# Patient Record
Sex: Male | Born: 1955 | Race: White | Hispanic: No | Marital: Married | Smoking: Current every day smoker
Health system: Southern US, Community
[De-identification: ages and names within clinical notes are randomized; demographics above are authoritative.]

## PROBLEM LIST (undated history)

## (undated) DIAGNOSIS — M1990 Unspecified osteoarthritis, unspecified site: Secondary | ICD-10-CM

## (undated) DIAGNOSIS — I872 Venous insufficiency (chronic) (peripheral): Secondary | ICD-10-CM

## (undated) DIAGNOSIS — N529 Male erectile dysfunction, unspecified: Secondary | ICD-10-CM

## (undated) DIAGNOSIS — E785 Hyperlipidemia, unspecified: Secondary | ICD-10-CM

## (undated) DIAGNOSIS — B059 Measles without complication: Secondary | ICD-10-CM

## (undated) DIAGNOSIS — I10 Essential (primary) hypertension: Secondary | ICD-10-CM

## (undated) DIAGNOSIS — R079 Chest pain, unspecified: Secondary | ICD-10-CM

## (undated) DIAGNOSIS — Z9289 Personal history of other medical treatment: Secondary | ICD-10-CM

## (undated) DIAGNOSIS — B019 Varicella without complication: Secondary | ICD-10-CM

## (undated) DIAGNOSIS — Z972 Presence of dental prosthetic device (complete) (partial): Secondary | ICD-10-CM

## (undated) DIAGNOSIS — B269 Mumps without complication: Secondary | ICD-10-CM

## (undated) DIAGNOSIS — F1910 Other psychoactive substance abuse, uncomplicated: Secondary | ICD-10-CM

## (undated) HISTORY — DX: Mumps without complication: B26.9

## (undated) HISTORY — DX: Chest pain, unspecified: R07.9

## (undated) HISTORY — DX: Male erectile dysfunction, unspecified: N52.9

## (undated) HISTORY — DX: Varicella without complication: B01.9

## (undated) HISTORY — DX: Essential (primary) hypertension: I10

## (undated) HISTORY — DX: Venous insufficiency (chronic) (peripheral): I87.2

## (undated) HISTORY — DX: Personal history of other medical treatment: Z92.89

## (undated) HISTORY — DX: Other psychoactive substance abuse, uncomplicated: F19.10

## (undated) HISTORY — DX: Hyperlipidemia, unspecified: E78.5

## (undated) HISTORY — DX: Measles without complication: B05.9

---
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
## 2005-01-16 ENCOUNTER — Ambulatory Visit: Payer: Self-pay

## 2007-11-21 ENCOUNTER — Emergency Department: Payer: Self-pay | Admitting: Emergency Medicine

## 2015-03-28 ENCOUNTER — Ambulatory Visit (INDEPENDENT_AMBULATORY_CARE_PROVIDER_SITE_OTHER): Payer: No Typology Code available for payment source | Admitting: Family Medicine

## 2015-03-28 ENCOUNTER — Encounter: Payer: Self-pay | Admitting: Family Medicine

## 2015-03-28 VITALS — BP 142/100 | HR 76 | Temp 98.5°F | Resp 16 | Ht 73.0 in | Wt 242.0 lb

## 2015-03-28 DIAGNOSIS — Z23 Encounter for immunization: Secondary | ICD-10-CM | POA: Diagnosis not present

## 2015-03-28 DIAGNOSIS — Z720 Tobacco use: Secondary | ICD-10-CM | POA: Insufficient documentation

## 2015-03-28 DIAGNOSIS — N529 Male erectile dysfunction, unspecified: Secondary | ICD-10-CM

## 2015-03-28 DIAGNOSIS — R9431 Abnormal electrocardiogram [ECG] [EKG]: Secondary | ICD-10-CM

## 2015-03-28 DIAGNOSIS — R079 Chest pain, unspecified: Secondary | ICD-10-CM | POA: Diagnosis not present

## 2015-03-28 DIAGNOSIS — R030 Elevated blood-pressure reading, without diagnosis of hypertension: Secondary | ICD-10-CM | POA: Diagnosis not present

## 2015-03-28 DIAGNOSIS — L409 Psoriasis, unspecified: Secondary | ICD-10-CM | POA: Diagnosis not present

## 2015-03-28 DIAGNOSIS — Z0000 Encounter for general adult medical examination without abnormal findings: Secondary | ICD-10-CM | POA: Diagnosis not present

## 2015-03-28 DIAGNOSIS — IMO0001 Reserved for inherently not codable concepts without codable children: Secondary | ICD-10-CM

## 2015-03-28 NOTE — Patient Instructions (Signed)
Recommend taking 81mg  enteric coated aspirin to reduce risk of vascular events such as heart attacks and strokes.   Screening for lung cancer is recommended for people between 67 and 58 years of age who have smoked 1 pack per day for at least 31 years. Please call our office at [phone] to schedule a low dose CT lung scan for lung cancer screening.

## 2015-03-28 NOTE — Progress Notes (Signed)
Patient: Chase [name], Male    DOB: Nov 17, 1955, 59 y.o.   MRN: [id number] Visit Date: 03/28/2015  Today's Provider: Lelon Huh, MD   Chief Complaint  Patient presents with  . Establish Care  . Erectile Dysfunction   Subjective:    Annual physical exam Chase [name] is a 59 y.o. male who presents today for health maintenance and complete physical. He feels fairly well. He reports exercising rarely. He reports he is sleeping fairly well. -----------------------------------------------------------------  Chest Pain  This is a new problem. The current episode started more than 1 month ago. The onset quality is gradual. The problem occurs every several days. The problem has been unchanged. The pain is present in the substernal region. The pain is at a severity of 3/10. The pain is moderate. The quality of the pain is described as heavy. The pain does not radiate. Associated symptoms include back pain, a cough, dizziness and palpitations. Pertinent negatives include no abdominal pain, diaphoresis, fever, nausea, near-syncope, orthopnea or shortness of breath.     Erectile Dysfunction:  Patient complains of erectile dysfunction.  Onset of dysfunction was several months ago and was gradual in onset.  Patient states the nature of difficulty is maintaining erection. Libido is affected.     Review of Systems  Constitutional: Negative for fever, diaphoresis and fatigue.  HENT: Negative for ear discharge, ear pain and hearing loss.   Eyes: Positive for pain. Negative for discharge, redness and itching.  Respiratory: Positive for cough, chest tightness, wheezing and stridor. Negative for shortness of breath.   Cardiovascular: Positive for chest pain, palpitations and leg swelling. Negative for orthopnea and near-syncope.  Gastrointestinal: Negative for nausea, abdominal pain, diarrhea, constipation and blood in stool.  Musculoskeletal: Positive for back pain, neck pain and neck stiffness.  Skin:        Neurological: Positive for dizziness, tremors and light-headedness.  Hematological: Negative for adenopathy. Does not bruise/bleed easily.  Psychiatric/Behavioral: Negative for sleep disturbance.    Social History He  reports that he has been smoking Cigarettes.  He started smoking about 45 years ago. He has a 90 pack-year smoking history. He does not have any smokeless tobacco history on file. He reports that he drinks alcohol. He reports that he does not use illicit drugs.  There are no active problems to display for this patient.   Past Surgical History  Procedure Laterality Date  . Appendectomy  1972    Family History His family history includes Heart disease in his mother; Pancreatic cancer in his father.    Previous Medications   No medications on file    Patient Care Team: Birdie Sons, MD as PCP - General (Family Medicine)     Objective:   Vitals: BP 142/100 mmHg  Pulse 76  Temp(Src) 98.5 F (36.9 C) (Oral)  Resp 16  Ht 6\' 1"  (1.854 m)  Wt 242 lb (109.77 kg)  BMI 31.93 kg/m2   Physical Exam  General Appearance:    Alert, cooperative, no distress, appears stated age, obese  Head:    Normocephalic, without obvious abnormality, atraumatic  Eyes:    PERRL, conjunctiva/corneas clear, EOM's intact, fundi    benign, both eyes       Ears:    Normal TM's and external ear canals, both ears  Nose:   Nares normal, septum midline, mucosa normal, no drainage   or sinus tenderness  Throat:   Lips, mucosa, and tongue normal; teeth and gums normal  Neck:   Supple, symmetrical,  trachea midline, no adenopathy;       thyroid:  No enlargement/tenderness/nodules; no carotid   bruit or JVD  Back:     Symmetric, no curvature, ROM normal, no CVA tenderness  Lungs:     Clear to auscultation bilaterally, respirations unlabored  Chest wall:    No tenderness or deformity  Heart:    Regular rate and rhythm, S1 and S2 normal, no murmur, rub   or gallop  Abdomen:     Soft,  non-tender, bowel sounds active all four quadrants,    no masses, no organomegaly  Genitalia:    deferred  Rectal:    deferred  Extremities:   Extremities normal, atraumatic, no cyanosis or edema  Pulses:   2+ and symmetric all extremities  Skin:   Skin color, texture, turgor normal, no rashes or lesions except for psoriatic changes on elbows and knees.   Lymph nodes:   Cervical, supraclavicular, and axillary nodes normal  Neurologic:   CNII-XII intact. Normal strength, sensation and reflexes      throughout        Assessment & Plan:     Routine Health Maintenance and Physical Exam    Health Maintenance  Topic Date Due  . HIV Screening  12/13/1971  . TETANUS/TDAP  12/13/1975  . COLONOSCOPY  12/12/2006  . INFLUENZA VACCINE  05/21/2015      Discussed health benefits of physical activity, and encouraged him to engage in regular exercise appropriate for his age and condition.    --------------------------------------------------------------------   1. Annual physical exam  - EKG 27-NTZG - Basic metabolic panel - Lipid panel - TSH - PSA - Ambulatory referral to Gastroenterology  2. Elevated blood pressure See how labs and cardiology goes. Would probably benefit from ACEI if renal functions ar Ok.   3. Erectile dysfunction, unspecified erectile dysfunction type Likely secondary to smoking, hypertension, and suspected vascular disease.  - Testosterone,Free and Total  4. Tobacco abuse Stop smoking. Counseled regarding LDCT.   5. Chest pain, unspecified chest pain type Multiple cardiac risk factors. Start 81mg  ASA.  - Ambulatory referral to Cardiology  6. Abnormal EKG  - Ambulatory referral to Cardiology  7. Need for Tdap vaccination  - Tdap vaccine greater than or equal to 7yo IM

## 2015-03-29 LAB — TESTOSTERONE,FREE AND TOTAL
TESTOSTERONE FREE: 9.3 pg/mL (ref 7.2–24.0)
TESTOSTERONE: 247 ng/dL — AB (ref 348–1197)

## 2015-03-29 LAB — LIPID PANEL
CHOLESTEROL TOTAL: 226 mg/dL — AB (ref 100–199)
Chol/HDL Ratio: 4.8 ratio units (ref 0.0–5.0)
HDL: 47 mg/dL (ref >39)
LDL Calculated: 156 mg/dL — ABNORMAL HIGH (ref 0–99)
Triglycerides: 113 mg/dL (ref 0–149)
VLDL Cholesterol Cal: 23 mg/dL (ref 5–40)

## 2015-03-29 LAB — PSA: PROSTATE SPECIFIC AG, SERUM: 1 ng/mL (ref 0.0–4.0)

## 2015-03-29 LAB — TSH: TSH: 3.01 u[IU]/mL (ref 0.450–4.500)

## 2015-03-30 ENCOUNTER — Telehealth: Payer: Self-pay

## 2015-03-30 NOTE — Telephone Encounter (Signed)
EKG reviewed by Ignacia Bayley, NP. He is in agreeance that pt may wait until 7/19 for appt.

## 2015-03-30 NOTE — Telephone Encounter (Signed)
[facility] called, states pt has abnormal EKG. Please review EKG, closest appt is 7/19.

## 2015-03-31 LAB — RENAL FUNCTION PANEL
Albumin: 4.6 g/dL (ref 3.5–5.5)
BUN/Creatinine Ratio: 9 (ref 9–20)
BUN: 10 mg/dL (ref 6–24)
CO2: 24 mmol/L (ref 18–29)
CREATININE: 1.12 mg/dL (ref 0.76–1.27)
Calcium: 9.4 mg/dL (ref 8.7–10.2)
Chloride: 100 mmol/L (ref 97–108)
GFR calc Af Amer: 83 mL/min/{1.73_m2} (ref >59)
GFR calc non Af Amer: 72 mL/min/{1.73_m2} (ref >59)
GLUCOSE: 80 mg/dL (ref 65–99)
POTASSIUM: 4.7 mmol/L (ref 3.5–5.2)
Phosphorus: 4.7 mg/dL — ABNORMAL HIGH (ref 2.5–4.5)
Sodium: 141 mmol/L (ref 134–144)

## 2015-03-31 LAB — SPECIMEN STATUS REPORT

## 2015-04-03 ENCOUNTER — Telehealth: Payer: Self-pay | Admitting: *Deleted

## 2015-04-03 MED ORDER — LISINOPRIL 10 MG PO TABS: 10.0000 mg | ORAL_TABLET | Freq: Every day | ORAL | Status: DC

## 2015-04-03 NOTE — Telephone Encounter (Signed)
-----   Message from Birdie Sons, MD sent at 03/29/2015  9:28 AM EDT ----- Please add renal panel to labs drawn 03/29/15. Thanks.

## 2015-04-03 NOTE — Telephone Encounter (Signed)
Notes Recorded by [place] M Zoria Rawlinson, CMA on 04/03/2015 at 10:58 AM Patient notified of results. Expressed understanding. Rx sent to [facility]. Notes Recorded by Birdie Sons, MD on 04/01/2015 at 1:57 PM Testosterone levels are a little low, otherwise labs are normal. Recommend he start lisinopril 10mg  daily, #30, rf x 1 due to high blood pressure. Follow up 1 months for BP check. Notes Recorded by Wilder Glade, CMA on 03/30/2015 at 9:44 AM Added renal panel to specimen drawn.

## 2015-04-16 ENCOUNTER — Other Ambulatory Visit: Payer: Self-pay

## 2015-04-16 ENCOUNTER — Telehealth: Payer: Self-pay

## 2015-04-16 NOTE — Telephone Encounter (Signed)
Gastroenterology Pre-Procedure Review  Request Date: 05-21-15 Requesting Physician: Dr. Caryn Section  PATIENT REVIEW QUESTIONS: The patient responded to the following health history questions as indicated:    1. Are you having any GI issues? yes (hemorrhoids) 2. Do you have a personal history of Polyps? no 3. Do you have a family history of Colon Cancer or Polyps? no 4. Diabetes Mellitus? no 5. Joint replacements in the past 12 months?no 6. Major health problems in the past 3 months?no 7. Any artificial heart valves, MVP, or defibrillator?no    MEDICATIONS & ALLERGIES:    Patient reports the following regarding taking any anticoagulation/antiplatelet therapy:   Plavix, Coumadin, Eliquis, Xarelto, Lovenox, Pradaxa, Brilinta, or Effient? no Aspirin? yes (81mg )  Patient confirms/reports the following medications:  Current Outpatient Prescriptions  Medication Sig Dispense Refill  . Omega-3 Fatty Acids (FISH OIL) 1000 MG CAPS Take 1 capsule by mouth.    Marland Kitchen lisinopril (PRINIVIL,ZESTRIL) 10 MG tablet Take 1 tablet (10 mg total) by mouth daily. 30 tablet 1   No current facility-administered medications for this visit.    Patient confirms/reports the following allergies:  No Known Allergies  No orders of the defined types were placed in this encounter.    AUTHORIZATION INFORMATION Primary Insurance: 1D#: Group #:  Secondary Insurance: 1D#: Group #:  SCHEDULE INFORMATION: Date: 05-21-15 Time: Location: [facility]

## 2015-05-08 ENCOUNTER — Encounter: Payer: Self-pay | Admitting: Cardiovascular Disease

## 2015-05-08 ENCOUNTER — Encounter (INDEPENDENT_AMBULATORY_CARE_PROVIDER_SITE_OTHER): Payer: Self-pay

## 2015-05-08 ENCOUNTER — Ambulatory Visit (INDEPENDENT_AMBULATORY_CARE_PROVIDER_SITE_OTHER): Payer: No Typology Code available for payment source | Admitting: Cardiovascular Disease

## 2015-05-08 VITALS — BP 159/106 | HR 81 | Ht 73.0 in | Wt 244.1 lb

## 2015-05-08 DIAGNOSIS — I10 Essential (primary) hypertension: Secondary | ICD-10-CM | POA: Diagnosis not present

## 2015-05-08 DIAGNOSIS — E785 Hyperlipidemia, unspecified: Secondary | ICD-10-CM | POA: Diagnosis not present

## 2015-05-08 DIAGNOSIS — Z720 Tobacco use: Secondary | ICD-10-CM

## 2015-05-08 DIAGNOSIS — R079 Chest pain, unspecified: Secondary | ICD-10-CM | POA: Diagnosis not present

## 2015-05-08 MED ORDER — LISINOPRIL 40 MG PO TABS: 40.0000 mg | ORAL_TABLET | Freq: Every day | ORAL | Status: DC

## 2015-05-08 NOTE — Assessment & Plan Note (Signed)
Lab Results  Component Value Date   CHOL 226* 03/28/2015   HDL 47 03/28/2015   LDLCALC 156* 03/28/2015   TRIG 113 03/28/2015   CHOLHDL 4.8 03/28/2015   I discussed with him today the importance of decreasing carbohydrate and fat intake. He also consumes significant amount of beer daily which is likely contributing. I advised him to start an exercise program after we make sure that treadmill stress test is fine.

## 2015-05-08 NOTE — Progress Notes (Signed)
Primary care physician: Dr. Caryn Section.  HPI  This is a pleasant 59 year old male who was referred for evaluation of chest pain. He has no previous cardiac history. He has known history of hypertension, tobacco use, psoriasis, obesity, hyperlipidemia and erectile dysfunction. He has not seen a primary care physician in more than 25 years up until recently when he establish with Dr. Caryn Section. He was noted to have elevated blood pressure. He was started on small dose lisinopril 10 mg once daily. She had labs done which were unremarkable but he was found to have significant hyperlipidemia. Also testosterone was low. He was noted to have slightly abnormal ECG with nonspecific T wave changes. The patient complains of rare episodes of sharp chest pain described as twinges happening both at rest and with physical activities. He denies any chest tightness or heaviness. He smokes 2 packs per day for 40 years. He also drinks 6-8 beers a day. He does not exercise on a regular basis. He has no family history of premature coronary artery disease.  No Known Allergies   Current Outpatient Prescriptions on File Prior to Visit  Medication Sig Dispense Refill  . lisinopril (PRINIVIL,ZESTRIL) 10 MG tablet Take 1 tablet (10 mg total) by mouth daily. 30 tablet 1  . Omega-3 Fatty Acids (FISH OIL) 1000 MG CAPS Take 1 capsule by mouth.     No current facility-administered medications on file prior to visit.     Past Medical History  Diagnosis Date  . Chest pain   . Erectile dysfunction   . Chicken pox   . Mumps   . Measles      Past Surgical History  Procedure Laterality Date  . Appendectomy  1972     Family History  Problem Relation Age of Onset  . Heart disease Mother   . Pancreatic cancer Father      History   Social History  . Marital Status: Married    Spouse Name: N/A  . Number of Children: N/A  . Years of Education: N/A   Occupational History  . Full-time    Social History Main  Topics  . Smoking status: Current Every Day Smoker -- 2.00 packs/day for 45 years    Types: Cigarettes    Start date: 03/27/1970  . Smokeless tobacco: Not on file  . Alcohol Use: 0.0 oz/week    0 Standard drinks or equivalent per week  . Drug Use: No  . Sexual Activity: Not on file   Other Topics Concern  . Not on file   Social History Narrative     ROS A 10 point review of system was performed. It is negative other than that mentioned in the history of present illness.   PHYSICAL EXAM   BP 159/106 mmHg  Pulse 81  Ht 6\' 1"  (1.854 m)  Wt 244 lb 1.9 oz (110.732 kg)  BMI 32.21 kg/m2 Constitutional: He is oriented to person, place, and time. He appears well-developed and well-nourished. No distress.  HENT: No nasal discharge.  Head: Normocephalic and atraumatic.  Eyes: Pupils are equal and round.  No discharge. Neck: Normal range of motion. Neck supple. No JVD present. No thyromegaly present.  Cardiovascular: Normal rate, regular rhythm, normal heart sounds. Exam reveals no gallop and no friction rub. There is 1/ 6 systolic ejection murmur at the aortic area.  Pulmonary/Chest: Effort normal and breath sounds normal. No stridor. No respiratory distress. He has no wheezes. He has no rales. He exhibits no tenderness.  Abdominal: Soft. Bowel  sounds are normal. He exhibits no distension. There is no tenderness. There is no rebound and no guarding.  Musculoskeletal: Normal range of motion. He exhibits no edema and no tenderness.  Neurological: He is alert and oriented to person, place, and time. Coordination normal.  Skin: Skin is warm and dry. Extensive rash due to psoriasis. He is not diaphoretic. No erythema. No pallor.  Psychiatric: He has a normal mood and affect. His behavior is normal. Judgment and thought content normal.       EKG: Normal sinus rhythm with no significant ST or T wave changes.   ASSESSMENT AND PLAN

## 2015-05-08 NOTE — Assessment & Plan Note (Signed)
The chest pain is overall atypical and likely musculoskeletal. Initial EKG showed nonspecific T wave changes. However, today's EKG is normal. He has multiple risk factors for coronary artery disease and thus I scheduled a treadmill stress test. I discussed with the patient the importance of lifestyle changes in order to decrease the chance of future coronary artery disease and cardiovascular events. We discussed the importance of controlling risk factors, healthy diet as well as regular exercise. I also explained to him that a normal stress test does not rule out atherosclerosis.

## 2015-05-08 NOTE — Assessment & Plan Note (Signed)
Blood pressure continues to be elevated. I discussed with him the importance of diet and exercise. I increased the dose of lisinopril to 40 mg once daily.

## 2015-05-08 NOTE — Assessment & Plan Note (Signed)
I discussed with him the importance of smoking cessation. 

## 2015-05-08 NOTE — Patient Instructions (Signed)
Medication Instructions:  Your physician has recommended you make the following change in your medication:  INCREASE lisinopril to 40mg  once per day   Labwork: none  Testing/Procedures: Your physician has requested that you have an exercise tolerance test.    Follow-Up: Your physician recommends that you schedule a follow-up appointment as needed with Dr. Fletcher Anon.    Any Other Special Instructions Will Be Listed Below (If Applicable).  Exercise Stress Electrocardiogram An exercise stress electrocardiogram is a test to check how blood flows to your heart. It is done to find areas of poor blood flow. You will need to walk on a treadmill for this test. The electrocardiogram will record your heartbeat when you are at rest and when you are exercising. BEFORE THE PROCEDURE  Do not have drinks with caffeine or foods with caffeine for 24 hours before the test, or as told by your doctor. This includes coffee, tea (even decaf tea), sodas, chocolate, and cocoa.  Follow your doctor's instructions about eating and drinking before the test.  Ask your doctor what medicines you should or should not take before the test. Take your medicines with water unless told by your doctor not to.  If you use an inhaler, bring it with you to the test.  Bring a snack to eat after the test.  Do not  smoke for 4 hours before the test.  Do not put lotions, powders, creams, or oils on your chest before the test.  Wear comfortable shoes and clothing. PROCEDURE  You will have patches put on your chest. Small areas of your chest may need to be shaved. Wires will be connected to the patches.  Your heart rate will be watched while you are resting and while you are exercising.  You will walk on the treadmill. The treadmill will slowly get faster to raise your heart rate.  The test will take about 1-2 hours. AFTER THE PROCEDURE  Your heart rate and blood pressure will be watched after the test.  You may  return to your normal diet, activities, and medicines or as told by your doctor. Document Released: 03/24/2008 Document Revised: 02/20/2014 Document Reviewed: 06/13/2013 [facility] Patient Information 2015 [place], [place]. This information is not intended to replace advice given to you by your health care provider. Make sure you discuss any questions you have with your health care provider.

## 2015-05-11 ENCOUNTER — Ambulatory Visit: Payer: Self-pay | Admitting: Cardiovascular Disease

## 2015-05-14 ENCOUNTER — Ambulatory Visit: Payer: Self-pay | Admitting: Cardiovascular Disease

## 2015-05-16 ENCOUNTER — Encounter: Payer: Self-pay | Admitting: *Deleted

## 2015-05-16 NOTE — Discharge Instructions (Signed)

## 2015-05-17 ENCOUNTER — Encounter: Payer: Self-pay | Admitting: Anesthesiology

## 2015-05-21 ENCOUNTER — Ambulatory Visit
Admission: RE | Admit: 2015-05-21 | Payer: No Typology Code available for payment source | Source: Ambulatory Visit | Admitting: Gastroenterology

## 2015-05-21 ENCOUNTER — Encounter: Admission: RE | Payer: Self-pay | Source: Ambulatory Visit

## 2015-05-21 ENCOUNTER — Telehealth: Payer: Self-pay

## 2015-05-21 HISTORY — DX: Presence of dental prosthetic device (complete) (partial): Z97.2

## 2015-05-21 HISTORY — DX: Unspecified osteoarthritis, unspecified site: M19.90

## 2015-05-21 SURGERY — COLONOSCOPY WITH PROPOFOL
Anesthesia: Choice

## 2015-05-25 ENCOUNTER — Other Ambulatory Visit: Payer: Self-pay | Admitting: Family Medicine

## 2015-06-14 ENCOUNTER — Ambulatory Visit (INDEPENDENT_AMBULATORY_CARE_PROVIDER_SITE_OTHER): Payer: No Typology Code available for payment source

## 2015-06-14 DIAGNOSIS — R079 Chest pain, unspecified: Secondary | ICD-10-CM | POA: Diagnosis not present

## 2015-06-15 LAB — EXERCISE TOLERANCE TEST
CSEPEW: 7 METS
Exercise duration (min): 5 min
Exercise duration (sec): 8 s
MPHR: 162 {beats}/min
Peak HR: 141 {beats}/min
Percent HR: 87 %
Rest HR: 80 {beats}/min

## 2015-06-19 NOTE — Telephone Encounter (Signed)
Waiting for pt to complete cardiac workup.

## 2015-07-02 ENCOUNTER — Telehealth: Payer: Self-pay | Admitting: Cardiovascular Disease

## 2015-07-02 NOTE — Telephone Encounter (Signed)
BP readings   06/24/15   Am 150/93 pm 149/90    06/25/15   Am 137/96  Pm  128/77 06/26/15   Am 147/98  Pm  121/79  06/27/15  Am 136/93   Pm 131/73  06/28/15  Am 139/96  Pm 131/83  06/29/15  Am 138/93   Pm 131/86 06/30/15  Am 134/90  Pm 118/74  07/01/15  Am 140/93  Pm 131/88 07/02/15  Am 130/89

## 2015-07-02 NOTE — Telephone Encounter (Signed)
S/w pt regarding BP readings. Per Dr. Tyrell Antonio result notes, pt should f/u w/ Dr. Caryn Section for possible additional BP med. States he will contact [facility] office. States he was to have colonoscopy which was cancelled until ETT results. Informed pt to have them contact our office if he needs clearance. Pt verbalized understanding with no further questions.

## 2015-07-11 ENCOUNTER — Telehealth: Payer: Self-pay | Admitting: Family Medicine

## 2015-07-11 NOTE — Telephone Encounter (Signed)
Please advise patient we received records Chase [name] regarding normal stress test, but BP still elevated He needs to schedule follow up office visit for BP check and follow up labs.

## 2015-07-12 NOTE — Telephone Encounter (Signed)
Patient notified and scheduled f/u appt for 07/16/2015 at 4:00 pm.

## 2015-07-16 ENCOUNTER — Ambulatory Visit (INDEPENDENT_AMBULATORY_CARE_PROVIDER_SITE_OTHER): Payer: No Typology Code available for payment source | Admitting: Family Medicine

## 2015-07-16 ENCOUNTER — Encounter: Payer: Self-pay | Admitting: Family Medicine

## 2015-07-16 VITALS — BP 118/64 | HR 72 | Temp 98.2°F | Resp 16 | Ht 73.0 in | Wt 233.0 lb

## 2015-07-16 DIAGNOSIS — I10 Essential (primary) hypertension: Secondary | ICD-10-CM | POA: Diagnosis not present

## 2015-07-16 DIAGNOSIS — E785 Hyperlipidemia, unspecified: Secondary | ICD-10-CM

## 2015-07-16 NOTE — Progress Notes (Signed)
Patient: Chase [name] Male    DOB: 21-Jul-1956   59 y.o.   MRN: [id number] Visit Date: 07/16/2015  Today's Provider: Lelon Huh, MD   Chief Complaint  Patient presents with  . Hypertension    follow up   Subjective:    HPI  Hypertension, follow-up:  BP Readings from Last 3 Encounters:  07/16/15 118/64  05/08/15 159/106  03/28/15 142/100    He was last seen for hypertension 3 months ago.  BP at that visit was  142/100. Management at that visit includes starting Lisinopril 10mg  daily. He was referred to Dr. Fletcher Anon for non-specific chest pain and had negative stress test. He states Dr. Fletcher Anon increased the Lisinopril to 40mg  daily. He reports good compliance with treatment. He is not having side effects.  He is not exercising. He is adherent to low salt diet.   Outside blood pressures are high per patient report. He is experiencing none.  Patient denies chest pain, chest pressure/discomfort, claudication, dyspnea, exertional chest pressure/discomfort, fatigue, irregular heart beat, lower extremity edema, near-syncope, orthopnea, palpitations, paroxysmal nocturnal dyspnea, syncope and tachypnea.   Cardiovascular risk factors include advanced age (older than 60 for men, 23 for women), hypertension, male gender, sedentary lifestyle and smoking/ tobacco exposure.  Use of agents associated with hypertension: NSAIDS.     Weight trend: decreasing steadily Wt Readings from Last 3 Encounters:  07/16/15 233 lb (105.688 kg)  05/08/15 244 lb 1.9 oz (110.732 kg)  05/16/15 244 lb (110.678 kg)    Current diet: in general, a "healthy" diet    ------------------------------------------------------------------------  He states he has really been working had to get cholesterol down by cutting way back on fatty foods and red meat. He is anxious to see how his cholesterol looks.  Lab Results  Component Value Date   CHOL 226* 03/28/2015   HDL 47 03/28/2015   LDLCALC 156* 03/28/2015    TRIG 113 03/28/2015   CHOLHDL 4.8 03/28/2015       No Known Allergies Previous Medications   ASPIRIN 81 MG TABLET    Take 81 mg by mouth daily.   CINNAMON PO    Take by mouth.   LISINOPRIL (PRINIVIL,ZESTRIL) 40 MG TABLET    Take 1 tablet (40 mg total) by mouth daily.   MULTIPLE VITAMIN (MULTIVITAMIN) CAPSULE    Take 1 capsule by mouth daily.   OMEGA-3 FATTY ACIDS (FISH OIL) 1000 MG CAPS    Take 1 capsule by mouth.    Review of Systems  Constitutional: Negative for fever, chills and appetite change.  Respiratory: Negative for chest tightness, shortness of breath and wheezing.   Cardiovascular: Negative for chest pain and palpitations.  Gastrointestinal: Negative for nausea, vomiting and abdominal pain.    Social History  Substance Use Topics  . Smoking status: Current Every Day Smoker -- 2.00 packs/day for 45 years    Types: Cigarettes    Start date: 03/27/1970  . Smokeless tobacco: Not on file  . Alcohol Use: 24.0 oz/week    40 Cans of beer, 0 Standard drinks or equivalent per week     Comment: drinks 8 beers daily   Objective:   BP 118/64 mmHg  Pulse 72  Temp(Src) 98.2 F (36.8 C) (Oral)  Resp 16  Ht 6\' 1"  (1.854 m)  Wt 233 lb (105.688 kg)  BMI 30.75 kg/m2  SpO2 96%  Physical Exam   General Appearance:    Alert, cooperative, no distress  Eyes:  PERRL, conjunctiva/corneas clear, EOM's intact       Lungs:     Clear to auscultation bilaterally, respirations unlabored  Heart:    Regular rate and rhythm  Neurologic:   Awake, alert, oriented x 3. No apparent focal neurological           defect.          Assessment & Plan:     1. Essential hypertension Tolerating lisinopril well without adverse effects.  - Renal function panel  2. Hyperlipidemia Doing well with diet.  - Lipid panel       Lelon Huh, MD  [facility]

## 2015-07-17 LAB — LIPID PANEL
Chol/HDL Ratio: 5.2 ratio units — ABNORMAL HIGH (ref 0.0–5.0)
Cholesterol, Total: 196 mg/dL (ref 100–199)
HDL: 38 mg/dL — ABNORMAL LOW (ref >39)
LDL Calculated: 122 mg/dL — ABNORMAL HIGH (ref 0–99)
TRIGLYCERIDES: 181 mg/dL — AB (ref 0–149)
VLDL Cholesterol Cal: 36 mg/dL (ref 5–40)

## 2015-07-17 LAB — RENAL FUNCTION PANEL
ALBUMIN: 4.4 g/dL (ref 3.5–5.5)
BUN/Creatinine Ratio: 13 (ref 9–20)
BUN: 12 mg/dL (ref 6–24)
CALCIUM: 9.4 mg/dL (ref 8.7–10.2)
CO2: 25 mmol/L (ref 18–29)
Chloride: 102 mmol/L (ref 97–108)
Creatinine, Ser: 0.93 mg/dL (ref 0.76–1.27)
GFR calc Af Amer: 104 mL/min/{1.73_m2} (ref >59)
GFR, EST NON AFRICAN AMERICAN: 90 mL/min/{1.73_m2} (ref >59)
GLUCOSE: 86 mg/dL (ref 65–99)
PHOSPHORUS: 4.9 mg/dL — AB (ref 2.5–4.5)
POTASSIUM: 4.7 mmol/L (ref 3.5–5.2)
Sodium: 142 mmol/L (ref 134–144)

## 2015-07-30 ENCOUNTER — Other Ambulatory Visit: Payer: Self-pay

## 2015-08-24 NOTE — Discharge Instructions (Signed)

## 2015-08-27 ENCOUNTER — Encounter
Admission: RE | Disposition: A | Discharge status: Home / Self Care | Payer: Self-pay | Source: Ambulatory Visit | Attending: Gastroenterology

## 2015-08-27 ENCOUNTER — Ambulatory Visit: Payer: No Typology Code available for payment source | Admitting: Anesthesiology

## 2015-08-27 ENCOUNTER — Other Ambulatory Visit: Payer: Self-pay | Admitting: Gastroenterology

## 2015-08-27 ENCOUNTER — Ambulatory Visit
Admission: RE | Admit: 2015-08-27 | Discharge: 2015-08-27 | Disposition: A | Discharge status: Home / Self Care | Payer: No Typology Code available for payment source | Source: Ambulatory Visit | Attending: Gastroenterology | Admitting: Gastroenterology

## 2015-08-27 DIAGNOSIS — Z1211 Encounter for screening for malignant neoplasm of colon: Secondary | ICD-10-CM | POA: Diagnosis not present

## 2015-08-27 DIAGNOSIS — I10 Essential (primary) hypertension: Secondary | ICD-10-CM | POA: Insufficient documentation

## 2015-08-27 DIAGNOSIS — K635 Polyp of colon: Secondary | ICD-10-CM | POA: Diagnosis not present

## 2015-08-27 DIAGNOSIS — Z79899 Other long term (current) drug therapy: Secondary | ICD-10-CM | POA: Diagnosis not present

## 2015-08-27 DIAGNOSIS — Z9049 Acquired absence of other specified parts of digestive tract: Secondary | ICD-10-CM | POA: Insufficient documentation

## 2015-08-27 DIAGNOSIS — Z7982 Long term (current) use of aspirin: Secondary | ICD-10-CM | POA: Diagnosis not present

## 2015-08-27 DIAGNOSIS — M1990 Unspecified osteoarthritis, unspecified site: Secondary | ICD-10-CM | POA: Insufficient documentation

## 2015-08-27 DIAGNOSIS — D125 Benign neoplasm of sigmoid colon: Secondary | ICD-10-CM | POA: Insufficient documentation

## 2015-08-27 DIAGNOSIS — F17210 Nicotine dependence, cigarettes, uncomplicated: Secondary | ICD-10-CM | POA: Insufficient documentation

## 2015-08-27 DIAGNOSIS — K621 Rectal polyp: Secondary | ICD-10-CM | POA: Insufficient documentation

## 2015-08-27 DIAGNOSIS — D123 Benign neoplasm of transverse colon: Secondary | ICD-10-CM | POA: Insufficient documentation

## 2015-08-27 DIAGNOSIS — D128 Benign neoplasm of rectum: Secondary | ICD-10-CM | POA: Diagnosis not present

## 2015-08-27 HISTORY — PX: COLONOSCOPY WITH PROPOFOL: SHX5780

## 2015-08-27 HISTORY — PX: POLYPECTOMY: SHX5525

## 2015-08-27 SURGERY — COLONOSCOPY WITH PROPOFOL
Anesthesia: Monitor Anesthesia Care | Wound class: Contaminated

## 2015-08-27 MED ORDER — LACTATED RINGERS IV SOLN
INTRAVENOUS | Status: DC
  Administered 2015-08-27: 10:00:00 via INTRAVENOUS

## 2015-08-27 MED ORDER — PROPOFOL 10 MG/ML IV BOLUS
INTRAVENOUS | Status: DC | PRN
  Administered 2015-08-27: 50 mg via INTRAVENOUS
  Administered 2015-08-27: 150 mg via INTRAVENOUS
  Administered 2015-08-27 (×6): 50 mg via INTRAVENOUS

## 2015-08-27 MED ORDER — SIMETHICONE 40 MG/0.6ML PO SUSP
ORAL | Status: DC | PRN
  Administered 2015-08-27: 10:00:00

## 2015-08-27 MED ORDER — SODIUM CHLORIDE 0.9 % IV SOLN: INTRAVENOUS | Status: DC

## 2015-08-27 MED ORDER — LIDOCAINE HCL (CARDIAC) 20 MG/ML IV SOLN
INTRAVENOUS | Status: DC | PRN
  Administered 2015-08-27: 50 mg via INTRAVENOUS

## 2015-08-27 SURGICAL SUPPLY — 28 items
CANISTER SUCT 1200ML W/VALVE (MISCELLANEOUS) ×4 IMPLANT
FCP ESCP3.2XJMB 240X2.8X (MISCELLANEOUS)
FORCEPS BIOP RAD 4 LRG CAP 4 (CUTTING FORCEPS) IMPLANT
FORCEPS BIOP RJ4 240 W/NDL (MISCELLANEOUS)
FORCEPS ESCP3.2XJMB 240X2.8X (MISCELLANEOUS) IMPLANT
GOWN CVR UNV OPN BCK APRN NK (MISCELLANEOUS) ×4 IMPLANT
GOWN ISOL THUMB LOOP REG UNIV (MISCELLANEOUS) ×4
HEMOCLIP INSTINCT (CLIP) IMPLANT
INJECTOR VARIJECT VIN23 (MISCELLANEOUS) IMPLANT
KIT CO2 TUBING (TUBING) IMPLANT
KIT DEFENDO VALVE AND CONN (KITS) IMPLANT
KIT ENDO PROCEDURE OLY (KITS) ×4 IMPLANT
LIGATOR MULTIBAND 6SHOOTER MBL (MISCELLANEOUS) IMPLANT
MARKER SPOT ENDO TATTOO 5ML (MISCELLANEOUS) IMPLANT
PAD GROUND ADULT SPLIT (MISCELLANEOUS) ×4 IMPLANT
SNARE SHORT THROW 13M SML OVAL (MISCELLANEOUS) ×4 IMPLANT
SNARE SHORT THROW 30M LRG OVAL (MISCELLANEOUS) IMPLANT
SPOT EX ENDOSCOPIC TATTOO (MISCELLANEOUS)
SUCTION POLY TRAP 4CHAMBER (MISCELLANEOUS) IMPLANT
TRAP SUCTION POLY (MISCELLANEOUS) ×8 IMPLANT
TUBING CONN 6MMX3.1M (TUBING)
TUBING SUCTION CONN 0.25 STRL (TUBING) IMPLANT
UNDERPAD 30X60 958B10 (PK) (MISCELLANEOUS) IMPLANT
VALVE BIOPSY ENDO (VALVE) IMPLANT
VARIJECT INJECTOR VIN23 (MISCELLANEOUS)
WATER AUXILLARY (MISCELLANEOUS) IMPLANT
WATER STERILE IRR 250ML POUR (IV SOLUTION) ×4 IMPLANT
WATER STERILE IRR 500ML POUR (IV SOLUTION) IMPLANT

## 2015-08-27 NOTE — Transfer of Care (Signed)
Immediate Anesthesia Transfer of Care Note  Patient: Chase [name]  Procedure(s) Performed: Procedure(s): COLONOSCOPY WITH PROPOFOL (N/A) POLYPECTOMY  Patient Location: PACU  Anesthesia Type: MAC  Level of Consciousness: awake, alert  and patient cooperative  Airway and Oxygen Therapy: Patient Spontanous Breathing and Patient connected to supplemental oxygen  Post-op Assessment: Post-op Vital signs reviewed, Patient's Cardiovascular Status Stable, Respiratory Function Stable, Patent Airway and No signs of Nausea or vomiting  Post-op Vital Signs: Reviewed and stable  Complications: No apparent anesthesia complications

## 2015-08-27 NOTE — H&P (Signed)
  [facility]  [address] [place], [place] [address] Phone: [phone] Fax : [phone]  Primary Care Physician:  Lelon Huh, MD Primary Gastroenterologist:  Dr. Allen Norris  Pre-Procedure History & Physical: HPI:  Chase [name] is a 59 y.o. male is here for a screening colonoscopy.   Past Medical History  Diagnosis Date  . Chest pain   . Erectile dysfunction   . Chicken pox   . Mumps   . Measles   . Wears dentures     full upper  . Hypertension     CONTROLLED ON MEDS  . Arthritis     "maybe" - hands  . Wears dentures     UPPER    Past Surgical History  Procedure Laterality Date  . Appendectomy  1972    Prior to Admission medications   Medication Sig Start Date End Date Taking? Authorizing Provider  aspirin 81 MG tablet Take 81 mg by mouth daily. AM   Yes Historical Provider, MD  CINNAMON PO Take by mouth. AM   Yes Historical Provider, MD  lisinopril (PRINIVIL,ZESTRIL) 40 MG tablet Take 1 tablet (40 mg total) by mouth daily. Patient taking differently: Take 40 mg by mouth daily. AM 05/08/15  Yes Wellington Hampshire, MD  Multiple Vitamin (MULTIVITAMIN) capsule Take 1 capsule by mouth daily. AM   Yes Historical Provider, MD  Omega-3 Fatty Acids (FISH OIL) 1000 MG CAPS Take 1 capsule by mouth.   Yes Historical Provider, MD    Allergies as of 07/30/2015  . (No Known Allergies)    Family History  Problem Relation Age of Onset  . Heart disease Mother   . Pancreatic cancer Father     Social History   Social History  . Marital Status: Married    Spouse Name: N/A  . Number of Children: N/A  . Years of Education: N/A   Occupational History  . Full-time     Works in Chief Financial Officer at [employer]  . Smoking status: Current Every Day Smoker -- 2.00 packs/day for 45 years    Types: Cigarettes    Start date: 03/27/1970  . Smokeless tobacco: Never Used  . Alcohol Use: 18.0 oz/week    0 Standard drinks or equivalent,  30 Cans of beer per week     Comment: drinks 4 beers daily  . Drug Use: No  . Sexual Activity: Not on file   Other Topics Concern  . Not on file   Social History Narrative    Review of Systems: See HPI, otherwise negative ROS  Physical Exam: BP 173/100 mmHg  Pulse 97  Temp(Src) 97.9 F (36.6 C)  Resp 16  Ht 6\' 1"  (1.854 m)  Wt 224 lb (101.606 kg)  BMI 29.56 kg/m2  SpO2 98% General:   Alert,  pleasant and cooperative in NAD Head:  Normocephalic and atraumatic. Neck:  Supple; no masses or thyromegaly. Lungs:  Clear throughout to auscultation.    Heart:  Regular rate and rhythm. Abdomen:  Soft, nontender and nondistended. Normal bowel sounds, without guarding, and without rebound.   Neurologic:  Alert and  oriented x4;  grossly normal neurologically.  Impression/Plan: Chase [name] is now here to undergo a screening colonoscopy.  Risks, benefits, and alternatives regarding colonoscopy have been reviewed with the patient.  Questions have been answered.  All parties agreeable.

## 2015-08-27 NOTE — Anesthesia Postprocedure Evaluation (Signed)
  Anesthesia Post-op Note  Patient: Chase [name]  Procedure(s) Performed: Procedure(s): COLONOSCOPY WITH PROPOFOL (N/A) POLYPECTOMY  Anesthesia type:MAC  Patient location: PACU  Post pain: Pain level controlled  Post assessment: Post-op Vital signs reviewed, Patient's Cardiovascular Status Stable, Respiratory Function Stable, Patent Airway and No signs of Nausea or vomiting  Post vital signs: Reviewed and stable  Last Vitals:  Filed Vitals:   08/27/15 1015  BP: 116/71  Pulse: 92  Temp:   Resp: 19    Level of consciousness: awake, alert  and patient cooperative  Complications: No apparent anesthesia complications

## 2015-08-27 NOTE — Op Note (Signed)
[facility] Gastroenterology Patient Name: Chase [name] Procedure Date: 08/27/2015 9:44 AM MRN: [id number] Account #: [id number] Date of Birth: 29-Jun-1956 Admit Type: Outpatient Age: 59 Room: [facility] OR ROOM 01 Gender: Male Note Status: Finalized Procedure:         Colonoscopy Indications:       Screening for colorectal malignant neoplasm Providers:         Lucilla Lame, MD Referring MD:      Kirstie Peri. Caryn Section, MD (Referring MD) Medicines:         Propofol per Anesthesia Complications:     No immediate complications. Procedure:         Pre-Anesthesia Assessment:                    - Prior to the procedure, a History and Physical was                     performed, and patient medications and allergies were                     reviewed. The patient's tolerance of previous anesthesia                     was also reviewed. The risks and benefits of the procedure                     and the sedation options and risks were discussed with the                     patient. All questions were answered, and informed consent                     was obtained. Prior Anticoagulants: The patient has taken                     no previous anticoagulant or antiplatelet agents. ASA                     Grade Assessment: II - A patient with mild systemic                     disease. After reviewing the risks and benefits, the                     patient was deemed in satisfactory condition to undergo                     the procedure.                    After obtaining informed consent, the colonoscope was                     passed under direct vision. Throughout the procedure, the                     patient's blood pressure, pulse, and oxygen saturations                     were monitored continuously. The Olympus CF H180AL                     colonoscope (S#: [id number]) was introduced through the anus  and advanced to the the cecum, identified by appendiceal          orifice and ileocecal valve. The colonoscopy was performed                     without difficulty. The patient tolerated the procedure                     well. The quality of the bowel preparation was excellent. Findings:      The perianal and digital rectal examinations were normal.      A 3 mm polyp was found in the transverse colon. The polyp was sessile.       The polyp was removed with a cold biopsy forceps. Resection and       retrieval were complete.      Three sessile polyps were found in the sigmoid colon. The polyps were 5       to 8 mm in size. These polyps were removed with a hot snare. Resection       and retrieval were complete.      A 6 mm polyp was found in the sigmoid colon. The polyp was sessile. The       polyp was removed with a cold snare. Resection and retrieval were       complete.      A 9 mm polyp was found in the rectum. The polyp was sessile. The polyp       was removed with a cold snare. Resection and retrieval were complete. Impression:        - One 3 mm polyp in the transverse colon. Resected and                     retrieved.                    - Three 5 to 8 mm polyps in the sigmoid colon. Resected                     and retrieved.                    - One 6 mm polyp in the sigmoid colon. Resected and                     retrieved.                    - One 9 mm polyp in the rectum. Resected and retrieved. Recommendation:    - Await pathology results.                    - Repeat colonoscopy in 3 years if polyp adenoma and 10                     years if hyperplastic Procedure Code(s): --- Professional ---                    [phone], Colonoscopy, flexible; with removal of tumor(s),                     polyp(s), or other lesion(s) by snare technique                    24580, 67, Colonoscopy, flexible; with biopsy, single or  multiple Diagnosis Code(s): --- Professional ---                    Z12.11, Encounter for screening for  malignant neoplasm of                     colon                    D12.3, Benign neoplasm of transverse colon                    D12.5, Benign neoplasm of sigmoid colon                    K62.1, Rectal polyp CPT copyright 2014 American Medical Association. All rights reserved. The codes documented in this report are preliminary and upon coder review may  be revised to meet current compliance requirements. Lucilla Lame, MD 08/27/2015 10:10:00 AM This report has been signed electronically. Number of Addenda: 0 Note Initiated On: 08/27/2015 9:44 AM Scope Withdrawal Time: 0 hours 13 minutes 27 seconds  Total Procedure Duration: 0 hours 16 minutes 28 seconds       [facility]

## 2015-08-27 NOTE — Anesthesia Procedure Notes (Signed)
Procedure Name: MAC Performed by: Chevelle Durr Pre-anesthesia Checklist: Patient identified, Emergency Drugs available, Suction available, Timeout performed and Patient being monitored Patient Re-evaluated:Patient Re-evaluated prior to inductionOxygen Delivery Method: Nasal cannula Placement Confirmation: positive ETCO2     

## 2015-08-27 NOTE — Anesthesia Preprocedure Evaluation (Signed)
Anesthesia Evaluation  Patient identified by MRN, date of birth, ID band Patient awake    Airway Mallampati: II  TM Distance: >3 FB Neck ROM: Full    Dental  (+) Upper Dentures   Pulmonary Current Smoker,           Cardiovascular hypertension, Pt. on medications      Neuro/Psych    GI/Hepatic   Endo/Other    Renal/GU      Musculoskeletal   Abdominal   Peds  Hematology   Anesthesia Other Findings   Reproductive/Obstetrics                             Anesthesia Physical Anesthesia Plan  ASA: II  Anesthesia Plan: MAC   Post-op Pain Management:    Induction: Intravenous  Airway Management Planned:   Additional Equipment:   Intra-op Plan:   Post-operative Plan:   Informed Consent: I have reviewed the patients History and Physical, chart, labs and discussed the procedure including the risks, benefits and alternatives for the proposed anesthesia with the patient or authorized representative who has indicated his/her understanding and acceptance.     Plan Discussed with: CRNA  Anesthesia Plan Comments:         Anesthesia Quick Evaluation

## 2015-08-28 ENCOUNTER — Encounter: Payer: Self-pay | Admitting: Gastroenterology

## 2015-08-29 ENCOUNTER — Encounter: Payer: Self-pay | Admitting: Gastroenterology

## 2015-09-10 ENCOUNTER — Encounter: Payer: Self-pay | Admitting: Family Medicine

## 2015-09-10 ENCOUNTER — Ambulatory Visit (INDEPENDENT_AMBULATORY_CARE_PROVIDER_SITE_OTHER): Payer: No Typology Code available for payment source | Admitting: Family Medicine

## 2015-09-10 DIAGNOSIS — J321 Chronic frontal sinusitis: Secondary | ICD-10-CM

## 2015-09-10 MED ORDER — AMOXICILLIN 500 MG PO CAPS: 1000.0000 mg | ORAL_CAPSULE | Freq: Two times a day (BID) | ORAL | Status: AC

## 2015-09-10 NOTE — Progress Notes (Signed)
       Patient: Chase [name] Male    DOB: 1955-12-20   59 y.o.   MRN: [id number] Visit Date: 09/10/2015  Today's Provider: Lelon Huh, MD   Chief Complaint  Patient presents with  . Cough   Subjective:    Cough This is a new problem. The current episode started in the past 7 days. The problem has been gradually worsening. The cough is productive of sputum. Associated symptoms include ear pain, headaches, nasal congestion, postnasal drip, shortness of breath and wheezing. Pertinent negatives include no chest pain, ear congestion, fever or sore throat. Associated symptoms comments: Ear popping. Exacerbated by: heated room. Treatments tried: mucinex. The treatment provided mild relief.      No Known Allergies Previous Medications   ASPIRIN 81 MG TABLET    Take 81 mg by mouth daily. AM   CINNAMON PO    Take by mouth. AM   LISINOPRIL (PRINIVIL,ZESTRIL) 40 MG TABLET    Take 1 tablet (40 mg total) by mouth daily.   MULTIPLE VITAMIN (MULTIVITAMIN) CAPSULE    Take 1 capsule by mouth daily. AM   OMEGA-3 FATTY ACIDS (FISH OIL) 1000 MG CAPS    Take 1 capsule by mouth.    Review of Systems  Constitutional: Negative for fever.  HENT: Positive for congestion, ear pain, postnasal drip, sinus pressure and sneezing. Negative for sore throat.   Respiratory: Positive for cough, chest tightness, shortness of breath and wheezing.   Cardiovascular: Negative for chest pain and palpitations.  Neurological: Positive for headaches.    Social History  Substance Use Topics  . Smoking status: Current Every Day Smoker -- 2.00 packs/day for 45 years    Types: Cigarettes    Start date: 03/27/1970  . Smokeless tobacco: Never Used  . Alcohol Use: 18.0 oz/week    0 Standard drinks or equivalent, 30 Cans of beer per week     Comment: drinks 4 beers daily   Objective:   BP 116/60 mmHg  Pulse 95  Temp(Src) 98.3 F (36.8 C) (Oral)  Resp 16  Ht 6\' 1"  (1.854 m)  Wt 232 lb (105.235 kg)  BMI 30.62 kg/m2   SpO2 96%  Physical Exam  General Appearance:    Alert, cooperative, no distress  HENT:   ENT exam normal, no neck nodes or sinus tenderness, neck without nodes, throat normal without erythema or exudate, frontal and maxillary sinus tender and nasal mucosa congested  Eyes:    PERRL, conjunctiva/corneas clear, EOM's intact       Lungs:     Clear to auscultation bilaterally, respirations unlabored  Heart:    Regular rate and rhythm  Neurologic:   Awake, alert, oriented x 3. No apparent focal neurological           defect.           Assessment & Plan:           Lelon Huh, MD  [facility]

## 2015-11-08 ENCOUNTER — Other Ambulatory Visit: Payer: Self-pay | Admitting: Cardiovascular Disease

## 2016-03-08 ENCOUNTER — Other Ambulatory Visit: Payer: Self-pay | Admitting: Cardiovascular Disease

## 2016-03-10 ENCOUNTER — Telehealth: Payer: Self-pay | Admitting: Cardiovascular Disease

## 2016-03-10 NOTE — Telephone Encounter (Signed)
Tried to call patient He was only given 18m refills and needs to come see Dr Fletcher Anon  But vm was not set up  Will try again later.

## 2016-04-07 ENCOUNTER — Other Ambulatory Visit: Payer: Self-pay | Admitting: Cardiovascular Disease

## 2016-04-08 ENCOUNTER — Encounter: Payer: Self-pay | Admitting: Physician Assistant

## 2016-04-08 DIAGNOSIS — R0789 Other chest pain: Secondary | ICD-10-CM | POA: Insufficient documentation

## 2016-04-08 DIAGNOSIS — F1010 Alcohol abuse, uncomplicated: Secondary | ICD-10-CM | POA: Insufficient documentation

## 2016-04-09 ENCOUNTER — Encounter (INDEPENDENT_AMBULATORY_CARE_PROVIDER_SITE_OTHER): Payer: Self-pay

## 2016-04-09 ENCOUNTER — Other Ambulatory Visit: Payer: Self-pay

## 2016-04-09 ENCOUNTER — Ambulatory Visit (INDEPENDENT_AMBULATORY_CARE_PROVIDER_SITE_OTHER): Payer: Managed Care, Other (non HMO)

## 2016-04-09 ENCOUNTER — Encounter: Payer: Self-pay | Admitting: Physician Assistant

## 2016-04-09 ENCOUNTER — Ambulatory Visit (INDEPENDENT_AMBULATORY_CARE_PROVIDER_SITE_OTHER): Payer: Managed Care, Other (non HMO) | Admitting: Physician Assistant

## 2016-04-09 VITALS — BP 120/70 | HR 70 | Ht 73.0 in | Wt 230.5 lb

## 2016-04-09 DIAGNOSIS — R0602 Shortness of breath: Secondary | ICD-10-CM

## 2016-04-09 DIAGNOSIS — E785 Hyperlipidemia, unspecified: Secondary | ICD-10-CM

## 2016-04-09 DIAGNOSIS — Z720 Tobacco use: Secondary | ICD-10-CM | POA: Diagnosis not present

## 2016-04-09 DIAGNOSIS — E669 Obesity, unspecified: Secondary | ICD-10-CM | POA: Diagnosis not present

## 2016-04-09 DIAGNOSIS — M7989 Other specified soft tissue disorders: Secondary | ICD-10-CM

## 2016-04-09 DIAGNOSIS — I10 Essential (primary) hypertension: Secondary | ICD-10-CM | POA: Diagnosis not present

## 2016-04-09 DIAGNOSIS — F1010 Alcohol abuse, uncomplicated: Secondary | ICD-10-CM

## 2016-04-09 LAB — ECHOCARDIOGRAM COMPLETE
AOPV: 0.46 m/s
AV Area VTI index: 0.79 cm2/m2
AV Area mean vel: 1.69 cm2
AV Mean grad: 10 mmHg
AV Peak grad: 20 mmHg
AV peak Index: 0.74
AV pk vel: 223 cm/s
AVAREAMEANVIN: 0.72 cm2/m2
AVAREAVTI: 1.74 cm2
AVCELMEANRAT: 0.45
CHL CUP AV VEL: 1.86
CHL CUP DOP CALC LVOT VTI: 22.9 cm
CHL CUP MV DEC (S): 141
CHL CUP STROKE VOLUME: 60 mL
DOP CAL AO MEAN VELOCITY: 150 cm/s
E decel time: 141 msec
E/e' ratio: 8.29
FS: 34 % (ref 28–44)
HEIGHTINCHES: 73 in
IVS/LV PW RATIO, ED: 1.06
LA ID, A-P, ES: 32 mm
LA vol A4C: 54.5 ml
LADIAMINDEX: 1.36 cm/m2
LAVOL: 63.5 mL
LAVOLIN: 27.1 mL/m2
LDCA: 3.8 cm2
LEFT ATRIUM END SYS DIAM: 32 mm
LV E/e' medial: 8.29
LV E/e'average: 8.29
LV SIMPSON'S DISK: 62
LV dias vol index: 41 mL/m2
LV dias vol: 96 mL (ref 62–150)
LV e' LATERAL: 12.3 cm/s
LV sys vol index: 15 mL/m2
LVOT SV: 87 mL
LVOTD: 22 mm
LVOTPV: 102 cm/s
LVOTVTI: 0.49 cm
LVSYSVOL: 36 mL (ref 21–61)
MV Peak grad: 4 mmHg
MVPKAVEL: 62.9 m/s
MVPKEVEL: 102 m/s
P 1/2 time: 1838 ms
PW: 11.9 mm — AB (ref 0.6–1.1)
TAPSE: 24.9 mm
TDI e' lateral: 12.3
TDI e' medial: 9.57
VTI: 46.7 cm
Valve area index: 0.79
Valve area: 1.86 cm2
WEIGHTICAEL: 3688 [oz_av]

## 2016-04-09 MED ORDER — LISINOPRIL 40 MG PO TABS: 40.0000 mg | ORAL_TABLET | Freq: Every day | ORAL | Status: DC

## 2016-04-09 NOTE — Addendum Note (Signed)
Addended by: Britt Bottom on: 04/09/2016 02:34 PM   Modules accepted: Orders

## 2016-04-09 NOTE — Patient Instructions (Addendum)
Medication Instructions:  Your physician recommends that you continue on your current medications as directed. Please refer to the Current Medication list given to you today.   Labwork: CMET today  Testing/Procedures: Your physician has requested that you have an echocardiogram. Echocardiography is a painless test that uses sound waves to create images of your heart. It provides your doctor with information about the size and shape of your heart and how well your heart's chambers and valves are working. This procedure takes approximately one hour. There are no restrictions for this procedure.    Follow-Up: Your physician recommends that you schedule a follow-up appointment in: six months with Dr. Fletcher Anon.    Any Other Special Instructions Will Be Listed Below (If Applicable).     If you need a refill on your cardiac medications before your next appointment, please call your pharmacy.  Echocardiogram An echocardiogram, or echocardiography, uses sound waves (ultrasound) to produce an image of your heart. The echocardiogram is simple, painless, obtained within a short period of time, and offers valuable information to your health care provider. The images from an echocardiogram can provide information such as:  Evidence of coronary artery disease (CAD).  Heart size.  Heart muscle function.  Heart valve function.  Aneurysm detection.  Evidence of a past heart attack.  Fluid buildup around the heart.  Heart muscle thickening.  Assess heart valve function. LET [facility] CARE PROVIDER KNOW ABOUT:  Any allergies you have.  All medicines you are taking, including vitamins, herbs, eye drops, creams, and over-the-counter medicines.  Previous problems you or members of your family have had with the use of anesthetics.  Any blood disorders you have.  Previous surgeries you have had.  Medical conditions you have.  Possibility of pregnancy, if this applies. BEFORE THE  PROCEDURE  No special preparation is needed. Eat and drink normally.  PROCEDURE   In order to produce an image of your heart, gel will be applied to your chest and a wand-like tool (transducer) will be moved over your chest. The gel will help transmit the sound waves from the transducer. The sound waves will harmlessly bounce off your heart to allow the heart images to be captured in real-time motion. These images will then be recorded.  You may need an IV to receive a medicine that improves the quality of the pictures. AFTER THE PROCEDURE You may return to your normal schedule including diet, activities, and medicines, unless your health care provider tells you otherwise.   This information is not intended to replace advice given to you by your health care provider. Make sure you discuss any questions you have with your health care provider.   Document Released: 10/03/2000 Document Revised: 10/27/2014 Document Reviewed: 06/13/2013 Elsevier Interactive Patient Education [employer].

## 2016-04-09 NOTE — Progress Notes (Signed)
Cardiology Office Note Date:  04/09/2016  Patient ID:  Chase [name] 1956-04-28, MRN [id number] PCP:  Lelon Huh, MD  Cardiologist:  Dr. Fletcher Anon, MD    Chief Complaint: Medication refill  History of Present Illness: Chase [name] is a 60 y.o. male with history of HTN, ongoing tobacco abuse at 2 packs daily for 40+ years, obesity, HLD, erectile dysfunction, alcohol abuse driking 6-8 beers daily, and psoriasis who presents for routine follow up of his HTN and for medication refills. He was initially and last seen by Dr. Fletcher Anon, MD on 05/08/2015 for evaluation of chest pain. At that time it was noted the patient had not seen a PCP in more than 25 years unitl he established with Dr. Caryn Section, MD on 03/28/2015. At that PCP visit he was noted to have elevated blood pressure and was started on lisinopril, which was further titrated by Dr. Fletcher Anon to 40 mg daily. EKG at PCP office showed nonspecific T wave changes. Labs at that time showed HLD and hypogonadism. At his office visit with Dr. Fletcher Anon on 05/08/15 he noted rare episodes of sharp chest pain described as twinges occuring both at rest and with exertion. He denied any family history of premature CAD. He underwent ETT on 06/14/2015 that showed no st segment deviation or T-wave inversion during stress. This was a normal ETT with noted mildly reduced exercise capacity and hypertensive response to exercise. He called on 07/02/2015 with BP readings in the 123XX123 systolic mostly, with a peak isolated reading of Q000111Q systolic. He was advised to follow up with PCP. He saw PCP on 07/16/2015 with a BP of 118/64 at triage. Labs at that time showed stable renal function and TC from 226 in 03/2015 to 196 in 06/2015 along with an LDL 156-->122. He was continued on low-fat diet.   We called the patient on 03/10/2016 to have patient come in for refills.   He comes in today doing well. He has been taking lisinopril 40 mg daily without issues. He has not yet run out of lisinopril,  though thinks he has 1-3 days left on his current Rx. BP at home has been in the 123XX123 systolic, though he is not certain of the accuracy of his BP cuff. He has not had any chest pain, SOB, nausea, vomiting, diaphoresis, or syncope. He continues to be very active without issues. For example, he just recently mowed several "big sized" lawns with a push mower 3-4 days ago without any limiting symptoms. He does continue to smoke tobacco at 0.5 ppd and is not interested in quitting at this time. He also continues to drink 6-8 beers daily and is also not interested in quitting his alcohol usage at this time. His weight has been stable. He eats a lot of veggies and occasionally eats fried chicken, which he reports he will not stop.   Lastly, after examining him and noting LE swelling he reports this occurs with his tight socks and is resolved 30 minutes after taking his socks off.    Past Medical History  Diagnosis Date  . Chest pain     a. ETT 06/14/2015: no st segment or T-waves changes during stress, mildly reduced exercise capacity, hypertensive response to exercise  . Erectile dysfunction   . Chicken pox   . Mumps   . Measles   . Wears dentures     full upper  . Essential hypertension     CONTROLLED ON MEDS  . Arthritis     "maybe" -  hands  . HLD (hyperlipidemia)   . Polysubstance abuse     a. ongoing tobacco and alcohol abuse    Past Surgical History  Procedure Laterality Date  . Appendectomy  1972  . Colonoscopy with propofol N/A 08/27/2015    Procedure: COLONOSCOPY WITH PROPOFOL;  Surgeon: Lucilla Lame, MD;  Location: [facility];  Service: Endoscopy;  Laterality: N/A;  . Polypectomy  08/27/2015    Procedure: POLYPECTOMY;  Surgeon: Lucilla Lame, MD;  Location: [place];  Service: Endoscopy;;    Current Outpatient Prescriptions  Medication Sig Dispense Refill  . aspirin 81 MG tablet Take 81 mg by mouth daily. AM    . CINNAMON PO Take by mouth. AM    . lisinopril  (PRINIVIL,ZESTRIL) 40 MG tablet Take 1 tablet (40 mg total) by mouth daily. 30 tablet 5  . Multiple Vitamin (MULTIVITAMIN) capsule Take 1 capsule by mouth daily. AM    . Omega-3 Fatty Acids (FISH OIL) 1000 MG CAPS Take 1 capsule by mouth.     No current facility-administered medications for this visit.    Allergies:   Review of patient's allergies indicates no known allergies.   Social History:  The patient  reports that he has been smoking Cigarettes.  He started smoking about 46 years ago. He has a 90 pack-year smoking history. He has never used smokeless tobacco. He reports that he drinks about 18.0 oz of alcohol per week. He reports that he does not use illicit drugs.   Family History:  The patient's family history includes Heart disease in his mother; Pancreatic cancer in his father.  ROS:   Review of Systems  Constitutional: Negative for fever, chills, weight loss, malaise/fatigue and diaphoresis.  HENT: Negative for congestion.   Eyes: Negative for discharge and redness.  Respiratory: Negative for cough, hemoptysis, sputum production, shortness of breath and wheezing.   Cardiovascular: Negative for chest pain, palpitations, orthopnea, claudication, leg swelling and PND.  Gastrointestinal: Negative for heartburn, nausea and vomiting.  Musculoskeletal: Negative for myalgias and falls.  Skin: Negative for rash.  Neurological: Negative for dizziness, tingling, tremors, sensory change, speech change, focal weakness, loss of consciousness, weakness and headaches.  Endo/Heme/Allergies: Does not bruise/bleed easily.  Psychiatric/Behavioral: Positive for substance abuse. The patient is not nervous/anxious.   All other systems reviewed and are negative.    PHYSICAL EXAM:  VS:  BP 120/70 mmHg  Pulse 70  Ht 6\' 1"  (1.854 m)  Wt 230 lb 8 oz (104.554 kg)  BMI 30.42 kg/m2 BMI: Body mass index is 30.42 kg/(m^2). Well nourished, well developed, in no acute distress HEENT: normocephalic,  atraumatic Neck: no JVD, carotid bruits or masses Cardiac: normal S1, S2; RRR; no murmurs, rubs, or gallops Lungs: clear to auscultation bilaterally, no wheezing, rhonchi or rales Abd: soft, nontender, no hepatomegaly, + BS MS: no deformity or atrophy Ext: 1+ pitting edema along the bilateral LE to the mid shin Skin: warm and dry, no rash Neuro:  moves all extremities spontaneously, no focal abnormalities noted, follows commands Psych: euthymic mood, full affect   EKG:  Was ordered and interpreted by me today. Shows NSR, 70 bpm, no acute st/t changes  Recent Labs: 07/16/2015: BUN 12; Creatinine, Ser 0.93; Potassium 4.7; Sodium 142  07/16/2015: Chol/HDL Ratio 5.2*; Cholesterol, Total 196; HDL 38*; LDL Calculated 122*; Triglycerides 181*   CrCl cannot be calculated (Patient has no serum creatinine result on file.).   Wt Readings from Last 3 Encounters:  04/09/16 230 lb 8 oz (104.554  kg)  09/10/15 232 lb (105.235 kg)  08/27/15 224 lb (101.606 kg)     Other studies reviewed: Additional studies/records reviewed today include: summarized above  ASSESSMENT AND PLAN:  1. HTN: Well controlled. Reading at PCP office in [name] [id number] was [id number] systolic. Today he has a BP of 120/70. He feels good. Regarding his BP cuff and his uncertainty of accurateness I advised him to bring in his BP cuff for comparison to ours. He will do so. For now, continues lisinopril 40 mg daily unless we see his at home readings are truly accurate. Advised patient on heart healthy lifestyle and diet. Check cmet.   2. Ongoing tobacco abuse: Cessation is advised. He reports not being ready to quit. Advised him to contact us whenever he is ready.   3. Ongoing alcohol abuse: Cessation is advised. He is not ready to taper usage and quit.   4. LE swelling: Check echo to evaluate LVSF, wall motion, and right-sided pressure. Advised patient to wear compression stockings and elevate his legs when sitting. He reports he cannot  elevate his legs when sitting 2/2 low back pain.    Disposition: F/u with Dr. Fletcher Anon, MD in 6 months  Current medicines are reviewed at length with the patient today.  The patient did not have any concerns regarding medicines.  Melvern Banker PA-C 04/09/2016 1:59 PM     [address] [place], [place] [address] [phone]

## 2016-04-10 LAB — COMPREHENSIVE METABOLIC PANEL
ALT: 33 IU/L (ref 0–44)
AST: 20 IU/L (ref 0–40)
Albumin/Globulin Ratio: 2.1 (ref 1.2–2.2)
Albumin: 4.7 g/dL (ref 3.5–5.5)
Alkaline Phosphatase: 51 IU/L (ref 39–117)
BUN/Creatinine Ratio: 13 (ref 9–20)
BUN: 12 mg/dL (ref 6–24)
Bilirubin Total: 0.7 mg/dL (ref 0.0–1.2)
CO2: 23 mmol/L (ref 18–29)
Calcium: 9.6 mg/dL (ref 8.7–10.2)
Chloride: 101 mmol/L (ref 96–106)
Creatinine, Ser: 0.91 mg/dL (ref 0.76–1.27)
GFR calc Af Amer: 106 mL/min/{1.73_m2} (ref >59)
GFR calc non Af Amer: 92 mL/min/{1.73_m2} (ref >59)
Globulin, Total: 2.2 g/dL (ref 1.5–4.5)
Glucose: 93 mg/dL (ref 65–99)
Potassium: 5.2 mmol/L (ref 3.5–5.2)
Sodium: 142 mmol/L (ref 134–144)
Total Protein: 6.9 g/dL (ref 6.0–8.5)

## 2016-04-16 ENCOUNTER — Telehealth: Payer: Self-pay | Admitting: Cardiovascular Disease

## 2016-04-16 NOTE — Telephone Encounter (Signed)
Pt calling stating he saw Chase [name] 04/09/16  He was told that we asked him to wear compression socks. Pt states he's using the restroom a lot.  Urinating a lot more that normal.  Just wants to know if that is normal Not drinking more water or anything he states. Please advise

## 2016-04-16 NOTE — Telephone Encounter (Signed)
S/w pt who reports slight increased urine output since wearing compression stockings. Stockings were encouraged by [name] d/t LE swelling.which has improved. Educated pt on reason for compression stockings and increased urine output is to be expected. Pt states he "thought that was a good thing" but wanted confirmation. Advised pt to continue to monitor and call back w/any other questions/concerns. Pt verbalized understanding and is appreciative of the call.

## 2016-11-17 ENCOUNTER — Other Ambulatory Visit: Payer: Self-pay

## 2016-11-17 MED ORDER — LISINOPRIL 40 MG PO TABS: 40.0000 mg | ORAL_TABLET | Freq: Every day | ORAL | 3 refills | Status: DC

## 2016-12-04 ENCOUNTER — Encounter: Payer: Self-pay | Admitting: Cardiovascular Disease

## 2016-12-04 ENCOUNTER — Ambulatory Visit (INDEPENDENT_AMBULATORY_CARE_PROVIDER_SITE_OTHER): Payer: Managed Care, Other (non HMO) | Admitting: Cardiovascular Disease

## 2016-12-04 VITALS — BP 124/68 | HR 91 | Ht 73.0 in | Wt 234.2 lb

## 2016-12-04 DIAGNOSIS — I10 Essential (primary) hypertension: Secondary | ICD-10-CM | POA: Diagnosis not present

## 2016-12-04 DIAGNOSIS — Z720 Tobacco use: Secondary | ICD-10-CM

## 2016-12-04 DIAGNOSIS — E785 Hyperlipidemia, unspecified: Secondary | ICD-10-CM | POA: Diagnosis not present

## 2016-12-04 MED ORDER — LISINOPRIL 40 MG PO TABS: 40.0000 mg | ORAL_TABLET | Freq: Every day | ORAL | 11 refills | Status: DC

## 2016-12-04 NOTE — Progress Notes (Signed)
Cardiology Office Note   Date:  12/04/2016   ID:  Chase [name], DOB Jun 13, 1956, MRN [id number]  PCP:  Lelon Huh, MD  Cardiologist:   [name], MD   Chief Complaint  Patient presents with  . other    6 month follow up. Patient states he is "doing well". Meds reviewed verbally with patient.       History of Present Illness: Chase [name] is a 61 y.o. male who presents for A follow-up visit regarding essential hypertension.  He has known history of hypertension, tobacco use, psoriasis, obesity, hyperlipidemia and erectile dysfunction. He smokes 2 packs per day for 40 years. He also drinks 6-8 beers a day.  He had a treadmill stress test in August 2016 for atypical chest pain. The stress test was normal with hypertensive response to exercise. Echocardiogram in June 2017 showed normal LV systolic function with trivial aortic stenosis. He has been doing reasonably well with no chest pain or shortness of breath. Leg edema improved with support stockings. He is trying to exercise at the [facility]. He reports improved diet. He also cut down on smoking to one pack per day.  Past Medical History:  Diagnosis Date  . Arthritis    "maybe" - hands  . Chest pain    a. ETT 06/14/2015: no st segment or T-waves changes during stress, mildly reduced exercise capacity, hypertensive response to exercise  . Chicken pox   . Erectile dysfunction   . Essential hypertension    CONTROLLED ON MEDS  . HLD (hyperlipidemia)   . Measles   . Mumps   . Polysubstance abuse    a. ongoing tobacco and alcohol abuse  . Wears dentures    full upper    Past Surgical History:  Procedure Laterality Date  . APPENDECTOMY  1972  . COLONOSCOPY WITH PROPOFOL N/A 08/27/2015   Procedure: COLONOSCOPY WITH PROPOFOL;  Surgeon: Lucilla Lame, MD;  Location: [facility];  Service: Endoscopy;  Laterality: N/A;  . POLYPECTOMY  08/27/2015   Procedure: POLYPECTOMY;  Surgeon: Lucilla Lame, MD;  Location: [place];  Service: Endoscopy;;     Current Outpatient Prescriptions  Medication Sig Dispense Refill  . aspirin 81 MG tablet Take 81 mg by mouth daily. AM    . CINNAMON PO Take by mouth. AM    . lisinopril (PRINIVIL,ZESTRIL) 40 MG tablet Take 1 tablet (40 mg total) by mouth daily. 30 tablet 3  . Multiple Vitamin (MULTIVITAMIN) capsule Take 1 capsule by mouth daily. AM    . Omega-3 Fatty Acids (FISH OIL) 1000 MG CAPS Take 1 capsule by mouth.     No current facility-administered medications for this visit.     Allergies:   Patient has no known allergies.    Social History:  The patient  reports that he has been smoking Cigarettes.  He started smoking about 46 years ago. He has a 90.00 pack-year smoking history. He has never used smokeless tobacco. He reports that he drinks about 18.0 oz of alcohol per week . He reports that he does not use drugs.   Family History:  The patient's family history includes Heart disease in his mother; Pancreatic cancer in his father.    ROS:  Please see the history of present illness.   Otherwise, review of systems are positive for none.   All other systems are reviewed and negative.    PHYSICAL EXAM: VS:  BP 124/68 (BP Location: Left Arm, Patient Position: Sitting, Cuff Size: Normal)  Pulse 91   Ht 6\' 1"  (1.854 m)   Wt 234 lb 4 oz (106.3 kg)   BMI 30.91 kg/m  , BMI Body mass index is 30.91 kg/m. GEN: Well nourished, well developed, in no acute distress  HEENT: normal  Neck: no JVD, carotid bruits, or masses Cardiac: RRR; no  rubs, or gallops,no edema . One out of 6 systolic ejection murmur in the aortic area Respiratory:  clear to auscultation bilaterally, normal work of breathing GI: soft, nontender, nondistended, + BS MS: no deformity or atrophy  Skin: warm and dry, no rash Neuro:  Strength and sensation are intact Psych: euthymic mood, full affect   EKG:  EKG is ordered today. The ekg ordered today demonstrates normal sinus rhythm with no  significant ST or T wave changes.   Recent Labs: 04/09/2016: ALT 33; BUN 12; Creatinine, Ser 0.91; Potassium 5.2; Sodium 142    Lipid Panel    Component Value Date/Time   CHOL 196 07/16/2015 1700   TRIG 181 (H) 07/16/2015 1700   HDL 38 (L) 07/16/2015 1700   CHOLHDL 5.2 (H) 07/16/2015 1700   LDLCALC 122 (H) 07/16/2015 1700      Wt Readings from Last 3 Encounters:  12/04/16 234 lb 4 oz (106.3 kg)  04/09/16 230 lb 8 oz (104.6 kg)  09/10/15 232 lb (105.2 kg)       No flowsheet data found.    ASSESSMENT AND PLAN:  1.  Essential hypertension: Blood pressure is controlled on lisinopril. He also improved his lifestyle.  2. Hyperlipidemia: I requested fasting lipid and liver profile. Previous LDL was 122 which was improved from before.  3. Tobacco use: He cut down to one pack per day but has not been able to quit. I discussed with him the importance of smoking cessation.  4. Chronic venous insufficiency: Symptoms improved with support stockings.    Disposition:   FU with me in 1 year  Signed,  [name], MD  12/04/2016 3:02 PM    [facility]

## 2016-12-04 NOTE — Patient Instructions (Signed)
Medication Instructions:  Your physician recommends that you continue on your current medications as directed. Please refer to the Current Medication list given to you today.   Labwork: Fasting lipid and liver profile. Nothing to eat or drink after midnight the evening before your labs  Testing/Procedures: none  Follow-Up: Your physician wants you to follow-up in: one year with Dr. Fletcher Anon.  You will receive a reminder letter in the mail two months in advance. If you don't receive a letter, please call our office to schedule the follow-up appointment.   Any Other Special Instructions Will Be Listed Below (If Applicable).     If you need a refill on your cardiac medications before your next appointment, please call your pharmacy.

## 2016-12-09 ENCOUNTER — Other Ambulatory Visit
Admission: RE | Admit: 2016-12-09 | Discharge: 2016-12-09 | Disposition: A | Discharge status: Home / Self Care | Payer: Managed Care, Other (non HMO) | Source: Ambulatory Visit | Attending: Cardiovascular Disease | Admitting: Cardiovascular Disease

## 2016-12-09 DIAGNOSIS — E785 Hyperlipidemia, unspecified: Secondary | ICD-10-CM | POA: Diagnosis present

## 2016-12-09 LAB — HEPATIC FUNCTION PANEL
ALBUMIN: 4.2 g/dL (ref 3.5–5.0)
ALK PHOS: 42 U/L (ref 38–126)
ALT: 40 U/L (ref 17–63)
AST: 25 U/L (ref 15–41)
BILIRUBIN TOTAL: 0.7 mg/dL (ref 0.3–1.2)
Bilirubin, Direct: 0.1 mg/dL (ref 0.1–0.5)
Indirect Bilirubin: 0.6 mg/dL (ref 0.3–0.9)
Total Protein: 6.8 g/dL (ref 6.5–8.1)

## 2016-12-09 LAB — LIPID PANEL
CHOLESTEROL: 232 mg/dL — AB (ref 0–200)
HDL: 43 mg/dL (ref >40)
LDL Cholesterol: 150 mg/dL — ABNORMAL HIGH (ref 0–99)
Total CHOL/HDL Ratio: 5.4 RATIO
Triglycerides: 193 mg/dL — ABNORMAL HIGH (ref <150)
VLDL: 39 mg/dL (ref 0–40)

## 2016-12-11 ENCOUNTER — Other Ambulatory Visit: Payer: Self-pay

## 2016-12-11 DIAGNOSIS — E785 Hyperlipidemia, unspecified: Secondary | ICD-10-CM

## 2016-12-11 MED ORDER — ATORVASTATIN CALCIUM 20 MG PO TABS: 20.0000 mg | ORAL_TABLET | Freq: Every day | ORAL | 3 refills | Status: DC

## 2017-01-21 ENCOUNTER — Other Ambulatory Visit
Admission: RE | Admit: 2017-01-21 | Discharge: 2017-01-21 | Disposition: A | Discharge status: Home / Self Care | Payer: Managed Care, Other (non HMO) | Source: Ambulatory Visit | Attending: Cardiovascular Disease | Admitting: Cardiovascular Disease

## 2017-01-21 DIAGNOSIS — E785 Hyperlipidemia, unspecified: Secondary | ICD-10-CM | POA: Diagnosis present

## 2017-01-21 LAB — HEPATIC FUNCTION PANEL
ALK PHOS: 46 U/L (ref 38–126)
ALT: 47 U/L (ref 17–63)
AST: 30 U/L (ref 15–41)
Albumin: 4.3 g/dL (ref 3.5–5.0)
Bilirubin, Direct: <0.1 mg/dL — ABNORMAL LOW (ref 0.1–0.5)
TOTAL PROTEIN: 6.9 g/dL (ref 6.5–8.1)
Total Bilirubin: 0.8 mg/dL (ref 0.3–1.2)

## 2017-01-21 LAB — LIPID PANEL
CHOL/HDL RATIO: 3.7 ratio
Cholesterol: 154 mg/dL (ref 0–200)
HDL: 42 mg/dL (ref >40)
LDL CALC: 66 mg/dL (ref 0–99)
Triglycerides: 229 mg/dL — ABNORMAL HIGH (ref <150)
VLDL: 46 mg/dL — ABNORMAL HIGH (ref 0–40)

## 2017-04-10 ENCOUNTER — Other Ambulatory Visit: Payer: Self-pay | Admitting: Cardiovascular Disease

## 2017-08-08 ENCOUNTER — Other Ambulatory Visit: Payer: Self-pay | Admitting: Cardiovascular Disease

## 2017-12-05 ENCOUNTER — Other Ambulatory Visit: Payer: Self-pay | Admitting: Cardiovascular Disease

## 2017-12-07 ENCOUNTER — Other Ambulatory Visit: Payer: Self-pay | Admitting: *Deleted

## 2017-12-07 MED ORDER — ATORVASTATIN CALCIUM 20 MG PO TABS: 20.0000 mg | ORAL_TABLET | Freq: Every day | ORAL | 0 refills | Status: DC

## 2017-12-29 ENCOUNTER — Telehealth: Payer: Self-pay | Admitting: Cardiovascular Disease

## 2017-12-29 ENCOUNTER — Other Ambulatory Visit: Payer: Self-pay | Admitting: Cardiovascular Disease

## 2017-12-29 NOTE — Telephone Encounter (Signed)
Lmov for patient to call back and schedule appointment ° ° °

## 2017-12-29 NOTE — Telephone Encounter (Signed)
-----   Message from Janan Ridge, [place] sent at 12/29/2017 10:23 AM EDT ----- Regarding: Patient needs an appointment Can you please try to schedule an appointment with Dr. Fletcher Anon. If patient does not want an appointment patient will need to refer to PCP for refills. Thank you!

## 2017-12-30 ENCOUNTER — Other Ambulatory Visit: Payer: Self-pay

## 2017-12-30 NOTE — Telephone Encounter (Signed)
Pt scheduled for 02/09/18

## 2017-12-30 NOTE — Telephone Encounter (Signed)
*  STAT* If patient is at the pharmacy, call can be transferred to refill team.   1. Which medications need to be refilled? (please list name of each medication and dose if known) Lisinopril 40mg   2. Which pharmacy/location (including street and city if local pharmacy) is medication to be sent to?[facility]  3. Do they need a 30 day or 90 day supply? [place]

## 2017-12-30 NOTE — Telephone Encounter (Signed)
Refill sent.  lisinopril (PRINIVIL,ZESTRIL) 40 MG tablet 30 tablet 0 12/30/2017    Sig: TAKE 1 TABLET BY MOUTH EVERY DAY   Sent to pharmacy as: lisinopril (PRINIVIL,ZESTRIL) 40 MG tablet   E-Prescribing Status: Receipt confirmed by pharmacy (12/30/2017 10:24 AM EDT)   Pharmacy   [id number] - [place], [address] AT [address]

## 2018-01-28 ENCOUNTER — Other Ambulatory Visit: Payer: Self-pay | Admitting: Cardiovascular Disease

## 2018-02-04 ENCOUNTER — Encounter: Payer: Self-pay | Admitting: Family Medicine

## 2018-02-04 ENCOUNTER — Ambulatory Visit: Payer: 59 | Admitting: Family Medicine

## 2018-02-04 VITALS — BP 142/88 | HR 88 | Temp 98.9°F | Resp 18 | Wt 226.0 lb

## 2018-02-04 DIAGNOSIS — J069 Acute upper respiratory infection, unspecified: Secondary | ICD-10-CM | POA: Diagnosis not present

## 2018-02-04 MED ORDER — AZITHROMYCIN 250 MG PO TABS: ORAL_TABLET | ORAL | 0 refills | Status: DC

## 2018-02-04 NOTE — Progress Notes (Signed)
Patient: Chase [name] Male    DOB: 29-Nov-1955   62 y.o.   MRN: [id number] Visit Date: 02/04/2018  Today's Provider: Lelon Huh, MD   Chief Complaint  Patient presents with  . Cough    x 4 days   Subjective:    Cough  This is a new problem. Episode onset: 4 days ago. The problem has been gradually improving. The cough is productive of sputum. Associated symptoms include chills, headaches, nasal congestion, postnasal drip and shortness of breath. Pertinent negatives include no chest pain, ear congestion, ear pain, fever, hemoptysis, myalgias, sore throat, sweats or wheezing. Treatments tried: Mucinex and Tylenol. The treatment provided mild relief.  He states he does feel better today, but symptoms not resolved.     No Known Allergies   Current Outpatient Medications:  .  aspirin 81 MG tablet, Take 81 mg by mouth daily. AM, Disp: , Rfl:  .  atorvastatin (LIPITOR) 20 MG tablet, TAKE 1 TABLET BY MOUTH ONCE DAILY, Disp: 30 tablet, Rfl: 0 .  CINNAMON PO, Take by mouth. AM, Disp: , Rfl:  .  lisinopril (PRINIVIL,ZESTRIL) 40 MG tablet, TAKE 1 TABLET BY MOUTH EVERY DAY, Disp: 30 tablet, Rfl: 0 .  Multiple Vitamin (MULTIVITAMIN) capsule, Take 1 capsule by mouth daily. AM, Disp: , Rfl:  .  Omega-3 Fatty Acids (FISH OIL) 1000 MG CAPS, Take 1 capsule by mouth., Disp: , Rfl:   Review of Systems  Constitutional: Positive for chills, diaphoresis and fatigue. Negative for fever.  HENT: Positive for congestion (sinus ), postnasal drip and sinus pressure. Negative for ear pain and sore throat.   Respiratory: Positive for cough and shortness of breath. Negative for hemoptysis and wheezing.   Cardiovascular: Negative for chest pain.  Musculoskeletal: Negative for myalgias.  Neurological: Positive for headaches.    Social History   Tobacco Use  . Smoking status: Current Every Day Smoker    Packs/day: 2.00    Years: 45.00    Pack years: 90.00    Types: Cigarettes    Start date:  03/27/1970  . Smokeless tobacco: Never Used  Substance Use Topics  . Alcohol use: Yes    Alcohol/week: 18.0 oz    Types: 30 Cans of beer per week    Comment: drinks 4 beers daily   Objective:   BP (!) 142/88 (BP Location: Left Arm, Patient Position: Sitting, Cuff Size: Large)   Pulse 88   Temp 98.9 F (37.2 C) (Oral)   Resp 18   Wt 226 lb (102.5 kg)   SpO2 97% Comment: room air  BMI 29.82 kg/m  Vitals:   02/04/18 1142  BP: (!) 142/88  Pulse: 88  Resp: 18  Temp: 98.9 F (37.2 C)  TempSrc: Oral  SpO2: 97%  Weight: 226 lb (102.5 kg)     Physical Exam  General Appearance:    Alert, cooperative, no distress  HENT:   bilateral TM normal without fluid or infection, neck without nodes, throat normal without erythema or exudate, frontal sinus tender and nasal mucosa pale and congested  Eyes:    PERRL, conjunctiva/corneas clear, EOM's intact       Lungs:     Clear to auscultation bilaterally, respirations unlabored  Heart:    Regular rate and rhythm  Neurologic:   Awake, alert, oriented x 3. No apparent focal neurological           defect.           Assessment &  Plan:     1. Upper respiratory tract infection, unspecified type Early sinusitis. Normal pulmonary exam.  Counseled regarding signs and symptoms of viral and bacterial respiratory infections. Advised him that he can start prescription for antibiotic if he develops any sign of bacterial infection, or if current symptoms last longer than 10 days.   - azithromycin (ZITHROMAX) 250 MG tablet; 2 by mouth today, then 1 daily for 4 days  Dispense: 6 tablet; Refill: 0       Lelon Huh, MD  [facility]

## 2018-02-04 NOTE — Patient Instructions (Addendum)
   You can start taking prescription for antibiotic if you develop persistent pain in your sinuses, fever over 101 F, or wheezing in you chest, or if you are not improving 10 days after start of symptoms.     Upper Respiratory Infection, Adult Most upper respiratory infections (URIs) are caused by a virus. A URI affects the nose, throat, and upper air passages. The most common type of URI is often called "the common cold." Follow these instructions at home:  Take medicines only as told by your doctor.  Gargle warm saltwater or take cough drops to comfort your throat as told by your doctor.  Use a warm mist humidifier or inhale steam from a shower to increase air moisture. This may make it easier to breathe.  Drink enough fluid to keep your pee (urine) clear or pale yellow.  Eat soups and other clear broths.  Have a healthy diet.  Rest as needed.  Go back to work when your fever is gone or your doctor says it is okay. ? You may need to stay home longer to avoid giving your URI to others. ? You can also wear a face mask and wash your hands often to prevent spread of the virus.  Use your inhaler more if you have asthma.  Do not use any tobacco products, including cigarettes, chewing tobacco, or electronic cigarettes. If you need help quitting, ask your doctor. Contact a doctor if:  You are getting worse, not better.  Your symptoms are not helped by medicine.  You have chills.  You are getting more short of breath.  You have brown or red mucus.  You have yellow or brown discharge from your nose.  You have pain in your face, especially when you bend forward.  You have a fever.  You have puffy (swollen) neck glands.  You have pain while swallowing.  You have white areas in the back of your throat. Get help right away if:  You have very bad or constant: ? Headache. ? Ear pain. ? Pain in your forehead, behind your eyes, and over your cheekbones (sinus pain). ? Chest  pain.  You have long-lasting (chronic) lung disease and any of the following: ? Wheezing. ? Long-lasting cough. ? Coughing up blood. ? A change in your usual mucus.  You have a stiff neck.  You have changes in your: ? Vision. ? Hearing. ? Thinking. ? Mood. This information is not intended to replace advice given to you by your health care provider. Make sure you discuss any questions you have with your health care provider. Document Released: 03/24/2008 Document Revised: 06/08/2016 Document Reviewed: 01/11/2014 Elsevier Interactive Patient Education  2018 [employer].

## 2018-02-09 ENCOUNTER — Ambulatory Visit (INDEPENDENT_AMBULATORY_CARE_PROVIDER_SITE_OTHER): Payer: 59 | Admitting: Nurse Practitioner

## 2018-02-09 ENCOUNTER — Encounter: Payer: Self-pay | Admitting: Nurse Practitioner

## 2018-02-09 VITALS — BP 144/90 | HR 78 | Ht 73.0 in | Wt 225.5 lb

## 2018-02-09 DIAGNOSIS — Z720 Tobacco use: Secondary | ICD-10-CM | POA: Diagnosis not present

## 2018-02-09 DIAGNOSIS — I10 Essential (primary) hypertension: Secondary | ICD-10-CM

## 2018-02-09 DIAGNOSIS — I350 Nonrheumatic aortic (valve) stenosis: Secondary | ICD-10-CM

## 2018-02-09 MED ORDER — VARENICLINE TARTRATE 0.5 MG X 11 & 1 MG X 42 PO MISC: ORAL | 0 refills | Status: DC

## 2018-02-09 MED ORDER — VARENICLINE TARTRATE 1 MG PO TABS: 1.0000 mg | ORAL_TABLET | Freq: Two times a day (BID) | ORAL | 1 refills | Status: DC

## 2018-02-09 MED ORDER — AMLODIPINE BESYLATE 2.5 MG PO TABS: 2.5000 mg | ORAL_TABLET | Freq: Every day | ORAL | 11 refills | Status: DC

## 2018-02-09 NOTE — Progress Notes (Signed)
Office Visit    Patient Name: Chase [name] Date of Encounter: 02/09/2018  Primary Care Provider:  Birdie Sons, MD Primary Cardiologist:  [name], MD  Chief Complaint    62 year old male with a history of hypertension, hyperlipidemia, tobacco abuse, alcohol abuse, mild aortic stenosis, erectile dysfunction, chronic venous insufficiency, and chest pain with normal stress test in 2016, who presents for follow-up.  Past Medical History    Past Medical History:  Diagnosis Date  . Arthritis    "maybe" - hands  . Chest pain    a. ETT 06/14/2015: no st segment or T-waves changes during stress, mildly reduced exercise capacity, hypertensive response to exercise  . Chicken pox   . Chronic venous insufficiency    a. improved with support hose.  . Erectile dysfunction   . Essential hypertension    CONTROLLED ON MEDS  . History of echocardiogram    a. 03/2016 Echo: EF 55-60%, no rwma, mild AS, triv AI, mildly dil Ao.  Marland Kitchen HLD (hyperlipidemia)   . Measles   . Mumps   . Polysubstance abuse ([place])    a. ongoing tobacco and alcohol abuse  . Wears dentures    full upper   Past Surgical History:  Procedure Laterality Date  . APPENDECTOMY  1972  . COLONOSCOPY WITH PROPOFOL N/A 08/27/2015   Procedure: COLONOSCOPY WITH PROPOFOL;  Surgeon: Lucilla Lame, MD;  Location: [place];  Service: Endoscopy;  Laterality: N/A;  . POLYPECTOMY  08/27/2015   Procedure: POLYPECTOMY;  Surgeon: Lucilla Lame, MD;  Location: [facility];  Service: Endoscopy;;    Allergies  No Known Allergies  History of Present Illness    62 year old male with a history of hypertension, hyperlipidemia, tobacco abuse, alcohol abuse, mild aortic stenosis, erectile dysfunction, chronic venous insufficiency, and chest pain with normal stress test 2016.  He was last seen in clinic in February 2018.  Since then, he has done reasonably well.  He continues to work full-time in an [employer].   He says he walks a fair amount at work but and also tries to walk at least a mile on the weekends.  He denies any chest pain, dyspnea, PND, orthopnea, dizziness, syncope, edema, or early satiety.  He wears compression stockings every day and says they have done a great job in keeping his lower extremity swelling at [place].  He continues to smoke 2 packs/day and says he is interested in quitting in trying Chantix.  He also  continues to drink 3-4 beers per day.  He says he is cut back and has it under control.  Home Medications    Prior to Admission medications   Medication Sig Start Date End Date Taking? Authorizing Provider  aspirin 81 MG tablet Take 81 mg by mouth daily. AM   Yes [provider]  atorvastatin (LIPITOR) 20 MG tablet TAKE 1 TABLET BY MOUTH ONCE DAILY 01/28/18  Yes Wellington Hampshire, MD  CINNAMON PO Take by mouth. AM   Yes [provider]  lisinopril (PRINIVIL,ZESTRIL) 40 MG tablet TAKE 1 TABLET BY MOUTH EVERY DAY 01/28/18  Yes Wellington Hampshire, MD  Multiple Vitamin (MULTIVITAMIN) capsule Take 1 capsule by mouth daily. AM   Yes [provider]  Omega-3 Fatty Acids (FISH OIL) 1000 MG CAPS Take 1 capsule by mouth.   Yes [provider]    Review of Systems    He denies chest pain, palpitations, dyspnea, pnd, orthopnea, n, v, dizziness, syncope,  edema, weight gain, or early satiety.  All other systems reviewed and are otherwise negative except as noted above.  Physical Exam    VS:  BP (!) 144/90 (BP Location: Left Arm, Patient Position: Sitting, Cuff Size: Normal)   Pulse 78   Ht 6\' 1"  (1.854 m)   Wt 225 lb 8 oz (102.3 kg)   BMI 29.75 kg/m  , BMI Body mass index is 29.75 kg/m. GEN: Well nourished, well developed, in no acute distress.  HEENT: normal.  Neck: Supple, no JVD, carotid bruits, or masses. Cardiac: RRR, 2/6 systolic ejection murmur loudest at the upper sternal borders but heard throughout, no rubs, or gallops. No clubbing,  cyanosis, edema.  Radials/DP/PT 2+ and equal bilaterally.  Respiratory:  Respirations regular and unlabored, clear to auscultation bilaterally. GI: Soft, nontender, nondistended, BS + x 4. MS: no deformity or atrophy. Skin: warm and dry, no rash. Neuro:  Strength and sensation are intact. Psych: Normal affect.  Accessory Clinical Findings    ECG - regular sinus rhythm, 78, no acute ST or T changes  Assessment & Plan    1.  Essential hypertension: Blood pressure elevated today at 144/90.  He has a cuff at home and checks it periodically and says this is common.  In that setting, we will continue his lisinopril but also add amlodipine 2.5 mg daily.  He goes out to eat multiple times per week and we did discuss the importance of limiting processed foods.  2.  Tobacco abuse: Patient is currently smoking 2 packs/day.  He says today that he is interested in quitting smoking and would like a prescription for Chantix.  We discussed how to use Chantix and potential side effects.  He had already read up on most of this.  I will go ahead and prescribe Chantix and have encouraged him to circle a date on his calendar to commit to quit smoking and start Chantix up to 1 week prior to that.  3.  Alcohol abuse: Still drinking 3-4 beers per day.  Complete cessation advised.  4.  Mild aortic stenosis: 2/6 systolic ejection murmur noted.  Last echo was in 2017.  No symptoms.  Consider following up an echo next year.  5.  Chronic venous insufficiency: Stable with support hose.  6.  Hyperlipidemia: On statin therapy.  Managed by primary care.  7.  Disposition: Follow-up in 1 year.  [name], NP 02/09/2018, 2:48 PM

## 2018-02-09 NOTE — Patient Instructions (Signed)
Medication Instructions:  Your physician has recommended you make the following change in your medication:  START taking amlodipine 2.5mg  once daily START chantix - please follow starter pack instructions. You have been given the continuation pack to fill in one month with one refill.    Labwork: none  Testing/Procedures: none  Follow-Up: Your physician wants you to follow-up in: 1 year with Dr. Fletcher Anon.  You will receive a reminder letter in the mail two months in advance. If you don't receive a letter, please call our office to schedule the follow-up appointment.   Any Other Special Instructions Will Be Listed Below (If Applicable).     If you need a refill on your cardiac medications before your next appointment, please call your pharmacy.

## 2018-02-27 ENCOUNTER — Other Ambulatory Visit: Payer: Self-pay | Admitting: Cardiovascular Disease

## 2018-03-06 ENCOUNTER — Other Ambulatory Visit: Payer: Self-pay | Admitting: Cardiovascular Disease

## 2018-03-22 ENCOUNTER — Emergency Department
Admission: EM | Admit: 2018-03-22 | Discharge: 2018-03-22 | Disposition: A | Discharge status: Home / Self Care | Payer: 59 | Attending: Emergency Medicine | Admitting: Emergency Medicine

## 2018-03-22 ENCOUNTER — Other Ambulatory Visit: Payer: Self-pay

## 2018-03-22 ENCOUNTER — Emergency Department: Payer: 59

## 2018-03-22 DIAGNOSIS — Z79899 Other long term (current) drug therapy: Secondary | ICD-10-CM | POA: Diagnosis not present

## 2018-03-22 DIAGNOSIS — S3992XA Unspecified injury of lower back, initial encounter: Secondary | ICD-10-CM | POA: Diagnosis present

## 2018-03-22 DIAGNOSIS — F17210 Nicotine dependence, cigarettes, uncomplicated: Secondary | ICD-10-CM | POA: Insufficient documentation

## 2018-03-22 DIAGNOSIS — R9389 Abnormal findings on diagnostic imaging of other specified body structures: Secondary | ICD-10-CM

## 2018-03-22 DIAGNOSIS — Y929 Unspecified place or not applicable: Secondary | ICD-10-CM | POA: Insufficient documentation

## 2018-03-22 DIAGNOSIS — Y9389 Activity, other specified: Secondary | ICD-10-CM | POA: Diagnosis not present

## 2018-03-22 DIAGNOSIS — I10 Essential (primary) hypertension: Secondary | ICD-10-CM | POA: Insufficient documentation

## 2018-03-22 DIAGNOSIS — R937 Abnormal findings on diagnostic imaging of other parts of musculoskeletal system: Secondary | ICD-10-CM | POA: Diagnosis not present

## 2018-03-22 DIAGNOSIS — S39012A Strain of muscle, fascia and tendon of lower back, initial encounter: Secondary | ICD-10-CM | POA: Diagnosis not present

## 2018-03-22 DIAGNOSIS — Y999 Unspecified external cause status: Secondary | ICD-10-CM | POA: Diagnosis not present

## 2018-03-22 DIAGNOSIS — M545 Low back pain, unspecified: Secondary | ICD-10-CM

## 2018-03-22 DIAGNOSIS — X500XXA Overexertion from strenuous movement or load, initial encounter: Secondary | ICD-10-CM | POA: Insufficient documentation

## 2018-03-22 DIAGNOSIS — Z7982 Long term (current) use of aspirin: Secondary | ICD-10-CM | POA: Insufficient documentation

## 2018-03-22 MED ORDER — IBUPROFEN 800 MG PO TABS
800.0000 mg | ORAL_TABLET | Freq: Once | ORAL | Status: AC
  Administered 2018-03-22: 800 mg via ORAL
  Filled 2018-03-22: qty 1

## 2018-03-22 MED ORDER — NAPROXEN 500 MG PO TABS: 500.0000 mg | ORAL_TABLET | Freq: Two times a day (BID) | ORAL | 0 refills | Status: DC

## 2018-03-22 MED ORDER — NAPROXEN 500 MG PO TABS
500.0000 mg | ORAL_TABLET | Freq: Once | ORAL | Status: AC
  Administered 2018-03-22: 500 mg via ORAL
  Filled 2018-03-22: qty 1

## 2018-03-22 MED ORDER — CYCLOBENZAPRINE HCL 10 MG PO TABS
10.0000 mg | ORAL_TABLET | Freq: Once | ORAL | Status: AC
  Administered 2018-03-22: 10 mg via ORAL
  Filled 2018-03-22: qty 1

## 2018-03-22 MED ORDER — CYCLOBENZAPRINE HCL 10 MG PO TABS: 10.0000 mg | ORAL_TABLET | Freq: Three times a day (TID) | ORAL | 0 refills | Status: DC | PRN

## 2018-03-22 NOTE — ED Provider Notes (Signed)
[facility] Emergency Department Provider Note ____________________________________________  Time seen: Approximately 7:12 AM  I have reviewed the triage vital signs and the nursing notes.  HISTORY  Chief Complaint Back Pain   HPI Chase [name] is a 62 y.o. male who presents to the emergency department for treatment of lower back pain that started on Saturday. He was kneeling down to get something out of the floor and was unable to stand back up for about 15 minutes. Since that time, he has had pain that has not improved with Ibuprofen. No radiculopathy. Pain sometimes shoots from the back to his legs when he moves. Previous back pain, but not similar to this.    Past Medical History:  Diagnosis Date  . Arthritis    "maybe" - hands  . Chest pain    a. ETT 06/14/2015: no st segment or T-waves changes during stress, mildly reduced exercise capacity, hypertensive response to exercise  . Chicken pox   . Chronic venous insufficiency    a. improved with support hose.  . Erectile dysfunction   . Essential hypertension    CONTROLLED ON MEDS  . History of echocardiogram    a. 03/2016 Echo: EF 55-60%, no rwma, mild AS, triv AI, mildly dil Ao.  Marland Kitchen HLD (hyperlipidemia)   . Measles   . Mumps   . Polysubstance abuse ([place])    a. ongoing tobacco and alcohol abuse  . Wears dentures    full upper    Patient Active Problem List   Diagnosis Date Noted  . Obesity 04/09/2016  . Atypical chest pain 04/08/2016  . Alcohol abuse 04/08/2016  . Benign neoplasm of transverse colon   . Benign neoplasm of sigmoid colon   . Rectal polyp   . Essential hypertension 05/08/2015  . Hyperlipidemia 05/08/2015  . Erectile dysfunction 03/28/2015  . Tobacco abuse 03/28/2015    Past Surgical History:  Procedure Laterality Date  . APPENDECTOMY  1972  . COLONOSCOPY WITH PROPOFOL N/A 08/27/2015   Procedure: COLONOSCOPY WITH PROPOFOL;  Surgeon: Lucilla Lame, MD;  Location: [place];  Service: Endoscopy;  Laterality: N/A;  . POLYPECTOMY  08/27/2015   Procedure: POLYPECTOMY;  Surgeon: Lucilla Lame, MD;  Location: [place];  Service: Endoscopy;;    Prior to Admission medications   Medication Sig Start Date End Date Taking? Authorizing Provider  amLODipine (NORVASC) 2.5 MG tablet Take 1 tablet (2.5 mg total) by mouth daily. 02/09/18 05/10/18  Theora Gianotti, NP  aspirin 81 MG tablet Take 81 mg by mouth daily. AM    [provider]  atorvastatin (LIPITOR) 20 MG tablet TAKE 1 TABLET BY MOUTH ONCE DAILY 03/08/18   Wellington Hampshire, MD  CINNAMON PO Take by mouth. AM    [provider]  cyclobenzaprine (FLEXERIL) 10 MG tablet Take 1 tablet (10 mg total) by mouth 3 (three) times daily as needed for muscle spasms. 03/22/18   Jlyn Cerros, Johnette Abraham B, FNP  lisinopril (PRINIVIL,ZESTRIL) 40 MG tablet TAKE 1 TABLET BY MOUTH EVERY DAY 03/01/18   Wellington Hampshire, MD  Multiple Vitamin (MULTIVITAMIN) capsule Take 1 capsule by mouth daily. AM    [provider]  naproxen (NAPROSYN) 500 MG tablet Take 1 tablet (500 mg total) by mouth 2 (two) times daily with a meal. 03/22/18   Taquila Leys B, FNP  Omega-3 Fatty Acids (FISH OIL) 1000 MG CAPS Take 1 capsule by mouth.    [provider]  varenicline (CHANTIX CONTINUING MONTH PAK) 1  MG tablet Take 1 tablet (1 mg total) by mouth 2 (two) times daily. 03/11/18   Theora Gianotti, NP  varenicline (CHANTIX STARTING MONTH PAK) 0.5 MG X 11 & 1 MG X 42 tablet Take one 0.5 mg tablet by mouth once daily for 3 days, then increase to one 0.5 mg tablet twice daily for 4 days, then increase to one 1 mg tablet twice daily. 02/09/18   Theora Gianotti, NP    Allergies Patient has no known allergies.  Family History  Problem Relation Age of Onset  . Heart disease Mother   . Pancreatic cancer Father     Social History Social History   Tobacco Use  . Smoking status: Current Every Day  Smoker    Packs/day: 2.00    Years: 45.00    Pack years: 90.00    Types: Cigarettes    Start date: 03/27/1970  . Smokeless tobacco: Never Used  Substance Use Topics  . Alcohol use: Yes    Alcohol/week: 18.0 oz    Types: 30 Cans of beer per week    Comment: drinks 4 beers daily  . Drug use: No    Review of Systems Constitutional: Well appearing. Respiratory: Negative for dyspnea. Cardiovascular: Negative for change in skin temperature or color. Musculoskeletal:   Negative for chronic steroid use   Negative for trauma in the presence of osteoporosis  Negative for age over 40 and trauma.  Negative for constitutional symptoms, or history of cancer.  Negative for pain worse at night. Skin: Negative for rash, lesion, or wound.  Genitourinary: Negative for urinary retention. Rectal: Negative for fecal incontinence or new onset constipation/bowel habit changes. Hematological/Immunilogical: Negative for immunosuppression, IV drug use, or fever Neurological: Negative for burning, tingling, numb, electric, radiating pain.                        Negative for saddle anesthesia.                        Negative for focal neurologic deficit, progressive or disabling symptoms             Negative for saddle anesthesia. ____________________________________________   PHYSICAL EXAM:  VITAL SIGNS: ED Triage Vitals  Enc Vitals Group     BP 03/22/18 0415 (!) 158/78     Pulse Rate 03/22/18 0415 79     Resp 03/22/18 0415 20     Temp 03/22/18 0415 97.9 F (36.6 C)     Temp Source 03/22/18 0415 Oral     SpO2 03/22/18 0415 97 %     Weight 03/22/18 0416 240 lb (108.9 kg)     Height 03/22/18 0416 6\' 1"  (1.854 m)     Head Circumference --      Peak Flow --      Pain Score 03/22/18 0416 6     Pain Loc --      Pain Edu? --      Excl. in [place]? --     Constitutional: Alert and oriented. Well appearing and in no acute distress. Eyes: Conjunctivae are clear without discharge or drainage.  Head:  Atraumatic. Neck: Full, active range of motion. Respiratory: Respirations even and unlabored. Musculoskeletal: Decreased ROM of the back and extremities, Strength 4/5 of the lower extremities as tested. Neurologic: Reflexes of the lower extremities are 2+. Negative straight leg raise on the right or left side. Skin: Atraumatic.  Psychiatric: Behavior and affect are  normal.  ____________________________________________   LABS (all labs ordered are listed, but only abnormal results are displayed)  Labs Reviewed - No data to display ____________________________________________  RADIOLOGY Lumbar Spine:  IMPRESSION:  1. Age-indeterminate mild (approximately 25%) compression deformity  involving superior endplate of H54. Correlation for point tenderness  at this location is recommended.  2. Mild multilevel lumbar spine DDD, worse at L4-L5 and L5-S1.  3. Calcified atherosclerotic plaque within a potentially mildly  ectatic abdominal aorta. Further evaluation with dedicated abdominal  aortic ultrasound could be performed as clinically indicated.     IMPRESSION:  Maximal diameter of the abdominal aorta is 3.3 cm in the proximal  abdomen. Recommend followup by ultrasound in 3 years. This  recommendation follows ACR consensus guidelines: White Paper of the  ACR Incidental Findings Committee II on Vascular Findings. J Am Coll  Radiol 2013; 56:256-389  ___________________________________________   PROCEDURES  Procedure(s) performed:  Procedures ____________________________________________   INITIAL IMPRESSION / ASSESSMENT AND PLAN / ED COURSE  Chase [name] is a 62 y.o. male who presents to the emergency department for evaluation and treatment of back pain. He denies any injury, therefore the current history does not correlate with the presence of a new compression fracture. The incidental finding of ectatic abdominal aorta was further evaluated with [place] and reassuring results. He is  to follow up with Dr. Fletcher Anon as scheduled and advise him of the recent testing and findings within the aorta. He is to follow up with orthopedics for his back pain or see his PCP. While here, he was given naprosyn and flexeril with some improvement of his back pain. He will be given the same prescriptions. He was advised to return to the ER for symptoms of concern if unable to schedule an appointment.  Medications  ibuprofen (ADVIL,MOTRIN) tablet 800 mg (800 mg Oral Given 03/22/18 0422)  cyclobenzaprine (FLEXERIL) tablet 10 mg (10 mg Oral Given 03/22/18 0743)  naproxen (NAPROSYN) tablet 500 mg (500 mg Oral Given 03/22/18 0743)    ED Discharge Orders        Ordered    cyclobenzaprine (FLEXERIL) 10 MG tablet  3 times daily PRN     03/22/18 0937    naproxen (NAPROSYN) 500 MG tablet  2 times daily with meals     03/22/18 0937       Pertinent labs & imaging results that were available during my care of the patient were reviewed by me and considered in my medical decision making (see chart for details).  _________________________________________   FINAL CLINICAL IMPRESSION(S) / ED DIAGNOSES  Final diagnoses:  Acute lumbar back pain  Abnormal finding on imaging  Lumbosacral strain, initial encounter     If controlled substance prescribed during this visit, 12 month history viewed on the [place] prior to issuing an initial prescription for Schedule II or III opiod.    Victorino Dike, FNP 03/22/18 3734    Nena Polio, MD 03/22/18 [phone]

## 2018-03-22 NOTE — ED Triage Notes (Signed)
Patient reports lower back pain that radiates down front of legs, since kneeling on one knee Saturday.

## 2018-03-22 NOTE — ED Notes (Signed)
See triage note States he bent down to clean up on the floor  Developed a pain to mid to lower back which is moving into both hips  Ambulates well to treatment area has tried ibu and Icy/Hot patches w/o relief

## 2018-03-22 NOTE — Discharge Instructions (Signed)
Follow up with your PCP if not improving over the week.  Return to the ER for symptoms that change or worsen or for new concerns.

## 2018-04-04 ENCOUNTER — Other Ambulatory Visit: Payer: Self-pay | Admitting: Cardiovascular Disease

## 2018-06-03 ENCOUNTER — Other Ambulatory Visit: Payer: Self-pay | Admitting: *Deleted

## 2018-06-03 MED ORDER — AMLODIPINE BESYLATE 2.5 MG PO TABS: 2.5000 mg | ORAL_TABLET | Freq: Every day | ORAL | 11 refills | Status: DC

## 2018-06-22 ENCOUNTER — Other Ambulatory Visit: Payer: Self-pay

## 2018-06-22 ENCOUNTER — Telehealth: Payer: Self-pay | Admitting: Cardiovascular Disease

## 2018-06-22 NOTE — Telephone Encounter (Signed)
Please advise if ok to refill Chantix. 

## 2018-06-22 NOTE — Telephone Encounter (Signed)
Prescribed by NP at last appointment. Routing to [place] to advise if ok to refill.

## 2018-06-22 NOTE — Telephone Encounter (Signed)
°*  STAT* If patient is at the pharmacy, call can be transferred to refill team.   1. Which medications need to be refilled? (please list name of each medication and dose if known)  Chantix  2. Which pharmacy/location (including street and city if local pharmacy) is medication to be sent to? [facility] [address] [place]   3. Do they need a 30 day or 90 day supply? 90   Patient still trying to quit smoking

## 2018-06-22 NOTE — Telephone Encounter (Signed)
Looks like he was last seen in April and Chantix was Rx @ that time.  If he quit at that point, he should be off of it by now.  Please inquire as to when he quit (if he has) or is he trying again.  If he's trying again, then he will need the starter and continuation packs.  If he's continuing from prior Rx, then he just needs 0.5 bid and shouldn't need for more than a few wks, assuming that he would be finishing soon.  In either situation, he should not be smoking at this point.

## 2018-06-23 MED ORDER — VARENICLINE TARTRATE 0.5 MG PO TABS: 0.5000 mg | ORAL_TABLET | Freq: Two times a day (BID) | ORAL | 1 refills | Status: DC

## 2018-06-23 NOTE — Telephone Encounter (Signed)
Patient states he ran out of the Chantix. He is doing well on the Rx and has cut down. He recently had some stressful changes at work which has prevented him from being about to stop completely. He verbalized understanding of the importance of stopping smoking. I sent in the Chantix 0.5 mg tablet BID as ordered by Gerald Stabs to verified pharmacy. Patient was appreciative.

## 2018-08-05 ENCOUNTER — Other Ambulatory Visit: Payer: Self-pay | Admitting: Cardiovascular Disease

## 2018-08-26 ENCOUNTER — Other Ambulatory Visit: Payer: Self-pay | Admitting: Cardiovascular Disease

## 2018-09-10 ENCOUNTER — Other Ambulatory Visit: Payer: Self-pay | Admitting: *Deleted

## 2018-09-10 MED ORDER — AMLODIPINE BESYLATE 2.5 MG PO TABS: 2.5000 mg | ORAL_TABLET | Freq: Every day | ORAL | 6 refills | Status: DC

## 2018-12-15 ENCOUNTER — Other Ambulatory Visit: Payer: Self-pay | Admitting: Cardiovascular Disease

## 2019-03-09 ENCOUNTER — Other Ambulatory Visit: Payer: Self-pay | Admitting: Cardiovascular Disease

## 2019-03-16 ENCOUNTER — Other Ambulatory Visit: Payer: Self-pay | Admitting: *Deleted

## 2019-03-16 MED ORDER — AMLODIPINE BESYLATE 2.5 MG PO TABS: 2.5000 mg | ORAL_TABLET | Freq: Every day | ORAL | 0 refills | Status: DC

## 2019-03-20 ENCOUNTER — Other Ambulatory Visit: Payer: Self-pay | Admitting: Cardiovascular Disease

## 2019-04-01 ENCOUNTER — Telehealth: Payer: Self-pay

## 2019-04-01 NOTE — Telephone Encounter (Signed)
Virtual Visit Pre-Appointment Phone Call  "Chase [name], I am calling you today to discuss your upcoming appointment. We are currently trying to limit exposure to the virus that causes COVID-19 by seeing patients at home rather than in the office."  1. "What is the BEST phone number to call the day of the visit?" - include this in appointment notes  2. Do you have or have access to (through a family member/friend) a smartphone with video capability that we can use for your visit?" a. If yes - list this number in appt notes as cell (if different from BEST phone #) and list the appointment type as a VIDEO visit in appointment notes b. If no - list the appointment type as a PHONE visit in appointment notes  3. Confirm consent - "In the setting of the current Covid19 crisis, you are scheduled for a phone visit with your provider on 04/19/2019 at 3:30PM.  Just as we do with many in-office visits, in order for you to participate in this visit, we must obtain consent.  If you'd like, I can send this to your mychart (if signed up) or email for you to review.  Otherwise, I can obtain your verbal consent now.  All virtual visits are billed to your insurance company just like a normal visit would be.  By agreeing to a virtual visit, we'd like you to understand that the technology does not allow for your provider to perform an examination, and thus may limit your provider's ability to fully assess your condition. If your provider identifies any concerns that need to be evaluated in person, we will make arrangements to do so.  Finally, though the technology is pretty good, we cannot assure that it will always work on either your or our end, and in the setting of a video visit, we may have to convert it to a phone-only visit.  In either situation, we cannot ensure that we have a secure connection.  Are you willing to proceed?" STAFF: Did the patient verbally acknowledge consent to telehealth visit? Document YES/NO here:  YES  4. Advise patient to be prepared - "Two hours prior to your appointment, go ahead and check your blood pressure, pulse, oxygen saturation, and your weight (if you have the equipment to check those) and write them all down. When your visit starts, your provider will ask you for this information. If you have an Apple Watch or Kardia device, please plan to have heart rate information ready on the day of your appointment. Please have a pen and paper handy nearby the day of the visit as well."  5. Give patient instructions for MyChart download to smartphone OR [email or web address] as below if video visit (depending on what platform provider is using)  6. Inform patient they will receive a phone call 15 minutes prior to their appointment time (may be from unknown caller ID) so they should be prepared to answer    TELEPHONE CALL NOTE  Chase [name] has been deemed a candidate for a follow-up tele-health visit to limit community exposure during the Covid-19 pandemic. I spoke with the patient via phone to ensure availability of phone/video source, confirm preferred email & phone number, and discuss instructions and expectations.  I reminded Chase [name] to be prepared with any vital sign and/or heart rhythm information that could potentially be obtained via home monitoring, at the time of his visit. I reminded Chase [name] to expect a phone call prior to his visit.  Chase [name] L  Raelene Bott 04/01/2019 10:32 AM    FULL LENGTH CONSENT FOR TELE-HEALTH VISIT   I hereby voluntarily request, consent and authorize [facility] and its employed or contracted physicians, physician assistants, nurse practitioners or other licensed health care professionals (the Practitioner), to provide me with telemedicine health care services (the Services") as deemed necessary by the treating Practitioner. I acknowledge and consent to receive the Services by the Practitioner via telemedicine. I understand that the telemedicine  visit will involve communicating with the Practitioner through live audiovisual communication technology and the disclosure of certain medical information by electronic transmission. I acknowledge that I have been given the opportunity to request an in-person assessment or other available alternative prior to the telemedicine visit and am voluntarily participating in the telemedicine visit.  I understand that I have the right to withhold or withdraw my consent to the use of telemedicine in the course of my care at any time, without affecting my right to future care or treatment, and that the Practitioner or I may terminate the telemedicine visit at any time. I understand that I have the right to inspect all information obtained and/or recorded in the course of the telemedicine visit and may receive copies of available information for a reasonable fee.  I understand that some of the potential risks of receiving the Services via telemedicine include:   Delay or interruption in medical evaluation due to technological equipment failure or disruption;  Information transmitted may not be sufficient (e.g. poor resolution of images) to allow for appropriate medical decision making by the Practitioner; and/or   In rare instances, security protocols could fail, causing a breach of personal health information.  Furthermore, I acknowledge that it is my responsibility to provide information about my medical history, conditions and care that is complete and accurate to the best of my ability. I acknowledge that Practitioner's advice, recommendations, and/or decision may be based on factors not within their control, such as incomplete or inaccurate data provided by me or distortions of diagnostic images or specimens that may result from electronic transmissions. I understand that the practice of medicine is not an exact science and that Practitioner makes no warranties or guarantees regarding treatment outcomes. I  acknowledge that I will receive a copy of this consent concurrently upon execution via email to the email address I last provided but may also request a printed copy by calling the office of [place].    I understand that my insurance will be billed for this visit.   I have read or had this consent read to me.  I understand the contents of this consent, which adequately explains the benefits and risks of the Services being provided via telemedicine.   I have been provided ample opportunity to ask questions regarding this consent and the Services and have had my questions answered to my satisfaction.  I give my informed consent for the services to be provided through the use of telemedicine in my medical care  By participating in this telemedicine visit I agree to the above.

## 2019-04-15 ENCOUNTER — Telehealth: Payer: Self-pay

## 2019-04-15 NOTE — Telephone Encounter (Signed)
    COVID-19 Pre-Screening Questions:  . In the past 7 to 10 days have you had a cough, shortness of breath, headache, congestion, fever (100 or greater), body aches, chills, sore throat, or sudden loss of taste or sense of smell? NO . Have you been around anyone with known Covid 19? NO . Have you been around anyone who is awaiting Covid 19 test results in the past 7 to 10 days? NO . Have you been around anyone who has been exposed to Covid 19, or has mentioned symptoms of Covid 19 within the past 7 to 10 days? NO  If you have any concerns/questions about symptoms patients report during screening (either on the phone or at [facility]). Contact the provider seeing the patient or DOD for further guidance.  If neither are available contact a member of the leadership team.            

## 2019-04-19 ENCOUNTER — Other Ambulatory Visit: Payer: Self-pay

## 2019-04-19 ENCOUNTER — Ambulatory Visit: Payer: 59 | Admitting: Nurse Practitioner

## 2019-04-19 ENCOUNTER — Encounter: Payer: Self-pay | Admitting: Nurse Practitioner

## 2019-04-19 VITALS — BP 138/76 | HR 88 | Ht 73.0 in | Wt 233.0 lb

## 2019-04-19 DIAGNOSIS — I350 Nonrheumatic aortic (valve) stenosis: Secondary | ICD-10-CM | POA: Diagnosis not present

## 2019-04-19 DIAGNOSIS — Z720 Tobacco use: Secondary | ICD-10-CM | POA: Diagnosis not present

## 2019-04-19 DIAGNOSIS — I10 Essential (primary) hypertension: Secondary | ICD-10-CM

## 2019-04-19 MED ORDER — CHANTIX STARTING MONTH PAK 0.5 MG X 11 & 1 MG X 42 PO TABS: ORAL_TABLET | ORAL | 0 refills | Status: DC

## 2019-04-19 MED ORDER — AMLODIPINE BESYLATE 5 MG PO TABS: 5.0000 mg | ORAL_TABLET | Freq: Every day | ORAL | 3 refills | Status: DC

## 2019-04-19 MED ORDER — VARENICLINE TARTRATE 1 MG PO TABS: 1.0000 mg | ORAL_TABLET | Freq: Two times a day (BID) | ORAL | 2 refills | Status: DC

## 2019-04-19 NOTE — Patient Instructions (Addendum)
Medication Instructions:  Your physician has recommended you make the following change in your medication:  1- INCREASE Amlodipine to 5 mg by mouth once a day. 2- CHANTIX as directed. Begin with started pack and then continue with 3 month of 1 mg two times a day thereafter.  If you need a refill on your cardiac medications before your next appointment, please call your pharmacy.   Lab work: - None ordered.  If you have labs (blood work) drawn today and your tests are completely normal, you will receive your results only by: Chase [name] MyChart Message (if you have MyChart) OR . A paper copy in the mail If you have any lab test that is abnormal or we need to change your treatment, we will call you to review the results.  Testing/Procedures: Your physician has requested that you have an echocardiogram. Echocardiography is a painless test that uses sound waves to create images of your heart. It provides your doctor with information about the size and shape of your heart and how well your heart's chambers and valves are working. This procedure takes approximately one hour. There are no restrictions for this procedure. You may get an IV, if needed, to receive an ultrasound enhancing agent through to better visualize your heart.     Follow-Up: At [facility], you and your health needs are our priority.  As part of our continuing mission to provide you with exceptional heart care, we have created designated Provider Care Teams.  These Care Teams include your primary Cardiologist (physician) and Advanced Practice Providers (APPs -  Physician Assistants and Nurse Practitioners) who all work together to provide you with the care you need, when you need it. You will need a follow up appointment in 6 months.  Please call our office 2 months in advance to schedule this appointment.  You may see [name], MD or one of the following Advanced Practice Providers on your designated Care Team:   [name], NP [name], PA-C . [name], PA-C      Varenicline oral tablets What is this medicine? VARENICLINE (var e NI kleen) is used to help people quit smoking. It is used with a patient support program recommended by your physician. This medicine may be used for other purposes; ask your health care provider or pharmacist if you have questions. COMMON BRAND NAME(S): Chantix What should I tell my health care provider before I take this medicine? They need to know if you have any of these conditions:  heart disease  if you often drink alcohol  kidney disease  mental illness  on hemodialysis  seizures  history of stroke  suicidal thoughts, plans, or attempt; a previous suicide attempt by you or a family member  an unusual or allergic reaction to varenicline, other medicines, foods, dyes, or preservatives  pregnant or trying to get pregnant  breast-feeding How should I use this medicine? Take this medicine by mouth after eating. Take with a full glass of water. Follow the directions on the prescription label. Take your doses at regular intervals. Do not take your medicine more often than directed. There are 3 ways you can use this medicine to help you quit smoking; talk to your health care professional to decide which plan is right for you: 1) you can choose a quit date and start this medicine 1 week before the quit date, or, 2) you can start taking this medicine before you choose a quit date, and then pick a quit date  between day 8 and 35 days of treatment, or, 3) if you are not sure that you are able or willing to quit smoking right away, start taking this medicine and slowly decrease the amount you smoke as directed by your health care professional with the goal of being cigarette-free by week 12 of treatment. Stick to your plan; ask about support groups or other ways to help you remain cigarette-free. If you are motivated to quit smoking and did not succeed during  a previous attempt with this medicine for reasons other than side effects, or if you returned to smoking after this treatment, speak with your health care professional about whether another course of this medicine may be right for you. A special MedGuide will be given to you by the pharmacist with each prescription and refill. Be sure to read this information carefully each time. Talk to your pediatrician regarding the use of this medicine in children. This medicine is not approved for use in children. Overdosage: If you think you have taken too much of this medicine contact a poison control center or emergency room at once. NOTE: This medicine is only for you. Do not share this medicine with others. What if I miss a dose? If you miss a dose, take it as soon as you can. If it is almost time for your next dose, take only that dose. Do not take double or extra doses. What may interact with this medicine?  alcohol  insulin  other medicines used to help people quit smoking  theophylline  warfarin This list may not describe all possible interactions. Give your health care provider a list of all the medicines, herbs, non-prescription drugs, or dietary supplements you use. Also tell them if you smoke, drink alcohol, or use illegal drugs. Some items may interact with your medicine. What should I watch for while using this medicine? It is okay if you do not succeed at your attempt to quit and have a cigarette. You can still continue your quit attempt and keep using this medicine as directed. Just throw away your cigarettes and get back to your quit plan. Talk to your health care provider before using other treatments to quit smoking. Using this medicine with other treatments to quit smoking may increase the risk for side effects compared to using a treatment alone. You may get drowsy or dizzy. Do not drive, use machinery, or do anything that needs mental alertness until you know how this medicine  affects you. Do not stand or sit up quickly, especially if you are an older patient. This reduces the risk of dizzy or fainting spells. Decrease the number of alcoholic beverages that you drink during treatment with this medicine until you know if this medicine affects your ability to tolerate alcohol. Some people have experienced increased drunkenness (intoxication), unusual or sometimes aggressive behavior, or no memory of things that have happened (amnesia) during treatment with this medicine. Sleepwalking can happen during treatment with this medicine, and can sometimes lead to behavior that is harmful to you, other people, or property. Stop taking this medicine and tell your doctor if you start sleepwalking or have other unusual sleep-related activity. After taking this medicine, you may get up out of bed and do an activity that you do not know you are doing. The next morning, you may have no memory of this. Activities include driving a car ("sleep-driving"), making and eating food, talking on the phone, sexual activity, and sleep-walking. Serious injuries have occurred. Stop the medicine  and call your doctor right away if you find out you have done any of these activities. Do not take this medicine if you have used alcohol that evening. Do not take it if you have taken another medicine for sleep. The risk of doing these sleep-related activities is higher. Patients and their families should watch out for new or worsening depression or thoughts of suicide. Also watch out for sudden changes in feelings such as feeling anxious, agitated, panicky, irritable, hostile, aggressive, impulsive, severely restless, overly excited and hyperactive, or not being able to sleep. If this happens, call your health care professional. If you have diabetes and you quit smoking, the effects of insulin may be increased and you may need to reduce your insulin dose. Check with your doctor or health care professional about how you  should adjust your insulin dose. What side effects may I notice from receiving this medicine? Side effects that you should report to your doctor or health care professional as soon as possible:  allergic reactions like skin rash, itching or hives, swelling of the face, lips, tongue, or throat  acting aggressive, being angry or violent, or acting on dangerous impulses  breathing problems  changes in emotions or moods  chest pain or chest tightness  feeling faint or lightheaded, falls  hallucination, loss of contact with reality  mouth sores  redness, blistering, peeling or loosening of the skin, including inside the mouth  signs and symptoms of a stroke like changes in vision; confusion; trouble speaking or understanding; severe headaches; sudden numbness or weakness of the face, arm or leg; trouble walking; dizziness; loss of balance or coordination  seizures  sleepwalking  suicidal thoughts or other mood changes Side effects that usually do not require medical attention (report to your doctor or health care professional if they continue or are bothersome):  constipation  gas  headache  nausea, vomiting  strange dreams  trouble sleeping This list may not describe all possible side effects. Call your doctor for medical advice about side effects. You may report side effects to FDA at 1-800-FDA-1088. Where should I keep my medicine? Keep out of the reach of children. Store at room temperature between 15 and 30 degrees C (59 and 86 degrees F). Throw away any unused medicine after the expiration date. NOTE: This sheet is a summary. It may not cover all possible information. If you have questions about this medicine, talk to your doctor, pharmacist, or health care provider.  2020 Elsevier/Gold Standard (2018-09-24 14:27:36)   Echocardiogram An echocardiogram is a procedure that uses painless sound waves (ultrasound) to produce an image of the heart. Images from an  echocardiogram can provide important information about:  Signs of coronary artery disease (CAD).  Aneurysm detection. An aneurysm is a weak or damaged part of an artery wall that bulges out from the normal force of blood pumping through the body.  Heart size and shape. Changes in the size or shape of the heart can be associated with certain conditions, including heart failure, aneurysm, and CAD.  Heart muscle function.  Heart valve function.  Signs of a past heart attack.  Fluid buildup around the heart.  Thickening of the heart muscle.  A tumor or infectious growth around the heart valves. Tell a health care provider about:  Any allergies you have.  All medicines you are taking, including vitamins, herbs, eye drops, creams, and over-the-counter medicines.  Any blood disorders you have.  Any surgeries you have had.  Any medical conditions  you have.  Whether you are pregnant or may be pregnant. What are the risks? Generally, this is a safe procedure. However, problems may occur, including:  Allergic reaction to dye (contrast) that may be used during the procedure. What happens before the procedure? No specific preparation is needed. You may eat and drink normally. What happens during the procedure?   An IV tube may be inserted into one of your veins.  You may receive contrast through this tube. A contrast is an injection that improves the quality of the pictures from your heart.  A gel will be applied to your chest.  A wand-like tool (transducer) will be moved over your chest. The gel will help to transmit the sound waves from the transducer.  The sound waves will harmlessly bounce off of your heart to allow the heart images to be captured in real-time motion. The images will be recorded on a computer. The procedure may vary among health care providers and hospitals. What happens after the procedure?  You may return to your normal, everyday life, including diet,  activities, and medicines, unless your health care provider tells you not to do that. Summary  An echocardiogram is a procedure that uses painless sound waves (ultrasound) to produce an image of the heart.  Images from an echocardiogram can provide important information about the size and shape of your heart, heart muscle function, heart valve function, and fluid buildup around your heart.  You do not need to do anything to prepare before this procedure. You may eat and drink normally.  After the echocardiogram is completed, you may return to your normal, everyday life, unless your health care provider tells you not to do that. This information is not intended to replace advice given to you by your health care provider. Make sure you discuss any questions you have with your health care provider. Document Released: 10/03/2000 Document Revised: 01/27/2019 Document Reviewed: 11/08/2016 Elsevier Patient Education  2020 [employer].

## 2019-04-19 NOTE — Progress Notes (Signed)
Office Visit    Patient Name: Chase [name] Date of Encounter: 04/19/2019  Primary Care Provider:  Birdie Sons, MD Primary Cardiologist:  [name], MD  Chief Complaint    63 y/o ? with a h/o HTN, HL, mild AS, tob abuse, etoh abuse, ED, chronic venous insuff, and c/p w/ nl stress test in 2016, who presents for f/u of HTN.  Past Medical History    Past Medical History:  Diagnosis Date  . Arthritis    "maybe" - hands  . Chest pain    a. ETT 06/14/2015: no st segment or T-waves changes during stress, mildly reduced exercise capacity, hypertensive response to exercise  . Chicken pox   . Chronic venous insufficiency    a. improved with support hose.  . Erectile dysfunction   . Essential hypertension    CONTROLLED ON MEDS  . History of echocardiogram    a. 03/2016 Echo: EF 55-60%, no rwma, mild AS, triv AI, mildly dil Ao.  Marland Kitchen HLD (hyperlipidemia)   . Measles   . Mumps   . Polysubstance abuse ([place])    a. ongoing tobacco and alcohol abuse  . Wears dentures    full upper   Past Surgical History:  Procedure Laterality Date  . APPENDECTOMY  1972  . COLONOSCOPY WITH PROPOFOL N/A 08/27/2015   Procedure: COLONOSCOPY WITH PROPOFOL;  Surgeon: Lucilla Lame, MD;  Location: [place];  Service: Endoscopy;  Laterality: N/A;  . POLYPECTOMY  08/27/2015   Procedure: POLYPECTOMY;  Surgeon: Lucilla Lame, MD;  Location: [place];  Service: Endoscopy;;    Allergies  No Known Allergies  History of Present Illness    63 y/o ? with the above PMH including HTN, HL, tob abuse, etoh abuse, mild AS, ED, venous insuff, and chest pain.  He had a nl stress test in 2016.  He was last seen in clinic in 01/2018, at which time I added amlodipine 2.5mg  in the setting of elevated BP.  I also gave him a Rx for Chantix, which he used successfully, and had quit smoking for 4 mos, but then in the setting of stress, resumed smoking.  He is disappointed in himself and would like to get  back on chantix.  He continues to work full time.  He does not routinely exercise but has not had any issues w/ usual activities.  He denies chest pain, palpitations, dyspnea, pnd, orthopnea, n, v, dizziness, syncope, edema, weight gain, or early satiety.   Home Medications    Prior to Admission medications   Medication Sig Start Date End Date Taking? Authorizing Provider  amLODipine (NORVASC) 2.5 MG tablet Take 1 tablet (2.5 mg total) by mouth daily. 03/16/19 06/14/19 Yes Theora Gianotti, NP  aspirin 81 MG tablet Take 81 mg by mouth daily. AM   Yes [provider]  atorvastatin (LIPITOR) 20 MG tablet TAKE 1 TABLET BY MOUTH EVERY DAY 03/21/19  Yes Wellington Hampshire, MD  CINNAMON PO Take by mouth. AM   Yes [provider]  lisinopril (ZESTRIL) 40 MG tablet TAKE 1 TABLET BY MOUTH EVERY DAY 03/09/19  Yes Wellington Hampshire, MD  Multiple Vitamin (MULTIVITAMIN) capsule Take 1 capsule by mouth daily. AM   Yes [provider]  Omega-3 Fatty Acids (FISH OIL) 1000 MG CAPS Take 1 capsule by mouth.   Yes [provider]  varenicline (CHANTIX) 0.5 MG tablet Take 1 tablet (0.5 mg total) by mouth 2 (two) times daily. Patient not taking:  Reported on 04/19/2019 06/23/18   Theora Gianotti, NP    Review of Systems    He denies chest pain, palpitations, dyspnea, pnd, orthopnea, n, v, dizziness, syncope, edema, weight gain, or early satiety.  All other systems reviewed and are otherwise negative except as noted above.  Physical Exam    VS:  BP 138/76 (BP Location: Left Arm, Patient Position: Sitting, Cuff Size: Large)   Pulse 88   Ht 6\' 1"  (1.854 m)   Wt 233 lb (105.7 kg)   BMI 30.74 kg/m  , BMI Body mass index is 30.74 kg/m. GEN: Well nourished, well developed, in no acute distress. HEENT: normal. Neck: Supple, no JVD, carotid bruits, or masses. Cardiac: RRR, 2/6 SEM loudest @ the RUSB but heard throughout, no rubs, or gallops. No clubbing, cyanosis, trace  bilat ankle edema.  Radials/PT 1+ and equal bilaterally.  Respiratory:  Respirations regular and unlabored, clear to auscultation bilaterally. GI: Obese, soft, nontender, nondistended, BS + x 4. MS: no deformity or atrophy. Skin: warm and dry, no rash. Neuro:  Strength and sensation are intact. Psych: Normal affect.  Accessory Clinical Findings    ECG personally reviewed by me today - RSR, 88, no acute ST/T changes  Assessment & Plan    1.  Essential HTN:  BP mildly elevated today.  He says that pressures have been trending over 140 @ home.  In that setting, I will increase amlodipine to 5mg  daily.  He remains on lisinopril 40mg  daily.  He will watch for any change in baseline level of lower ext swelling with the higher dose of amlodipine.  If that develops, we can switch to carvedilol.  2.  Tob abuse:  Smoking just under a pack/day.  He previously had success w/ chantix and would like to try this again.  Rx provided today.  3.  Mild Ao Stenosis:  F/u echo as it has been three years.  Asymptomatic.  4.  Chronic venous insufficiency: stable w/ support hose.    5.  HL:  On statin and followed by primary care.  6.  Disposition: Follow-up echocardiogram as above.  Follow-up in clinic in 6 months or sooner if necessary.   [name], NP 04/19/2019, 3:55 PM

## 2019-04-20 NOTE — Addendum Note (Signed)
Addended by: Alba Destine on: 04/20/2019 09:59 AM   Modules accepted: Orders

## 2019-04-24 ENCOUNTER — Other Ambulatory Visit: Payer: Self-pay | Admitting: Cardiovascular Disease

## 2019-05-12 ENCOUNTER — Telehealth: Payer: Self-pay

## 2019-05-12 NOTE — Telephone Encounter (Signed)
° ° °  COVID-19 Pre-Screening Questions:   In the past 7 to 10 days have you had a cough,  shortness of breath, headache, congestion, fever (100 or greater) body aches, chills, sore throat, or sudden loss of taste or sense of smell? no  Have you been around anyone with known Covid 19. no  Have you been around anyone who is awaiting Covid 19 test results in the past 7 to 10 days? no  Have you been around anyone who has been exposed to Covid 19, or has mentioned symptoms of Covid 19 within the past 7 to 10 days?  If you have any concerns/questions about symptoms patients report during screening (either on the phone or at [facility]). Contact the provider seeing the patient or DOD for further guidance.  If neither are available contact a member of the leadership team.

## 2019-05-12 NOTE — Telephone Encounter (Signed)
No to all screening questions

## 2019-05-13 ENCOUNTER — Ambulatory Visit (INDEPENDENT_AMBULATORY_CARE_PROVIDER_SITE_OTHER): Payer: 59

## 2019-05-13 ENCOUNTER — Other Ambulatory Visit: Payer: Self-pay

## 2019-05-13 DIAGNOSIS — I350 Nonrheumatic aortic (valve) stenosis: Secondary | ICD-10-CM

## 2019-05-16 NOTE — Progress Notes (Unsigned)
error 

## 2019-05-17 ENCOUNTER — Telehealth: Payer: Self-pay

## 2019-05-17 DIAGNOSIS — I77810 Thoracic aortic ectasia: Secondary | ICD-10-CM

## 2019-05-17 DIAGNOSIS — I10 Essential (primary) hypertension: Secondary | ICD-10-CM

## 2019-05-17 NOTE — Telephone Encounter (Signed)
Pt made aware of echo results and Ignacia Bayley, NP recommendation. Pt is agreeable with having the CTA of the chest. Adv the pt that I will contact the radiology dept to schedule and call him back with the information.   Pt aware that he chest CTA is scheduled at [facility] on 05/26/19 @ 1pm. Pt adv to arrive 1min to his appt. [facility] [place] [facility] [place] [place] [facility] [place] or [place]. Pt adv that there is noting to eat or drink 4 hours prior.  Pt verbalized understanding to the instructions given. No questions or concerns at this time. COVID screening done.       COVID-19 Pre-Screening Questions:  . In the past 7 to 10 days have you had a cough,  shortness of breath, headache, congestion, fever (100 or greater) body aches, chills, sore throat, or sudden loss of taste or sense of smell? No . Have you been around anyone with known Covid 19. No . Have you been around anyone who is awaiting Covid 19 test results in the past 7 to 10 days? No  . Have you been around anyone who has been exposed to Covid 19, or has mentioned symptoms of Covid 19 within the past 7 to 10 days? No

## 2019-05-17 NOTE — Telephone Encounter (Signed)
-----   Message from Theora Gianotti, NP sent at 05/16/2019  9:09 AM EDT ----- Nl heart squeezing fxn.  The main pumping chamber (left ventricle) is stiff, which means we need to make sure that HR and bp are well-controlled - we increased amlodipine at last visit.  Aortic stenosis has progressed some from mild to moderate.  Will continue to watch this with annual echo.  The aortic root has increased in size from 3.9cm in 2017 to 4.45cm this year. This being the case, we should obtain a CTA of the chest/aorta to better eval and establish a CT baseline.  This will require once to twice yearly imaging in the coming years depending upon size.

## 2019-05-23 ENCOUNTER — Other Ambulatory Visit
Admission: RE | Admit: 2019-05-23 | Discharge: 2019-05-23 | Disposition: A | Discharge status: Home / Self Care | Payer: 59 | Attending: Nurse Practitioner | Admitting: Nurse Practitioner

## 2019-05-23 ENCOUNTER — Encounter: Payer: Self-pay | Admitting: *Deleted

## 2019-05-23 ENCOUNTER — Other Ambulatory Visit: Payer: Self-pay

## 2019-05-23 DIAGNOSIS — I10 Essential (primary) hypertension: Secondary | ICD-10-CM | POA: Diagnosis present

## 2019-05-23 DIAGNOSIS — I77810 Thoracic aortic ectasia: Secondary | ICD-10-CM

## 2019-05-23 LAB — BASIC METABOLIC PANEL
Anion gap: 8 (ref 5–15)
BUN: 12 mg/dL (ref 8–23)
CO2: 25 mmol/L (ref 22–32)
Calcium: 8.8 mg/dL — ABNORMAL LOW (ref 8.9–10.3)
Chloride: 104 mmol/L (ref 98–111)
Creatinine, Ser: 1.07 mg/dL (ref 0.61–1.24)
GFR calc Af Amer: >60 mL/min (ref >60)
GFR calc non Af Amer: >60 mL/min (ref >60)
Glucose, Bld: 129 mg/dL — ABNORMAL HIGH (ref 70–99)
Potassium: 4.8 mmol/L (ref 3.5–5.1)
Sodium: 137 mmol/L (ref 135–145)

## 2019-05-26 ENCOUNTER — Other Ambulatory Visit: Payer: Self-pay

## 2019-05-26 ENCOUNTER — Ambulatory Visit
Admission: RE | Admit: 2019-05-26 | Discharge: 2019-05-26 | Disposition: A | Discharge status: Home / Self Care | Payer: 59 | Source: Ambulatory Visit | Attending: Nurse Practitioner | Admitting: Nurse Practitioner

## 2019-05-26 DIAGNOSIS — I77810 Thoracic aortic ectasia: Secondary | ICD-10-CM | POA: Insufficient documentation

## 2019-05-26 DIAGNOSIS — I10 Essential (primary) hypertension: Secondary | ICD-10-CM | POA: Diagnosis present

## 2019-05-26 MED ORDER — IOPAMIDOL (ISOVUE-370) INJECTION 76%
100.0000 mL | Freq: Once | INTRAVENOUS | Status: AC | PRN
  Administered 2019-05-26: 100 mL via INTRAVENOUS

## 2019-05-27 ENCOUNTER — Telehealth: Payer: Self-pay | Admitting: Family Medicine

## 2019-05-27 DIAGNOSIS — E041 Nontoxic single thyroid nodule: Secondary | ICD-10-CM

## 2019-05-27 DIAGNOSIS — K760 Fatty (change of) liver, not elsewhere classified: Secondary | ICD-10-CM

## 2019-05-27 NOTE — Telephone Encounter (Signed)
Pt advised.   Thanks,   -Ellanor Feuerstein  

## 2019-05-27 NOTE — Telephone Encounter (Signed)
Please advise patient received copy chest CT showing thyroid nodule.  He needs thyroid ultrasound for further evaluation which has been ordered. Also need to schedule o.v. for labs or CPE this month or next.

## 2019-06-01 ENCOUNTER — Ambulatory Visit
Admission: RE | Admit: 2019-06-01 | Discharge: 2019-06-01 | Disposition: A | Discharge status: Home / Self Care | Payer: 59 | Source: Ambulatory Visit | Attending: Family Medicine | Admitting: Family Medicine

## 2019-06-01 ENCOUNTER — Other Ambulatory Visit: Payer: Self-pay

## 2019-06-01 DIAGNOSIS — E041 Nontoxic single thyroid nodule: Secondary | ICD-10-CM | POA: Diagnosis not present

## 2019-06-06 ENCOUNTER — Telehealth: Payer: Self-pay

## 2019-06-06 DIAGNOSIS — E785 Hyperlipidemia, unspecified: Secondary | ICD-10-CM

## 2019-06-06 DIAGNOSIS — E041 Nontoxic single thyroid nodule: Secondary | ICD-10-CM

## 2019-06-06 NOTE — Telephone Encounter (Signed)
That's a good idea. Have added hepatic panel. Please print and leave for him a the lab.

## 2019-06-06 NOTE — Telephone Encounter (Signed)
Pt advised.  Do you want his liver function checked as well?   Thanks,   -Mickel Baas

## 2019-06-06 NOTE — Telephone Encounter (Signed)
printed

## 2019-06-06 NOTE — Telephone Encounter (Signed)
-----   Message from Birdie Sons, MD sent at 06/02/2019 10:38 AM EDT ----- Small thyroid nodule, about 2cm that appears to be benign. Recommendations are to recheck in one year to make sure it remains benign. Also need to check thyroid functions to make sure nodule is not affecting them. It looks like he is also due for fasting lipids, so need to order lipids, free T4 and TSH and stop by lab some morning when he is fasting.

## 2019-06-08 ENCOUNTER — Telehealth: Payer: Self-pay

## 2019-06-08 LAB — HEPATIC FUNCTION PANEL
ALT: 41 IU/L (ref 0–44)
AST: 24 IU/L (ref 0–40)
Albumin: 4.5 g/dL (ref 3.8–4.8)
Alkaline Phosphatase: 61 IU/L (ref 39–117)
Bilirubin Total: 0.5 mg/dL (ref 0.0–1.2)
Bilirubin, Direct: 0.18 mg/dL (ref 0.00–0.40)
Total Protein: 6.5 g/dL (ref 6.0–8.5)

## 2019-06-08 LAB — LIPID PANEL
Chol/HDL Ratio: 2.8 ratio (ref 0.0–5.0)
Cholesterol, Total: 139 mg/dL (ref 100–199)
HDL: 49 mg/dL (ref >39)
LDL Calculated: 58 mg/dL (ref 0–99)
Triglycerides: 162 mg/dL — ABNORMAL HIGH (ref 0–149)
VLDL Cholesterol Cal: 32 mg/dL (ref 5–40)

## 2019-06-08 LAB — TSH+FREE T4
Free T4: 1.12 ng/dL (ref 0.82–1.77)
TSH: 1.49 u[IU]/mL (ref 0.450–4.500)

## 2019-06-08 NOTE — Telephone Encounter (Signed)
-----   Message from Birdie Sons, MD sent at 06/08/2019  7:56 AM EDT ----- Cholesterol is well controlled at 139. Liver functions and thyroid functions are normal. Continue current medications.  Check labs yearly.

## 2019-06-08 NOTE — Telephone Encounter (Signed)
Patient advised as below.  

## 2019-09-09 ENCOUNTER — Other Ambulatory Visit: Payer: Self-pay

## 2019-09-09 MED ORDER — LISINOPRIL 40 MG PO TABS: 40.0000 mg | ORAL_TABLET | Freq: Every day | ORAL | 0 refills | Status: DC

## 2019-10-24 ENCOUNTER — Other Ambulatory Visit: Payer: Self-pay | Admitting: *Deleted

## 2019-10-24 MED ORDER — ATORVASTATIN CALCIUM 20 MG PO TABS: 20.0000 mg | ORAL_TABLET | Freq: Every day | ORAL | 0 refills | Status: DC

## 2019-11-23 ENCOUNTER — Other Ambulatory Visit: Payer: Self-pay

## 2019-11-23 MED ORDER — ATORVASTATIN CALCIUM 20 MG PO TABS: 20.0000 mg | ORAL_TABLET | Freq: Every day | ORAL | 0 refills | Status: DC

## 2019-11-30 NOTE — Progress Notes (Signed)
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    Past Medical History    Past Medical History:  Diagnosis Date   Arthritis    "maybe" - hands   Chest pain    a. ETT 06/14/2015: no st segment or T-waves changes during stress, mildly reduced exercise capacity, hypertensive response to exercise   Chicken pox    Chronic venous insufficiency    a. improved with support hose.   Erectile dysfunction    Essential hypertension    CONTROLLED ON MEDS   History of echocardiogram    a. 03/2016 Echo: EF 55-60%, no rwma, mild AS, triv AI, mildly dil Ao.   HLD (hyperlipidemia)    Measles    Mumps    Polysubstance abuse ([place])    a. ongoing tobacco and alcohol abuse   Wears dentures    full upper   Past Surgical History:  Procedure Laterality Date   APPENDECTOMY  1972   COLONOSCOPY WITH PROPOFOL N/A 08/27/2015   Procedure: COLONOSCOPY WITH PROPOFOL;  Surgeon: Lucilla Lame, MD;  Location: [place];  Service: Endoscopy;  Laterality: N/A;   POLYPECTOMY  08/27/2015   Procedure: POLYPECTOMY;  Surgeon: Lucilla Lame, MD;  Location: [place];  Service: Endoscopy;;    Allergies  No Known Allergies  History of Present Illness    64 yo male with the above PMH. He underwent nl stress test in 2016. He was last seen in the clinic 04/19/2019. He had started smoking again 2/2 stress and was interested in going back on Chantix. He was smoking just under 1 pack a day. He was working full time. He did not  routinely exercise but denied any issues with his daily activities. Due to mild elevation in BP, his amlodipine was increased to 75m daily. It was noted that if he had increase in baseline LEE, amlodipine could be transitioned to Coreg. He was using support hose for chronic venous insufficiency. A follow-up echo was ordered, which showed progression of AS from mild to moderate. In addition, it showed increase in aortic root size from 3.9cm in 2017 to 4.45cm; therefore, CTA of chest/aorta was ordered and showed stable mild dilation of the ascending aorta to 3.9cm, similar to that in 2017. It also showed 1cm hypodensity left lobe thyroid nodule with recommendation of thyroid ultrasound for further evaluation. Follow-up ultrasound of the thyroid showed 2.1cm category 3 nodule in the L superior gland that met criteria for follow-up thyroid ultrasound in one year (~05/2020).   Since that time, he reports that he has been doing fairly well. He denies palpitations, racing HR, presyncope, or syncope.  He reports brief MSK pain that has not recurred and was not concerning to him.  It was described as a pulled muscle and occurred sometime ago.  No SOB or DOE. He denies a regular workout routine but does wish to start walking once the weather warms up. He is still smoking but has successfully cut back to 1-2 cigarettes a day and is still working towards  complete cessation. He does not feel that he needs a prescription for Chantix at this time and will call the office if this changes. He reports possible barriers to complete cessation are that those around him still smoke and the high stress of his job. He is in agreement that he will find it easier to completely stop smoking if he increases physical activity. He is drinking 3-4 beers a night, which he will work towards cutting back on. He reports SBP 127-140s and DBP 70-80. He is not adding salt to his food and reports that he prefers low salt food and can taste if food is  too salty. He does admit to occasionally eating fried food, which he knows is not the best; therefore, he does not do often. He reports medication compliance. No s/sx of acute bleeding.   Home Medications    Prior to Admission medications   Medication Sig Start Date End Date Taking? Authorizing Provider  amLODipine (NORVASC) 5 MG tablet Take 1 tablet (5 mg total) by mouth daily. 04/19/19 07/18/19  Theora Gianotti, NP  aspirin 81 MG tablet Take 81 mg by mouth daily. AM    [provider]  atorvastatin (LIPITOR) 20 MG tablet Take 1 tablet (20 mg total) by mouth daily. Please call to schedule appointment for further refills. Thank you! 11/23/19   Wellington Hampshire, MD  CINNAMON PO Take by mouth. AM    [provider]  lisinopril (ZESTRIL) 40 MG tablet Take 1 tablet (40 mg total) by mouth daily. 09/09/19   Wellington Hampshire, MD  Multiple Vitamin (MULTIVITAMIN) capsule Take 1 capsule by mouth daily. AM    [provider]  Omega-3 Fatty Acids (FISH OIL) 1000 MG CAPS Take 1 capsule by mouth.    [provider]  varenicline (CHANTIX CONTINUING MONTH PAK) 1 MG tablet Take 1 tablet (1 mg total) by mouth 2 (two) times daily. To start after completion of CHantix starter pack. 04/19/19   Theora Gianotti, NP  varenicline (CHANTIX STARTING MONTH PAK) 0.5 MG X 11 & 1 MG X 42 tablet Take one 0.5 mg tablet by mouth once daily for 3 days, then increase to one 0.5 mg tablet twice daily for 4 days, then increase to one 1 mg tablet twice daily. 04/19/19   Theora Gianotti, NP    Review of Systems    He denies chest pain, palpitations, dyspnea, pnd, orthopnea, n, v, dizziness, syncope, edema, weight gain, or early satiety.  All other systems reviewed and are otherwise negative except as noted above.  Physical Exam    VS:  BP 138/74 (BP Location: Left Arm, Patient Position: Sitting, Cuff Size: Normal)    Pulse 88    Temp 98.2 F (36.8 C)    Ht _0  (1.854  m)    Wt 233 lb 8 oz (105.9 kg)    BMI 30.81 kg/m  , BMI Body mass index is 30.81 kg/m. GEN: Well nourished, well developed, in no acute distress. HEENT: normal. Neck: Supple, no JVD, carotid bruits, or masses. Cardiac: RRR, 2/6 systolic murmur. No rubs or gallops. No clubbing, cyanosis, edema.  Radials/DP/PT 2+ and equal bilaterally.  Respiratory:  Respirations regular and unlabored, clear to auscultation bilaterally. GI: Soft, nontender, nondistended, BS + x 4. MS: no deformity or atrophy. Skin: warm and dry, no rash. Neuro:  Strength and sensation are intact. Psych: Normal affect.  Accessory Clinical Findings    ECG personally reviewed by me today - NSR,  88bpm - no acute changes.  Filed Weights   12/01/19 0800  Weight: 233 lb 8 oz (105.9 kg)     Echo 05/13/2019 1. The left ventricle has normal systolic function, with an ejection  fraction of 55-60%. The cavity size was normal. There is mildly increased  left ventricular wall thickness of the septal wall. Left ventricular  diastolic Doppler parameters are  consistent with pseudonormalization.  2. The right ventricle has normal systolic function. The cavity was  normal. There is no increase in right ventricular wall thickness. Unable  to estimate RVSP.  3. Moderate stenosis of the aortic valve. Mean gradient of 24 mm Hg, peak  gradient of 44 mm Hg, estimated AVA 1.3 cmsq  4. There is dilatation of the aortic root 4.5 cm and of the ascending  aorta 4.2 cm.   CTA 05/2019 IMPRESSION: Mild dilatation of the ascending aorta to 3.9 cm. No evidence of dissection is noted. 1 cm hypodensity within the left lobe of the thyroid. Nonemergent ultrasound can be performed as clinically indicated. Fatty infiltration of the liver with multifocal areas of enhancement consistent with hemangiomas.  US Thyroid 05/2019 IMPRESSION: 1. Solitary 2.1 cm TI-RADS category 3 nodule in the left superior gland meets criteria for follow-up  ultrasound in 1 year.   Labs 05/2019: AST 24, ALT 41. Lipid: total cholesterol 139, TG 162, HDL 49, LDL 58, TSH 1.4, FT4 1.12, Na 137, K 4.8, glucose 129, Cr 1.07, BUN 12,   Assessment & Plan    Hypertension --BP today 138/74 and mildly elevated.  Reports SBP usually in the 130s.  Given his history of dilation of the aorta, strict BP and heart rate control recommended.  He will avoid salt and watch his fluid intake. He denies any increase in lower extremity edema with the increase of amlodipine from 2.5 mg to 5 mg qd; therefore, we will increase his amlodipine to 10 mg daily for extra BP support. He will call the office if he notes increased LEE with this increased dose.  Continue BB, ACE, amlodipine.   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  05/2019 CTA showed mild dilation of the ascending aorta to 3.9 cm.  Continue to monitor with annual CT/MR and periodic echo given valvular dz. We will continue to control blood pressure and HR, as well as continue his statin for risk factor modification. Avoid FQ. Smoking cessation / alcohol cessation recommended. Lifestyle / diet and exercise recommendations discussed.   Aortic stenosis, moderate --Asymptomatic. We will continue to monitor with periodic annual echo (sooner if symptoms of worsening valvular dz, which were discussed).  Last echo as above 04/2019 and showed escalation of aortic stenosis from mild to moderate. Repeat echo in July/August 2021.  HLD --05/2019 labs showed LDL 58.  Continue statin.  Tobacco use --He has cut back smoking to 1 to 2 cigarettes daily and will continue to work toward quitting smoking with goal of complete cessation. He will let us know if he needs a Chantix rx. Cessation recommended.  Alcohol use --Cessation recommended with patient understanding, as this will help his BP control and overall cardiovascular health.   Thyroid nodule -Solitary 2.1  cm category 3 nodule in the left superior gland, meeting criteria for follow-up ultrasound in 1 year.  Will defer follow-up imaging to PCP.  Atypical chest pain --Reports brief chest pain, described as MSK in etiology and not concerning to him.  Was unable  to recall the exact timeframe of this chest pain.  It has not recurred.  No associated symptoms.  Continue current medications.  Chronic venous insufficiency --Stable.  No increase in LEE with amlodipine.  He will let us know if he notes an increase in edema with increased dose amlodipine.  Disposition: Increase amlodipine to 3m daily. Follow-up CT/MR and echocardiogram in ~1 year to monitor aortic dilation and valvular dz. Thyroid nodule follow-up per PCP. Follow-up in ~1 year or sooner if needed.  JArvil Chaco PA-C 12/01/2019, 8:11 AM

## 2019-12-01 ENCOUNTER — Encounter: Payer: Self-pay | Admitting: Physician Assistant

## 2019-12-01 ENCOUNTER — Other Ambulatory Visit: Payer: Self-pay

## 2019-12-01 ENCOUNTER — Ambulatory Visit (INDEPENDENT_AMBULATORY_CARE_PROVIDER_SITE_OTHER): Payer: No Typology Code available for payment source | Admitting: Physician Assistant

## 2019-12-01 VITALS — BP 138/74 | HR 88 | Temp 98.2°F | Ht 73.0 in | Wt 233.5 lb

## 2019-12-01 DIAGNOSIS — E785 Hyperlipidemia, unspecified: Secondary | ICD-10-CM

## 2019-12-01 DIAGNOSIS — R0789 Other chest pain: Secondary | ICD-10-CM

## 2019-12-01 DIAGNOSIS — I350 Nonrheumatic aortic (valve) stenosis: Secondary | ICD-10-CM | POA: Diagnosis not present

## 2019-12-01 DIAGNOSIS — I77810 Thoracic aortic ectasia: Secondary | ICD-10-CM

## 2019-12-01 DIAGNOSIS — I10 Essential (primary) hypertension: Secondary | ICD-10-CM

## 2019-12-01 DIAGNOSIS — E041 Nontoxic single thyroid nodule: Secondary | ICD-10-CM

## 2019-12-01 DIAGNOSIS — Z720 Tobacco use: Secondary | ICD-10-CM

## 2019-12-01 DIAGNOSIS — Z7289 Other problems related to lifestyle: Secondary | ICD-10-CM

## 2019-12-01 DIAGNOSIS — Z789 Other specified health status: Secondary | ICD-10-CM

## 2019-12-01 DIAGNOSIS — F1090 Alcohol use, unspecified, uncomplicated: Secondary | ICD-10-CM

## 2019-12-01 MED ORDER — AMLODIPINE BESYLATE 10 MG PO TABS: 10.0000 mg | ORAL_TABLET | Freq: Every day | ORAL | 3 refills | Status: DC

## 2019-12-01 MED ORDER — ATORVASTATIN CALCIUM 20 MG PO TABS: 20.0000 mg | ORAL_TABLET | Freq: Every day | ORAL | 3 refills | Status: DC

## 2019-12-01 MED ORDER — LISINOPRIL 40 MG PO TABS: 40.0000 mg | ORAL_TABLET | Freq: Every day | ORAL | 3 refills | Status: DC

## 2019-12-01 NOTE — Patient Instructions (Addendum)
Medication Instructions:  Your physician has recommended you make the following change in your medication:  1. INCREASE Amlodipine 10 mg once daily   *If you need a refill on your cardiac medications before your next appointment, please call your pharmacy*  Lab Work: None  If you have labs (blood work) drawn today and your tests are completely normal, you will receive your results only by: Marland Kitchen MyChart Message (if you have MyChart) OR . A paper copy in the mail If you have any lab test that is abnormal or we need to change your treatment, we will call you to review the results.  Testing/Procedures: None  Follow-Up: At [facility], you and your health needs are our priority.  As part of our continuing mission to provide you with exceptional heart care, we have created designated Provider Care Teams.  These Care Teams include your primary Cardiologist (physician) and Advanced Practice Providers (APPs -  Physician Assistants and Nurse Practitioners) who all work together to provide you with the care you need, when you need it.  Your next appointment:   12 month(s)  The format for your next appointment:   In Person  Provider:    You may see [name], MD or one of the following Advanced Practice Providers on your designated Care Team:    [name], NP  [name], PA-C  [name], PA-C     How to Take Your Blood Pressure You can take your blood pressure at home with a machine. You may need to check your blood pressure at home:  To check if you have high blood pressure (hypertension).  To check your blood pressure over time.  To make sure your blood pressure medicine is working. Supplies needed: You will need a blood pressure machine, or monitor. You can buy one at a drugstore or online. When choosing one:  Choose one with an arm cuff.  Choose one that wraps around your upper arm. Only one finger should fit between your arm and the cuff.  Do not  choose one that measures your blood pressure from your wrist or finger. Your doctor can suggest a monitor. How to prepare Avoid these things for 30 minutes before checking your blood pressure:  Drinking caffeine.  Drinking alcohol.  Eating.  Smoking.  Exercising. Five minutes before checking your blood pressure:  Pee.  Sit in a dining chair. Avoid sitting in a soft couch or armchair.  Be quiet. Do not talk. How to take your blood pressure Follow the instructions that came with your machine. If you have a digital blood pressure monitor, these may be the instructions: 1. Sit up straight. 2. Place your feet on the floor. Do not cross your ankles or legs. 3. Rest your left arm at the level of your heart. You may rest it on a table, desk, or chair. 4. Pull up your shirt sleeve. 5. Wrap the blood pressure cuff around the upper part of your left arm. The cuff should be 1 inch (2.5 cm) above your elbow. It is best to wrap the cuff around bare skin. 6. Fit the cuff snugly around your arm. You should be able to place only one finger between the cuff and your arm. 7. Put the cord inside the groove of your elbow. 8. Press the power button. 9. Sit quietly while the cuff fills with air and loses air. 10. Write down the numbers on the screen. 11. Wait 2-3 minutes and then repeat steps 1-10. What do  the numbers mean? Two numbers make up your blood pressure. The first number is called systolic pressure. The second is called diastolic pressure. An example of a blood pressure reading is "120 over 80" (or 120/80). If you are an adult and do not have a medical condition, use this guide to find out if your blood pressure is normal: Normal  First number: below 120.  Second number: below 80. Elevated  First number: 120-129.  Second number: below 80. Hypertension stage 1  First number: 130-139.  Second number: 80-89. Hypertension stage 2  First number: 140 or above.  Second number: 12  or above. Your blood pressure is above normal even if only the top or bottom number is above normal. Follow these instructions at home:  Check your blood pressure as often as your doctor tells you to.  Take your monitor to your next doctor's appointment. Your doctor will: ? Make sure you are using it correctly. ? Make sure it is working right.  Make sure you understand what your blood pressure numbers should be.  Tell your doctor if your medicines are causing side effects. Contact a doctor if:  Your blood pressure keeps being high. Get help right away if:  Your first blood pressure number is higher than 180.  Your second blood pressure number is higher than 120. This information is not intended to replace advice given to you by your health care provider. Make sure you discuss any questions you have with your health care provider. Document Revised: 09/18/2017 Document Reviewed: 03/14/2016 Elsevier Patient Education  2020 [employer].

## 2020-05-21 ENCOUNTER — Telehealth: Payer: Self-pay | Admitting: Family Medicine

## 2020-05-21 DIAGNOSIS — E041 Nontoxic single thyroid nodule: Secondary | ICD-10-CM

## 2020-05-21 NOTE — Telephone Encounter (Signed)
Please advise patient it is time for annual thyroid ultrasound to recheck thyroid nodules. He should be getting a call from Judson Roch about this in the next few days. He is also do for yearly follow up of meds and labs. He can either schedule follow up office visit this month, or next available CPE which may be a few months.

## 2020-05-22 NOTE — Telephone Encounter (Signed)
Patient advised. He states he will call back to schedule a CPE.

## 2020-06-04 ENCOUNTER — Ambulatory Visit
Admission: RE | Admit: 2020-06-04 | Discharge: 2020-06-04 | Disposition: A | Discharge status: Home / Self Care | Payer: No Typology Code available for payment source | Source: Ambulatory Visit | Attending: Family Medicine | Admitting: Family Medicine

## 2020-06-04 ENCOUNTER — Other Ambulatory Visit: Payer: Self-pay

## 2020-06-04 DIAGNOSIS — E041 Nontoxic single thyroid nodule: Secondary | ICD-10-CM | POA: Insufficient documentation

## 2020-07-03 ENCOUNTER — Telehealth: Payer: Self-pay | Admitting: Family Medicine

## 2020-07-03 DIAGNOSIS — E041 Nontoxic single thyroid nodule: Secondary | ICD-10-CM

## 2020-07-03 NOTE — Telephone Encounter (Signed)
Please advise patient it is time for annual follow up of thyroid nodule to make sure it does not grow or progress to a tumor. Order has been placed, he should get a call from [place] in the next day or two.

## 2020-07-03 NOTE — Telephone Encounter (Signed)
Pt reports that he has already had this done in 05/2020.

## 2020-07-09 ENCOUNTER — Ambulatory Visit: Payer: No Typology Code available for payment source

## 2020-11-01 IMAGING — US US THYROID
1 series · 13 of 25 positions shown · non-contrast
Comparison: CT scan of the chest 05/26/2019

CLINICAL DATA: Incidental on CT.

EXAM:
THYROID ULTRASOUND
TECHNIQUE: Ultrasound examination of the thyroid gland and adjacent soft
tissues was performed.

[Series 1: us thyroid · 13 of 56 slices shown]
[im 1/56]
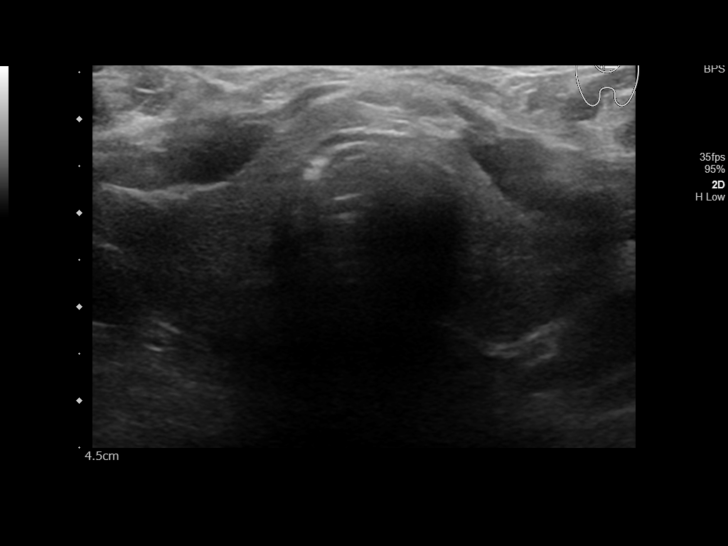
[im 5/56]
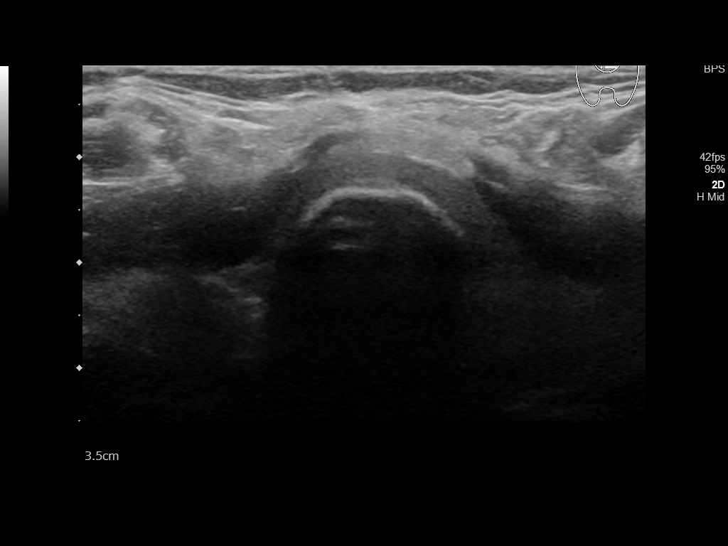
[im 10/56]
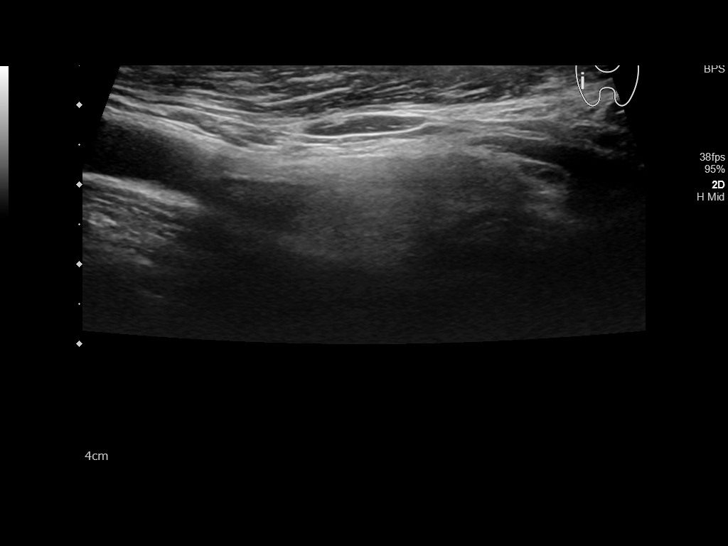
[im 14/56]
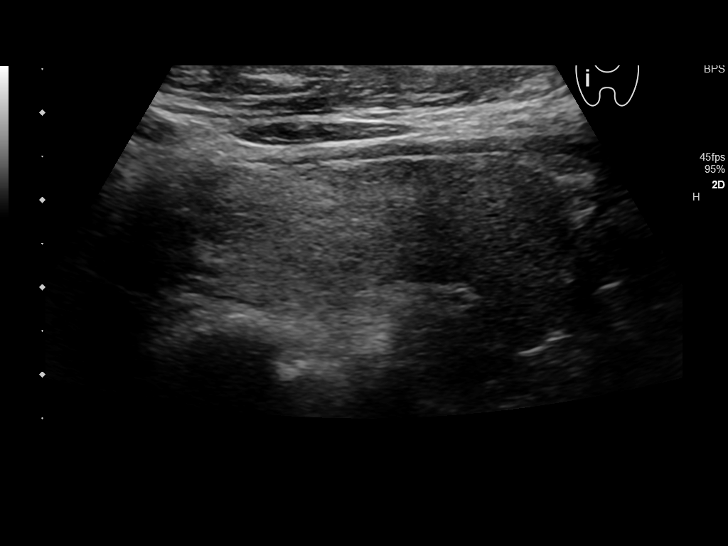
[im 19/56]
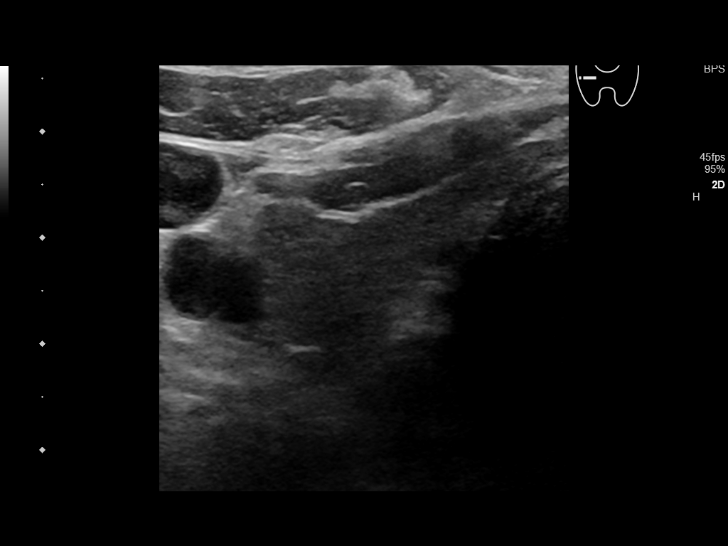
[im 23/56]
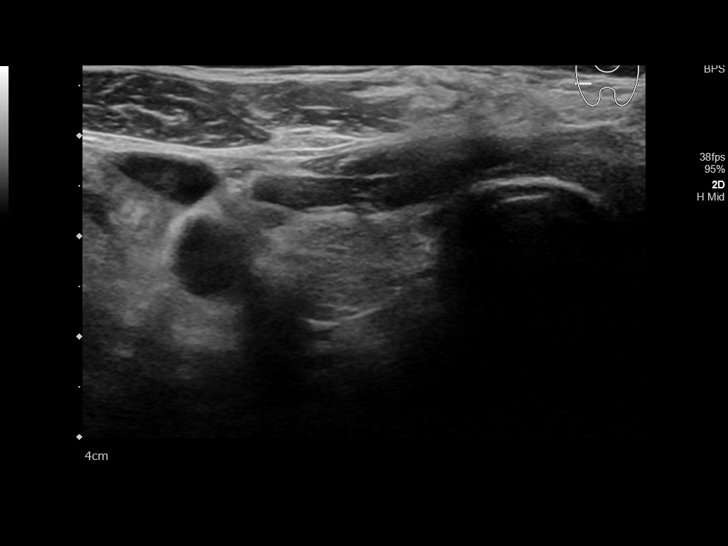
[im 28/56]
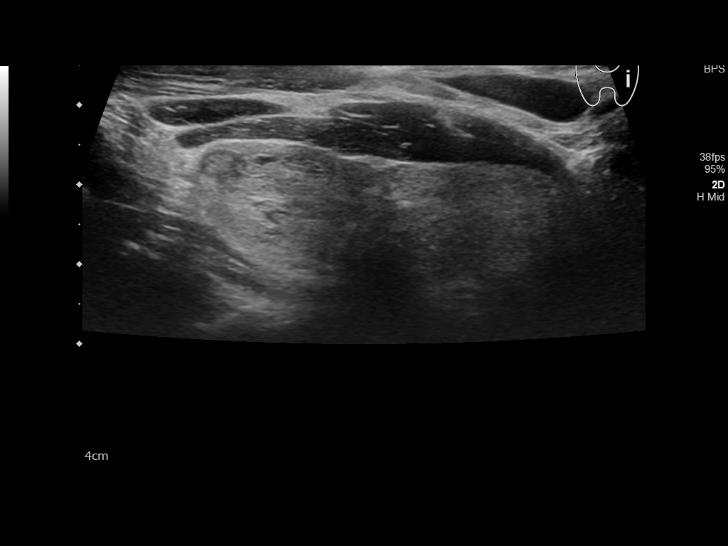
[im 33/56]
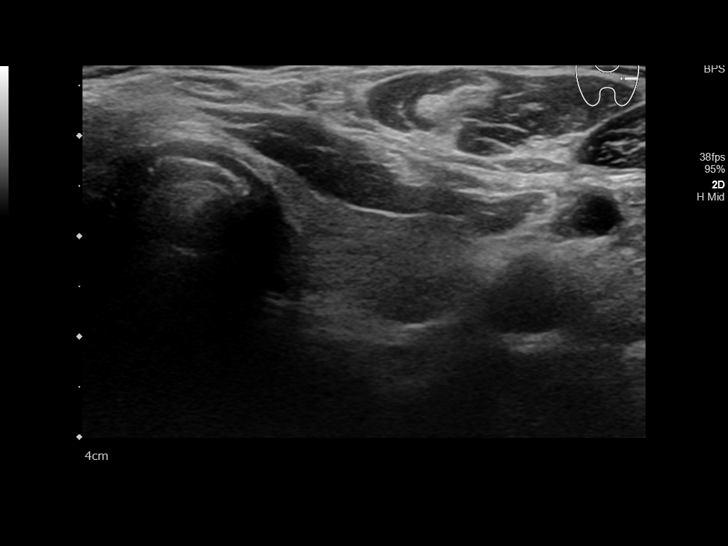
[im 37/56]
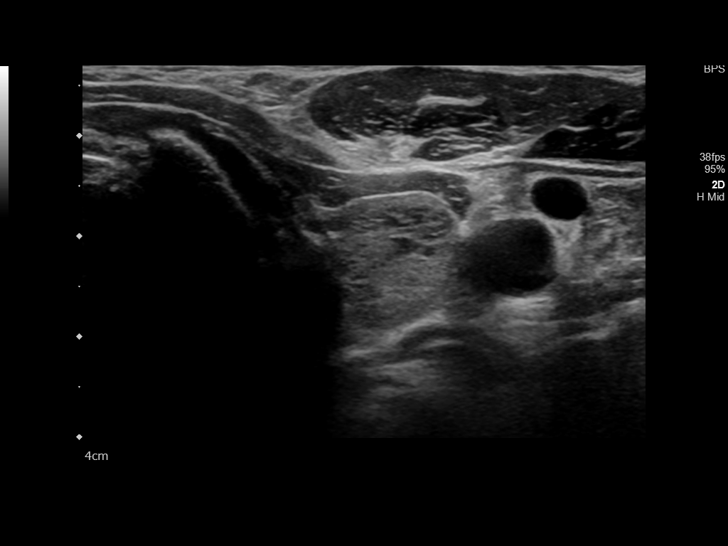
[im 42/56]
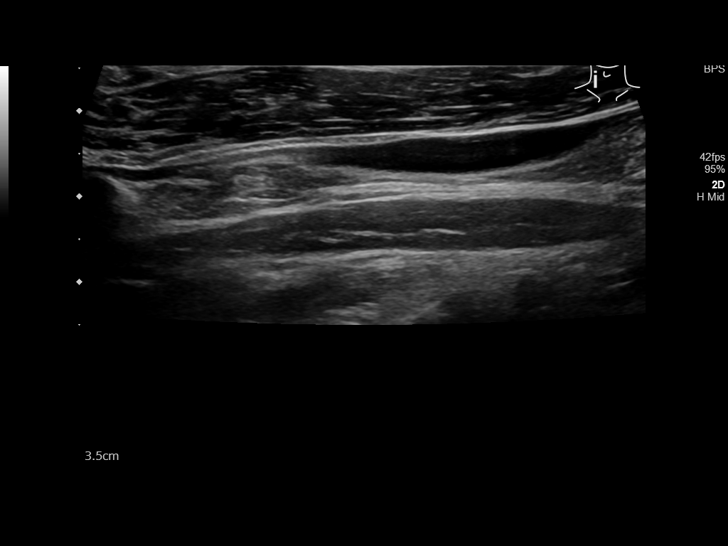
[im 46/56]
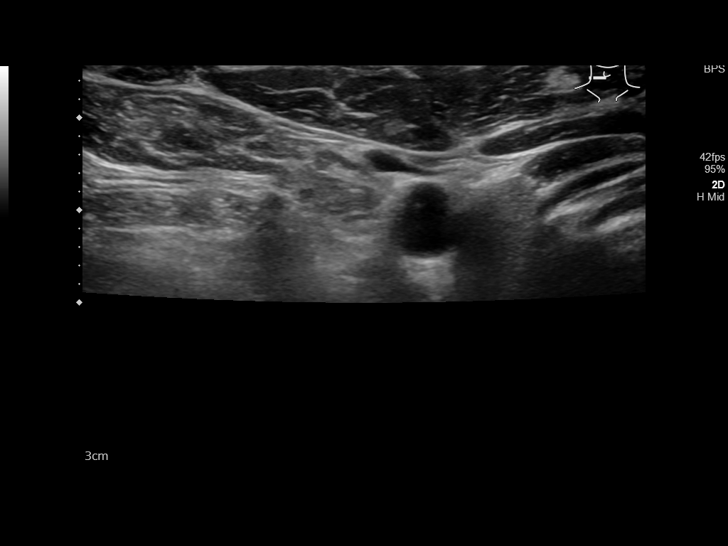
[im 51/56]
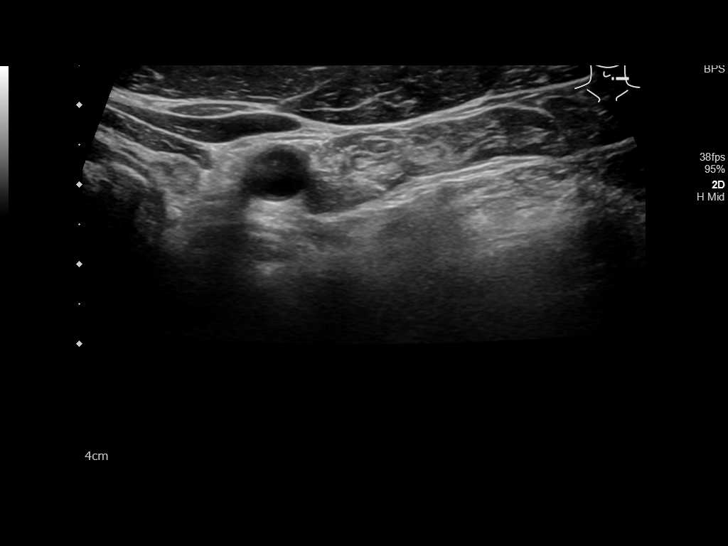
[im 56/56]
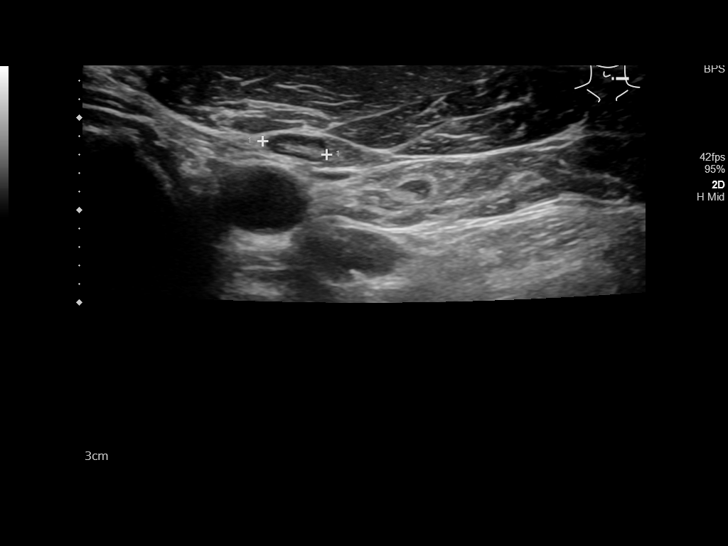

[13 of 25 positions shown; findings below may reference images not displayed]

FINDINGS: Parenchymal Echotexture: Mildly heterogenous

Isthmus: 0.3 cm

Right lobe: 4.6 x 1.5 x 1.6 cm

Left lobe: 4.7 x 1.4 x 1.9 cm

_________________________________________________________

Estimated total number of nodules >/= 1 cm: 1

Number of spongiform nodules >/=  2 cm not described below (TR1): 0

Number of mixed cystic and solid nodules >/= 1.5 cm not described
below (TR2): 0

_________________________________________________________

Nodule # 1:

Location: Left; Superior

Maximum size: 2.1 cm; Other 2 dimensions: 1.5 x 1.3 cm

Composition: solid/almost completely solid (2)

Echogenicity: isoechoic (1)

Shape: not taller-than-wide (0)

Margins: smooth (0)

Echogenic foci: none (0)

ACR TI-RADS total points: 3.

ACR TI-RADS risk category: TR3 (3 points).

ACR TI-RADS recommendations:

*Given size (>/= 1.5 - 2.4 cm) and appearance, a follow-up
ultrasound in 1 year should be considered based on TI-RADS criteria.

_________________________________________________________
IMPRESSION: 1. Solitary 2.1 cm TI-RADS category 3 nodule in the left superior
gland meets criteria for follow-up ultrasound in 1 year.

The above is in keeping with the ACR TI-RADS recommendations - [HOSPITAL] 6592;[DATE].

## 2020-11-28 NOTE — Progress Notes (Signed)
Cardiology Office Note   Date:  11/29/2020   ID:  Chase [name], DOB 13-Apr-1956, MRN [id number]  PCP:  Birdie Sons, MD  Cardiologist:   [name], MD   Chief Complaint  Patient presents with   Annual Exam      History of Present Illness: Chase [name] is a 65 y.o. male who presents for A follow-up visit regarding aortic stenosis, mildly dilated aortic root and essential hypertension.  He has known history of hypertension, tobacco use, psoriasis, obesity, hyperlipidemia and erectile dysfunction. He smokes 2 packs per day for 40 years. He also drinks 6-8 beers a day.  He had a treadmill stress test in August 2016 for atypical chest pain. The stress test was normal with hypertensive response to exercise. Most recent echocardiogram in July 2020 showed an EF of 55 to 60% with moderate aortic stenosis with mean gradient of 24 mmHg and valve area of 1.3 cm. There was also mildly dilated aortic root. He underwent subsequent CT angiogram of the chest that showed that the ascending aorta was only 3.9 cm.  He has been doing well with no recent chest pain, worsening dyspnea or palpitations. He takes his medications regularly.  Past Medical History:  Diagnosis Date   Arthritis    "maybe" - hands   Chest pain    a. ETT 06/14/2015: no st segment or T-waves changes during stress, mildly reduced exercise capacity, hypertensive response to exercise   Chicken pox    Chronic venous insufficiency    a. improved with support hose.   Erectile dysfunction    Essential hypertension    CONTROLLED ON MEDS   History of echocardiogram    a. 03/2016 Echo: EF 55-60%, no rwma, mild AS, triv AI, mildly dil Ao.   HLD (hyperlipidemia)    Measles    Mumps    Polysubstance abuse ([place])    a. ongoing tobacco and alcohol abuse   Wears dentures    full upper    Past Surgical History:  Procedure Laterality Date   APPENDECTOMY  1972   COLONOSCOPY WITH PROPOFOL N/A 08/27/2015   Procedure:  COLONOSCOPY WITH PROPOFOL;  Surgeon: Lucilla Lame, MD;  Location: [place];  Service: Endoscopy;  Laterality: N/A;   POLYPECTOMY  08/27/2015   Procedure: POLYPECTOMY;  Surgeon: Lucilla Lame, MD;  Location: [place];  Service: Endoscopy;;     Current Outpatient Medications  Medication Sig Dispense Refill   aspirin 81 MG tablet Take 81 mg by mouth daily. AM     atorvastatin (LIPITOR) 20 MG tablet Take 1 tablet (20 mg total) by mouth daily at 6 PM. 90 tablet 3   CINNAMON PO Take by mouth. AM     lisinopril (ZESTRIL) 40 MG tablet Take 1 tablet (40 mg total) by mouth daily. 90 tablet 3   Multiple Vitamin (MULTIVITAMIN) capsule Take 1 capsule by mouth daily. AM     Omega-3 Fatty Acids (FISH OIL) 1000 MG CAPS Take 1 capsule by mouth.     amLODipine (NORVASC) 10 MG tablet Take 1 tablet (10 mg total) by mouth daily. 90 tablet 3   No current facility-administered medications for this visit.    Allergies:   Patient has no known allergies.    Social History:  The patient  reports that he has been smoking cigarettes. He started smoking about 50 years ago. He has a 90.00 pack-year smoking history. He has never used smokeless tobacco. He reports current alcohol use of about 30.0  standard drinks of alcohol per week. He reports that he does not use drugs.   Family History:  The patient's family history includes Heart disease in his mother; Pancreatic cancer in his father.    ROS:  Please see the history of present illness.   Otherwise, review of systems are positive for none.   All other systems are reviewed and negative.    PHYSICAL EXAM: VS:  BP 136/84    Pulse 76    Ht 6\' 1"  (1.854 m)    Wt 229 lb (103.9 kg)    BMI 30.21 kg/m  , BMI Body mass index is 30.21 kg/m. GEN: Well nourished, well developed, in no acute distress  HEENT: normal  Neck: no JVD, carotid bruits, or masses Cardiac: RRR; no  rubs, or gallops,no edema . 3/ 6 systolic ejection murmur in the aortic  area which is mid peaking. Respiratory:  clear to auscultation bilaterally, normal work of breathing GI: soft, nontender, nondistended, + BS MS: no deformity or atrophy  Skin: warm and dry, no rash Neuro:  Strength and sensation are intact Psych: euthymic mood, full affect   EKG:  EKG is ordered today. The ekg ordered today demonstrates normal sinus rhythm with no significant ST or T wave changes.   Recent Labs: No results found for requested labs within last 8760 hours.    Lipid Panel    Component Value Date/Time   CHOL 139 06/07/2019 0818   TRIG 162 (H) 06/07/2019 0818   HDL 49 06/07/2019 0818   CHOLHDL 2.8 06/07/2019 0818   CHOLHDL 3.7 01/21/2017 0643   VLDL 46 (H) 01/21/2017 0643   LDLCALC 58 06/07/2019 0818      Wt Readings from Last 3 Encounters:  11/29/20 229 lb (103.9 kg)  12/01/19 233 lb 8 oz (105.9 kg)  04/19/19 233 lb (105.7 kg)       No flowsheet data found.    ASSESSMENT AND PLAN:  1. Aortic valve disease with mildly dilated aortic root: I reviewed his most recent echocardiogram from 2020. I suspect that his aortic valve is bicuspid and not tricuspid. It was associated with moderate aortic stenosis at that time and I suspect some progression now. I do think that we have to determine if the valve is bicuspid or tricuspid and thus our next evaluation should be a transesophageal echocardiogram. I will plan on doing that in about 3 months once Covid numbers are down. That way, we will be able to evaluate his aorta as well.   2. Essential hypertension: blood pressure is controlled on current medications.  2. Hyperlipidemia: Significant improvement with atorvastatin with most recent LDL of 58.  3. Tobacco use: He cut down on tobacco use but did not quit completely.  4. Chronic venous insufficiency: Symptoms improved with support stockings.    Disposition:   FU with me in 3 months.  Signed,  [name], MD  11/29/2020 8:37 AM    [facility]

## 2020-11-29 ENCOUNTER — Encounter: Payer: Self-pay | Admitting: Cardiovascular Disease

## 2020-11-29 ENCOUNTER — Other Ambulatory Visit: Payer: Self-pay

## 2020-11-29 ENCOUNTER — Ambulatory Visit: Payer: No Typology Code available for payment source | Admitting: Cardiovascular Disease

## 2020-11-29 VITALS — BP 136/84 | HR 76 | Ht 73.0 in | Wt 229.0 lb

## 2020-11-29 DIAGNOSIS — Z720 Tobacco use: Secondary | ICD-10-CM

## 2020-11-29 DIAGNOSIS — I872 Venous insufficiency (chronic) (peripheral): Secondary | ICD-10-CM

## 2020-11-29 DIAGNOSIS — I10 Essential (primary) hypertension: Secondary | ICD-10-CM

## 2020-11-29 DIAGNOSIS — E785 Hyperlipidemia, unspecified: Secondary | ICD-10-CM

## 2020-11-29 DIAGNOSIS — I359 Nonrheumatic aortic valve disorder, unspecified: Secondary | ICD-10-CM

## 2020-11-29 NOTE — Patient Instructions (Signed)
Medication Instructions:  Your physician recommends that you continue on your current medications as directed. Please refer to the Current Medication list given to you today.  *If you need a refill on your cardiac medications before your next appointment, please call your pharmacy*   Lab Work: None ordered If you have labs (blood work) drawn today and your tests are completely normal, you will receive your results only by: . MyChart Message (if you have MyChart) OR . A paper copy in the mail If you have any lab test that is abnormal or we need to change your treatment, we will call you to review the results.   Testing/Procedures: None ordered   Follow-Up: At [facility], you and your health needs are our priority.  As part of our continuing mission to provide you with exceptional heart care, we have created designated Provider Care Teams.  These Care Teams include your primary Cardiologist (physician) and Advanced Practice Providers (APPs -  Physician Assistants and Nurse Practitioners) who all work together to provide you with the care you need, when you need it.  We recommend signing up for the patient portal called "MyChart".  Sign up information is provided on this After Visit Summary.  MyChart is used to connect with patients for Virtual Visits (Telemedicine).  Patients are able to view lab/test results, encounter notes, upcoming appointments, etc.  Non-urgent messages can be sent to your provider as well.   To learn more about what you can do with MyChart, go to [email or web address].    Your next appointment:   3 month(s)  The format for your next appointment:   In Person  Provider:   You may see Muhammad Arida, MD or one of the following Advanced Practice Providers on your designated Care Team:    Christopher Berge, NP  Ryan Dunn, PA-C  Jacquelyn Visser, PA-C  Cadence Furth, PA-C  Caitlin Walker, NP    Other Instructions N/A  

## 2020-12-18 ENCOUNTER — Other Ambulatory Visit: Payer: Self-pay

## 2020-12-18 MED ORDER — ATORVASTATIN CALCIUM 20 MG PO TABS: 20.0000 mg | ORAL_TABLET | Freq: Every day | ORAL | 0 refills | Status: DC

## 2020-12-24 ENCOUNTER — Other Ambulatory Visit: Payer: Self-pay

## 2020-12-24 MED ORDER — LISINOPRIL 40 MG PO TABS: 40.0000 mg | ORAL_TABLET | Freq: Every day | ORAL | 0 refills | Status: DC

## 2021-01-07 ENCOUNTER — Other Ambulatory Visit: Payer: Self-pay | Admitting: *Deleted

## 2021-01-07 MED ORDER — AMLODIPINE BESYLATE 10 MG PO TABS: 10.0000 mg | ORAL_TABLET | Freq: Every day | ORAL | 0 refills | Status: DC

## 2021-03-07 ENCOUNTER — Ambulatory Visit: Payer: No Typology Code available for payment source | Admitting: Cardiovascular Disease

## 2021-03-12 ENCOUNTER — Encounter: Payer: Self-pay | Admitting: Cardiovascular Disease

## 2021-03-12 ENCOUNTER — Ambulatory Visit: Payer: No Typology Code available for payment source | Admitting: Cardiovascular Disease

## 2021-03-12 ENCOUNTER — Other Ambulatory Visit: Payer: Self-pay

## 2021-03-12 VITALS — BP 138/70 | HR 86 | Ht 73.0 in | Wt 222.5 lb

## 2021-03-12 DIAGNOSIS — E785 Hyperlipidemia, unspecified: Secondary | ICD-10-CM | POA: Diagnosis not present

## 2021-03-12 DIAGNOSIS — I872 Venous insufficiency (chronic) (peripheral): Secondary | ICD-10-CM | POA: Diagnosis not present

## 2021-03-12 DIAGNOSIS — Z720 Tobacco use: Secondary | ICD-10-CM

## 2021-03-12 DIAGNOSIS — I10 Essential (primary) hypertension: Secondary | ICD-10-CM

## 2021-03-12 DIAGNOSIS — I359 Nonrheumatic aortic valve disorder, unspecified: Secondary | ICD-10-CM

## 2021-03-12 NOTE — Progress Notes (Signed)
Cardiology Office Note   Date:  03/12/2021   ID:  Chase [name], DOB 09/11/1956, MRN [id number]  PCP:  Birdie Sons, MD  Cardiologist:   [name], MD   Chief Complaint  Patient presents with  . other    3 month f/u no complaints today. Meds reviewed verbally with pt.      History of Present Illness: Chase [name] is a 65 y.o. male who presents for a follow-up visit regarding aortic stenosis, mildly dilated aortic root and essential hypertension.  He has known history of hypertension, tobacco use, psoriasis, obesity, hyperlipidemia and erectile dysfunction. He smokes 2 packs per day for 40 years. He also drinks 6-8 beers a day.  He had a treadmill stress test in August 2016 for atypical chest pain. The stress test was normal with hypertensive response to exercise. Most recent echocardiogram in July 2020 showed an EF of 55 to 60% with moderate aortic stenosis with mean gradient of 24 mmHg and valve area of 1.3 cm. There was also mildly dilated aortic root. He underwent subsequent CT angiogram of the chest that showed that the ascending aorta was only 3.9 cm.  The patient is suspected of having bicuspid aortic valve that but that has not been confirmed.  He has been doing well with no recent chest pain, shortness of breath or palpitations.  He continues to work at [employer].  Past Medical History:  Diagnosis Date  . Arthritis    "maybe" - hands  . Chest pain    a. ETT 06/14/2015: no st segment or T-waves changes during stress, mildly reduced exercise capacity, hypertensive response to exercise  . Chicken pox   . Chronic venous insufficiency    a. improved with support hose.  . Erectile dysfunction   . Essential hypertension    CONTROLLED ON MEDS  . History of echocardiogram    a. 03/2016 Echo: EF 55-60%, no rwma, mild AS, triv AI, mildly dil Ao.  Marland Kitchen HLD (hyperlipidemia)   . Measles   . Mumps   . Polysubstance abuse ([place])    a. ongoing tobacco and alcohol  abuse  . Wears dentures    full upper    Past Surgical History:  Procedure Laterality Date  . APPENDECTOMY  1972  . COLONOSCOPY WITH PROPOFOL N/A 08/27/2015   Procedure: COLONOSCOPY WITH PROPOFOL;  Surgeon: Lucilla Lame, MD;  Location: [place];  Service: Endoscopy;  Laterality: N/A;  . POLYPECTOMY  08/27/2015   Procedure: POLYPECTOMY;  Surgeon: Lucilla Lame, MD;  Location: [place];  Service: Endoscopy;;     Current Outpatient Medications  Medication Sig Dispense Refill  . amLODipine (NORVASC) 10 MG tablet Take 1 tablet (10 mg total) by mouth daily. 90 tablet 0  . aspirin 81 MG tablet Take 81 mg by mouth daily. AM    . atorvastatin (LIPITOR) 20 MG tablet Take 1 tablet (20 mg total) by mouth daily at 6 PM. 90 tablet 0  . CINNAMON PO Take by mouth. AM    . lisinopril (ZESTRIL) 40 MG tablet Take 1 tablet (40 mg total) by mouth daily. 90 tablet 0  . Multiple Vitamin (MULTIVITAMIN) capsule Take 1 capsule by mouth daily. AM    . Omega-3 Fatty Acids (FISH OIL) 1000 MG CAPS Take 1 capsule by mouth.     No current facility-administered medications for this visit.    Allergies:   Patient has no known allergies.    Social History:  The patient  reports that he has been smoking cigarettes. He started smoking about 50 years ago. He has a 90.00 pack-year smoking history. He has never used smokeless tobacco. He reports current alcohol use of about 30.0 standard drinks of alcohol per week. He reports that he does not use drugs.   Family History:  The patient's family history includes Heart disease in his mother; Pancreatic cancer in his father.    ROS:  Please see the history of present illness.   Otherwise, review of systems are positive for none.   All other systems are reviewed and negative.    PHYSICAL EXAM: VS:  BP 138/70 (BP Location: Left Arm, Patient Position: Sitting, Cuff Size: Normal)   Pulse 86   Ht 6\' 1"  (1.854 m)   Wt 222 lb 8 oz (100.9 kg)   SpO2 98%    BMI 29.36 kg/m  , BMI Body mass index is 29.36 kg/m. GEN: Well nourished, well developed, in no acute distress  HEENT: normal  Neck: no JVD, carotid bruits, or masses Cardiac: RRR; no  rubs, or gallops,no edema . 3/ 6 systolic ejection murmur in the aortic area which is mid peaking. Respiratory:  clear to auscultation bilaterally, normal work of breathing GI: soft, nontender, nondistended, + BS MS: no deformity or atrophy  Skin: warm and dry, no rash Neuro:  Strength and sensation are intact Psych: euthymic mood, full affect   EKG:  EKG is not ordered today.    Recent Labs: No results found for requested labs within last 8760 hours.    Lipid Panel    Component Value Date/Time   CHOL 139 06/07/2019 0818   TRIG 162 (H) 06/07/2019 0818   HDL 49 06/07/2019 0818   CHOLHDL 2.8 06/07/2019 0818   CHOLHDL 3.7 01/21/2017 0643   VLDL 46 (H) 01/21/2017 0643   LDLCALC 58 06/07/2019 0818      Wt Readings from Last 3 Encounters:  03/12/21 222 lb 8 oz (100.9 kg)  11/29/20 229 lb (103.9 kg)  12/01/19 233 lb 8 oz (105.9 kg)       No flowsheet data found.    ASSESSMENT AND PLAN:  1. Aortic valve disease with mildly dilated aortic root: I reviewed his most recent echocardiogram from 2020. I suspect that his aortic valve is bicuspid and not tricuspid. It was associated with moderate aortic stenosis at that time .  Recommend repeat echocardiogram in July.  I discussed with him that we need to proceed with a transesophageal echocardiogram at some point to evaluate for bicuspid aortic valve as that would have implications on treatment of associated ascending aortic aneurysm.  He wants to wait until he gets Medicare.  I think that is reasonable given that he has been stable.   2. Essential hypertension: blood pressure is controlled on current medications.  2. Hyperlipidemia: Significant improvement with atorvastatin with most recent LDL of 58.  3. Tobacco use: He cut down on tobacco  use but did not quit completely.  4. Chronic venous insufficiency: Symptoms improved with support stockings.    Disposition:   FU with me in 6 months.  Signed,  [name], MD  03/12/2021 4:04 PM    [facility]

## 2021-03-12 NOTE — Patient Instructions (Signed)
Medication Instructions:  Your physician recommends that you continue on your current medications as directed. Please refer to the Current Medication list given to you today.  *If you need a refill on your cardiac medications before your next appointment, please call your pharmacy*   Lab Work: None ordered If you have labs (blood work) drawn today and your tests are completely normal, you will receive your results only by: Marland Kitchen MyChart Message (if you have MyChart) OR . A paper copy in the mail If you have any lab test that is abnormal or we need to change your treatment, we will call you to review the results.   Testing/Procedures: Your physician has requested that you have an echocardiogram. Echocardiography is a painless test that uses sound waves to create images of your heart. It provides your doctor with information about the size and shape of your heart and how well your heart's chambers and valves are working. This procedure takes approximately one hour. There are no restrictions for this procedure. (To be scheduled in July 2022)   Follow-Up: At [facility], you and your health needs are our priority.  As part of our continuing mission to provide you with exceptional heart care, we have created designated Provider Care Teams.  These Care Teams include your primary Cardiologist (physician) and Advanced Practice Providers (APPs -  Physician Assistants and Nurse Practitioners) who all work together to provide you with the care you need, when you need it.  We recommend signing up for the patient portal called "MyChart".  Sign up information is provided on this After Visit Summary.  MyChart is used to connect with patients for Virtual Visits (Telemedicine).  Patients are able to view lab/test results, encounter notes, upcoming appointments, etc.  Non-urgent messages can be sent to your provider as well.   To learn more about what you can do with MyChart, go to [email or web address].     Your next appointment:   Your physician wants you to follow-up in: 6 months You will receive a reminder letter in the mail two months in advance. If you don't receive a letter, please call our office to schedule the follow-up appointment.   The format for your next appointment:   In Person  Provider:   You may see [name], MD or one of the following Advanced Practice Providers on your designated Care Team:    [name], NP  [name], PA-C  [name], PA-C  Cadence [place], [place]  Laurann Montana, NP    Other Instructions N/A

## 2021-03-19 ENCOUNTER — Other Ambulatory Visit: Payer: Self-pay

## 2021-03-19 MED ORDER — ATORVASTATIN CALCIUM 20 MG PO TABS: 20.0000 mg | ORAL_TABLET | Freq: Every day | ORAL | 1 refills | Status: DC

## 2021-03-26 ENCOUNTER — Other Ambulatory Visit: Payer: Self-pay

## 2021-03-26 MED ORDER — LISINOPRIL 40 MG PO TABS: 40.0000 mg | ORAL_TABLET | Freq: Every day | ORAL | 3 refills | Status: DC

## 2021-04-05 ENCOUNTER — Other Ambulatory Visit: Payer: Self-pay | Admitting: Cardiovascular Disease

## 2021-04-30 ENCOUNTER — Ambulatory Visit (INDEPENDENT_AMBULATORY_CARE_PROVIDER_SITE_OTHER): Payer: No Typology Code available for payment source

## 2021-04-30 ENCOUNTER — Other Ambulatory Visit: Payer: Self-pay

## 2021-04-30 DIAGNOSIS — I359 Nonrheumatic aortic valve disorder, unspecified: Secondary | ICD-10-CM

## 2021-04-30 LAB — ECHOCARDIOGRAM COMPLETE
AR max vel: 1.19 cm2
AV Area VTI: 1.17 cm2
AV Area mean vel: 1.1 cm2
AV Mean grad: 24.5 mmHg
AV Peak grad: 47.7 mmHg
Ao pk vel: 3.46 m/s
Area-P 1/2: 3.72 cm2
S' Lateral: 3.4 cm

## 2021-04-30 MED ORDER — PERFLUTREN LIPID MICROSPHERE
1.0000 mL | INTRAVENOUS | Status: AC | PRN
  Administered 2021-04-30: 2 mL via INTRAVENOUS

## 2021-05-02 ENCOUNTER — Other Ambulatory Visit: Payer: Self-pay

## 2021-05-02 DIAGNOSIS — I350 Nonrheumatic aortic (valve) stenosis: Secondary | ICD-10-CM

## 2021-09-22 ENCOUNTER — Other Ambulatory Visit: Payer: Self-pay | Admitting: Cardiovascular Disease

## 2021-09-23 NOTE — Telephone Encounter (Signed)
Please schedule 6 month F/U appointment. Thank you! 

## 2021-09-24 NOTE — Progress Notes (Signed)
Cardiology Office Note    Date:  09/27/2021   ID:  Chase [name], DOB [birth year], MRN [id number]  PCP:  Birdie Sons, MD  Cardiologist:  [name], MD  Electrophysiologist:  None   Chief Complaint: Follow-up  History of Present Illness:   Chase [name] is a 65 y.o. male with history of aortic stenosis suspected to be in the setting of bicuspid aortic valve but not confirmed, mildly dilated aortic root, HTN, HLD, ongoing tobacco and alcohol use, venous insufficiency, psoriasis, obesity, and ED who presents for follow-up of aortic stenosis.  Prior ETT in 05/2015 for atypical chest pain, showed no significant EKG changes concerning for ischemia with a hypertensive response to exercise.  He has been monitored periodically with echocardiograms for aortic stenosis with most recent echo from 04/2021 demonstrating an EF of 60 to 65%, no regional wall motion abnormalities, grade 1 diastolic dysfunction, normal RV systolic function and ventricular cavity size, severe calcification of the aortic valve with moderate stenosis with a mean gradient of 24.5 mmHg and valve area of 1.17 cm, borderline dilatation of the aortic root and ascending aorta measuring 38 mm and 39 mm respectively.  He underwent CTA of the chest/aorta for previously noted mildly dilated aortic root in 05/2019 which demonstrated the ascending aorta was only 3.9 cm.  He was last seen in the office in 02/2021 and was doing well from a cardiac perspective.  He comes in doing well from a cardiac perspective.  No symptoms concerning for angina, dyspnea, palpitations, dizziness, presyncope, or syncope.  He is tolerating all cardiac medications without issues.  He reports his blood pressure at home has been well controlled in the 120s to 130s over 70s.  He is currently working through the process of establishing his Medicare.  He does not have any cardiac issues or concerns at this time.   Labs independently reviewed: 05/2019 - albumin 4.5,  AST/ALT normal, TC 139, TG 162, HDL 49, LDL 58, TSH normal, potassium 4.8, BUN 12, serum creatinine 1.07  Past Medical History:  Diagnosis Date   Arthritis    "maybe" - hands   Chest pain    a. ETT 06/14/2015: no st segment or T-waves changes during stress, mildly reduced exercise capacity, hypertensive response to exercise   Chicken pox    Chronic venous insufficiency    a. improved with support hose.   Erectile dysfunction    Essential hypertension    CONTROLLED ON MEDS   History of echocardiogram    a. 03/2016 Echo: EF 55-60%, no rwma, mild AS, triv AI, mildly dil Ao.   HLD (hyperlipidemia)    Measles    Mumps    Polysubstance abuse ([place])    a. ongoing tobacco and alcohol abuse   Wears dentures    full upper    Past Surgical History:  Procedure Laterality Date   APPENDECTOMY  1972   COLONOSCOPY WITH PROPOFOL N/A 08/27/2015   Procedure: COLONOSCOPY WITH PROPOFOL;  Surgeon: Lucilla Lame, MD;  Location: [place];  Service: Endoscopy;  Laterality: N/A;   POLYPECTOMY  08/27/2015   Procedure: POLYPECTOMY;  Surgeon: Lucilla Lame, MD;  Location: [place];  Service: Endoscopy;;    Current Medications: Current Meds  Medication Sig   amLODipine (NORVASC) 10 MG tablet TAKE 1 TABLET(10 MG) BY MOUTH DAILY   aspirin 81 MG tablet Take 81 mg by mouth daily. AM   atorvastatin (LIPITOR) 20 MG tablet Take 1 tablet (20 mg total) by mouth daily at  6 PM.   CINNAMON PO Take by mouth. AM   lisinopril (ZESTRIL) 40 MG tablet Take 1 tablet (40 mg total) by mouth daily.   Multiple Vitamin (MULTIVITAMIN) capsule Take 1 capsule by mouth daily. AM   Omega-3 Fatty Acids (FISH OIL) 1000 MG CAPS Take 1 capsule by mouth.    Allergies:   Patient has no known allergies.   Social History   Socioeconomic History   Marital status: Married    Spouse name: Not on file   Number of children: Not on file   Years of education: Not on file   Highest education level: Not on file   Occupational History   Occupation: Full-time    Comment: Works in Chief Financial Officer at [employer] Use   Smoking status: Some Days    Packs/day: 2.00    Years: 45.00    Pack years: 90.00    Types: Cigarettes    Start date: 03/27/1970   Smokeless tobacco: Never   Tobacco comments:    smokes about 2 cigarettes weekly.  Substance and Sexual Activity   Alcohol use: Yes    Alcohol/week: 30.0 standard drinks    Types: 30 Cans of beer per week    Comment: drinks 4 beers daily   Drug use: No   Sexual activity: Not on file  Other Topics Concern   Not on file  Social History Narrative   Not on file   Social Determinants of Health   Financial Resource Strain: Not on file  Food Insecurity: Not on file  Transportation Needs: Not on file  Physical Activity: Not on file  Stress: Not on file  Social Connections: Not on file     Family History:  The patient's family history includes Heart disease in his mother; Pancreatic cancer in his father.  ROS:   Review of Systems  Constitutional:  Negative for chills, diaphoresis, fever and weight loss.  HENT:  Negative for congestion.   Eyes:  Negative for discharge and redness.  Respiratory:  Negative for cough, sputum production, shortness of breath and wheezing.   Cardiovascular:  Negative for chest pain, palpitations, orthopnea, claudication, leg swelling and PND.  Gastrointestinal:  Negative for abdominal pain, blood in stool, heartburn, melena, nausea and vomiting.  Musculoskeletal:  Negative for falls and myalgias.  Skin:  Negative for rash.  Neurological:  Negative for dizziness, tingling, tremors, sensory change, speech change, focal weakness, loss of consciousness and weakness.  Endo/Heme/Allergies:  Does not bruise/bleed easily.  Psychiatric/Behavioral:  Negative for substance abuse. The patient is not nervous/anxious.   All other systems reviewed and are negative.   EKGs/Labs/Other Studies Reviewed:    Studies reviewed  were summarized above. The additional studies were reviewed today:  2D echo 04/30/2021: 1. Left ventricular ejection fraction, by estimation, is 60 to 65%. The  left ventricle has normal function. The left ventricle has no regional  wall motion abnormalities. Left ventricular diastolic parameters are  consistent with Grade I diastolic  dysfunction (impaired relaxation).   2. Right ventricular systolic function is normal. The right ventricular  size is normal.   3. The mitral valve is normal in structure. No evidence of mitral valve  regurgitation. No evidence of mitral stenosis.   4. The aortic valve is normal in structure. There is severe calcifcation  of the aortic valve. Aortic valve regurgitation is not visualized.  Moderate aortic valve stenosis. Aortic valve area, by VTI measures 1.17  cm. Aortic valve mean gradient measures  24.5  mmHg. Aortic valve Vmax measures 3.46 m/s.   5. There is borderline dilatation of the ascending aorta, measuring 39  mm. There is borderline dilatation of the aortic root, measuring 38 mm. __________  2D echo 05/13/2019: 1. The left ventricle has normal systolic function, with an ejection  fraction of 55-60%. The cavity size was normal. There is mildly increased  left ventricular wall thickness of the septal wall. Left ventricular  diastolic Doppler parameters are  consistent with pseudonormalization.   2. The right ventricle has normal systolic function. The cavity was  normal. There is no increase in right ventricular wall thickness. Unable  to estimate RVSP.   3. Moderate stenosis of the aortic valve. Mean gradient of 24 mm Hg, peak  gradient of 44 mm Hg, estimated AVA 1.3 cmsq   4. There is dilatation of the aortic root 4.5 cm and of the ascending  aorta 4.2 cm. __________  2D echo 04/09/2016: - Left ventricle: The cavity size was normal. Wall thickness was    increased in a pattern of mild LVH. Systolic function was normal.    The estimated  ejection fraction was in the range of 55% to 60%.    Wall motion was normal; there were no regional wall motion    abnormalities.  - Aortic valve: Calcified annulus. Moderately thickened leaflets.    At the most, there was very mild stenosis. There was trivial    regurgitation. Valve area (VTI): 1.86 cm^2. Valve area (Vmax):    1.74 cm^2. Valve area (Vmean): 1.69 cm^2.  - Aorta: The aorta was mildly dilated. __________  ETT 06/14/2015: There was no ST segment deviation noted during stress. No T wave inversion was noted during stress.   Normal treadmill stress test. Mildly reduced exercise capacity with hypertensive response to exercise.    EKG:  EKG is ordered today.  The EKG ordered today demonstrates NSR, 74 bpm, no acute ST-T changes  Recent Labs: 09/27/2021: ALT 36; BUN 16; Creatinine, Ser 0.86; Potassium 4.5; Sodium 135  Recent Lipid Panel    Component Value Date/Time   CHOL 148 09/27/2021 0959   CHOL 139 06/07/2019 0818   TRIG 69 09/27/2021 0959   HDL 56 09/27/2021 0959   HDL 49 06/07/2019 0818   CHOLHDL 2.6 09/27/2021 0959   VLDL 14 09/27/2021 0959   LDLCALC 78 09/27/2021 0959   LDLCALC 58 06/07/2019 0818    PHYSICAL EXAM:    VS:  BP 140/86   Pulse 74   Ht 6\' 1"  (1.854 m)   Wt 216 lb 9.6 oz (98.2 kg)   SpO2 99%   BMI 28.58 kg/m   BMI: Body mass index is 28.58 kg/m.  Physical Exam Constitutional:      Appearance: He is well-developed.  HENT:     Head: Normocephalic and atraumatic.  Eyes:     General:        Right eye: No discharge.        Left eye: No discharge.  Neck:     Vascular: No JVD.  Cardiovascular:     Rate and Rhythm: Normal rate and regular rhythm.     Pulses:          Posterior tibial pulses are 2+ on the right side and 2+ on the left side.     Heart sounds: S1 normal and S2 normal. Heart sounds not distant. No midsystolic click and no opening snap. Murmur heard.  Harsh midsystolic murmur is present with a grade of 3/6 at  the upper right  sternal border radiating to the neck.    No friction rub.  Pulmonary:     Effort: Pulmonary effort is normal. No respiratory distress.     Breath sounds: Normal breath sounds. No decreased breath sounds, wheezing or rales.  Chest:     Chest wall: No tenderness.  Abdominal:     General: There is no distension.     Palpations: Abdomen is soft.     Tenderness: There is no abdominal tenderness.  Musculoskeletal:     Cervical back: Normal range of motion.     Right lower leg: No edema.     Left lower leg: No edema.  Skin:    General: Skin is warm and dry.     Nails: There is no clubbing.  Neurological:     Mental Status: He is alert and oriented to person, place, and time.  Psychiatric:        Speech: Speech normal.        Behavior: Behavior normal.        Thought Content: Thought content normal.        Judgment: Judgment normal.    Wt Readings from Last 3 Encounters:  09/27/21 216 lb 9.6 oz (98.2 kg)  03/12/21 222 lb 8 oz (100.9 kg)  11/29/20 229 lb (103.9 kg)     ASSESSMENT & PLAN:   Aortic valve stenosis with mildly dilated aortic root: Stable moderate aortic stenosis on echo dated 04/2021 with recommendation to follow-up echo in 12 months.  We will plan to pursue TEE to further evaluate his aortic valve, as this is suspected to be bicuspid, as well as to reimage his aortic root, once he has obtained Medicare.  Given stable transthoracic echo findings this is reasonable.  Precautions were discussed.  We will revisit the timing of repeat transthoracic echo based on findings of TEE when completed.  He will contact our office once he has Medicare established to schedule TEE with his primary cardiologist.  HTN: Blood pressure is mildly elevated in the office today, though well controlled at home.  He remains on lisinopril and amlodipine.  HLD: LDL 58 in 05/2019.  He remains on atorvastatin 20 mg.  Check CMP and lipid panel.  Tobacco use: Complete cessation is encouraged.  Chronic  venous insufficiency: Improved with compression stockings.   Disposition: F/u with Dr. Fletcher Anon or an APP in 6 months.   Medication Adjustments/Labs and Tests Ordered: Current medicines are reviewed at length with the patient today.  Concerns regarding medicines are outlined above. Medication changes, Labs and Tests ordered today are summarized above and listed in the Patient Instructions accessible in Encounters.   Signed, [name], PA-C 09/27/2021 1:16 PM     [facility] [address] [place] [address] [place], [place] [address] [phone]

## 2021-09-24 NOTE — Telephone Encounter (Signed)
LVM to schedule appt

## 2021-09-27 ENCOUNTER — Ambulatory Visit: Payer: No Typology Code available for payment source | Admitting: Physician Assistant

## 2021-09-27 ENCOUNTER — Other Ambulatory Visit
Admission: RE | Admit: 2021-09-27 | Discharge: 2021-09-27 | Disposition: A | Discharge status: Home / Self Care | Payer: No Typology Code available for payment source | Source: Ambulatory Visit | Attending: Physician Assistant | Admitting: Physician Assistant

## 2021-09-27 ENCOUNTER — Telehealth: Payer: Self-pay | Admitting: *Deleted

## 2021-09-27 ENCOUNTER — Encounter: Payer: Self-pay | Admitting: Physician Assistant

## 2021-09-27 ENCOUNTER — Other Ambulatory Visit: Payer: Self-pay

## 2021-09-27 VITALS — BP 140/86 | HR 74 | Ht 73.0 in | Wt 216.6 lb

## 2021-09-27 DIAGNOSIS — I872 Venous insufficiency (chronic) (peripheral): Secondary | ICD-10-CM

## 2021-09-27 DIAGNOSIS — E785 Hyperlipidemia, unspecified: Secondary | ICD-10-CM

## 2021-09-27 DIAGNOSIS — I10 Essential (primary) hypertension: Secondary | ICD-10-CM

## 2021-09-27 DIAGNOSIS — I77810 Thoracic aortic ectasia: Secondary | ICD-10-CM

## 2021-09-27 DIAGNOSIS — I350 Nonrheumatic aortic (valve) stenosis: Secondary | ICD-10-CM | POA: Diagnosis not present

## 2021-09-27 LAB — LIPID PANEL
Cholesterol: 148 mg/dL (ref 0–200)
HDL: 56 mg/dL (ref >40)
LDL Cholesterol: 78 mg/dL (ref 0–99)
Total CHOL/HDL Ratio: 2.6 RATIO
Triglycerides: 69 mg/dL (ref <150)
VLDL: 14 mg/dL (ref 0–40)

## 2021-09-27 LAB — COMPREHENSIVE METABOLIC PANEL
ALT: 36 U/L (ref 0–44)
AST: 23 U/L (ref 15–41)
Albumin: 4.6 g/dL (ref 3.5–5.0)
Alkaline Phosphatase: 47 U/L (ref 38–126)
Anion gap: 7 (ref 5–15)
BUN: 16 mg/dL (ref 8–23)
CO2: 26 mmol/L (ref 22–32)
Calcium: 8.9 mg/dL (ref 8.9–10.3)
Chloride: 102 mmol/L (ref 98–111)
Creatinine, Ser: 0.86 mg/dL (ref 0.61–1.24)
GFR, Estimated: >60 mL/min (ref >60)
Glucose, Bld: 116 mg/dL — ABNORMAL HIGH (ref 70–99)
Potassium: 4.5 mmol/L (ref 3.5–5.1)
Sodium: 135 mmol/L (ref 135–145)
Total Bilirubin: 1.3 mg/dL — ABNORMAL HIGH (ref 0.3–1.2)
Total Protein: 7.2 g/dL (ref 6.5–8.1)

## 2021-09-27 NOTE — Telephone Encounter (Signed)
Reviewed results and recommendations with patient. He verbalized understanding and prefers to make diet changes and exercise in an effort to reduce those numbers. He is not inclined to try increased dose at this time. Advised I would make provider aware of his choice. He verbalized understanding of our conversation, agreement with plan, and had no further questions at this time.

## 2021-09-27 NOTE — Patient Instructions (Addendum)
Medication Instructions:  - Your physician recommends that you continue on your current medications as directed. Please refer to the Current Medication list given to you today.  *If you need a refill on your cardiac medications before your next appointment, please call your pharmacy*   Lab Work: - Your physician recommends that you have lab work today: CMET/ Lipid  Nature conservation officer at [facility] 1st desk on the right to check in (REGISTRATION) Lab hours: Monday- Friday (7:30 am- 5:30 pm)   If you have labs (blood work) drawn today and your tests are completely normal, you will receive your results only by: MyChart Message (if you have MyChart) OR A paper copy in the mail If you have any lab test that is abnormal or we need to change your treatment, we will call you to review the results.   Testing/Procedures: - none ordered   Follow-Up: At [facility], you and your health needs are our priority.  As part of our continuing mission to provide you with exceptional heart care, we have created designated Provider Care Teams.  These Care Teams include your primary Cardiologist (physician) and Advanced Practice Providers (APPs -  Physician Assistants and Nurse Practitioners) who all work together to provide you with the care you need, when you need it.  We recommend signing up for the patient portal called "MyChart".  Sign up information is provided on this After Visit Summary.  MyChart is used to connect with patients for Virtual Visits (Telemedicine).  Patients are able to view lab/test results, encounter notes, upcoming appointments, etc.  Non-urgent messages can be sent to your provider as well.   To learn more about what you can do with MyChart, go to [email or web address].    Your next appointment:   6 month(s)  The format for your next appointment:   In Person  Provider:   You may see [name], MD or one of the following Advanced Practice Providers on your designated  Care Team:    [name], PA-C     Other Instructions  1) call the office when you Medicare is set up and we will schedule you for a TEE (transesophageal echocardiogram) to be done.   Transesophageal Echocardiogram Transesophageal echocardiogram (TEE) is a test that uses sound waves to take pictures of your heart. TEE is done by passing a small probe attached to a flexible tube down the part of the body that moves food from your mouth to your stomach (esophagus). The pictures give clear images of your heart. This can help your doctor see if there are problems with your heart. Tell a doctor about: Any allergies you have. All medicines you are taking. This includes vitamins, herbs, eye drops, creams, and over-the-counter medicines. Any problems you or family members have had with anesthetic medicines. Any blood disorders you have. Any surgeries you have had. Any medical conditions you have. Any swallowing problems. Whether you have or have had a blockage in the part of the body that moves food from your mouth to your stomach. Whether you are pregnant or may be pregnant. What are the risks? In general, this is a safe procedure. But, problems may occur, such as: Damage to nearby structures or organs. A tear in the part of the body that moves food from your mouth to your stomach. Irregular heartbeat. Hoarse voice or trouble swallowing. Bleeding. What happens before the procedure? Medicines Ask your doctor about changing or stopping: Your normal medicines. Vitamins, herbs, and supplements. Over-the-counter medicines. Do  not take aspirin or ibuprofen unless you are told to. General instructions Follow instructions from your doctor about what you cannot eat or drink. You will take out any dentures or dental retainers. Plan to have a responsible adult take you home from the hospital or clinic. Plan to have a responsible adult care for you for the time you are told after you leave the  hospital or clinic. This is important. What happens during the procedure?  An IV will be put into one of your veins. You may be given: A sedative. This medicine helps you relax. A medicine to numb the back of your throat. This may be sprayed or gargled. Your blood pressure, heart rate, and breathing will be watched. You may be asked to lie on your left side. A bite block will be placed in your mouth. This keeps you from biting the tube. The tip of the probe will be placed into the back of your mouth. You will be asked to swallow. Your doctor will take pictures of your heart. The probe and bite block will be taken out after the test is done. The procedure may vary among doctors and hospitals. What can I expect after the procedure? You will be monitored until you leave the hospital or clinic. This includes checking your blood pressure, heart rate, breathing rate, and blood oxygen level. Your throat may feel sore and numb. This will get better over time. You will not be allowed to eat or drink until the numbness has gone away. It is common to have a sore throat for a day or two. It is up to you to get the results of your procedure. Ask how to get your results when they are ready. Follow these instructions at home: If you were given a sedative during your procedure, do not drive or use machines until your doctor says that it is safe. Return to your normal activities when your doctor says that it is safe. Keep all follow-up visits. Summary TEE is a test that uses sound waves to take pictures of your heart. You will be given a medicine to help you relax. Do not drive or use machines until your doctor says that it is safe. This information is not intended to replace advice given to you by your health care provider. Make sure you discuss any questions you have with your health care provider. Document Revised: 06/19/2021 Document Reviewed: 05/29/2020 Elsevier Patient Education  2022 [employer].

## 2021-09-27 NOTE — Telephone Encounter (Signed)
-----   Message from Rise Mu, PA-C sent at 09/27/2021  1:06 PM EST ----- No, they were drawn after his visit in the medical mall. ----- Message ----- From: Valora Corporal, RN Sent: 09/27/2021   1:03 PM EST To: Rise Mu, PA-C  Did you review these labs at his visit today?   ----- Message ----- From: Sindy Messing Sent: 09/27/2021  11:10 AM EST To: Valora Corporal, RN  Please inform patient his triglycerides are improved and now normal.  LDL is a little higher than prior check at 78 with goal being less than 70.  Random glucose okay.  Potassium at goal.  Kidney and liver function normal.  Recommendations: -Previously, his LDL was well controlled on atorvastatin 20 mg daily.  We have 2 options, he can work on heart healthy diet and regular exercise in an effort to reduce his LDL less than 70, or we can titrate atorvastatin to 40 mg daily.

## 2021-10-01 MED ORDER — ATORVASTATIN CALCIUM 40 MG PO TABS: 40.0000 mg | ORAL_TABLET | Freq: Every day | ORAL | 3 refills | Status: DC

## 2021-10-01 NOTE — Telephone Encounter (Signed)
Patient calling to confirm refill of atorvastatin at 20 mg po q d .   He is aware last note sent to provider for review and orders and refill is pending at this time.   Patient understands above and will await refill.

## 2021-10-01 NOTE — Telephone Encounter (Signed)
Spoke with patient and reviewed previous discussion on medication increase. He stated he misunderstood at that time. He would like to proceed with increased dose of atorvastatin. Confirmed pharmacy and med dose change sent. He was very appreciative for the call back with no further questions at this time.

## 2021-10-01 NOTE — Addendum Note (Signed)
Addended by: Valora Corporal on: 10/01/2021 09:06 AM   Modules accepted: Orders

## 2021-10-08 ENCOUNTER — Other Ambulatory Visit: Payer: Self-pay | Admitting: Cardiovascular Disease

## 2021-10-25 ENCOUNTER — Other Ambulatory Visit: Payer: Self-pay

## 2021-10-25 ENCOUNTER — Other Ambulatory Visit: Payer: Self-pay | Admitting: Physical Medicine & Rehabilitation

## 2021-10-25 ENCOUNTER — Ambulatory Visit
Admission: RE | Admit: 2021-10-25 | Discharge: 2021-10-25 | Disposition: A | Discharge status: Home / Self Care | Payer: No Typology Code available for payment source | Source: Ambulatory Visit | Attending: Physical Medicine & Rehabilitation | Admitting: Physical Medicine & Rehabilitation

## 2021-10-25 DIAGNOSIS — M5441 Lumbago with sciatica, right side: Secondary | ICD-10-CM

## 2021-10-25 DIAGNOSIS — R29898 Other symptoms and signs involving the musculoskeletal system: Secondary | ICD-10-CM

## 2021-10-31 ENCOUNTER — Telehealth: Payer: Self-pay

## 2021-10-31 DIAGNOSIS — I714 Abdominal aortic aneurysm, without rupture, unspecified: Secondary | ICD-10-CM

## 2021-10-31 NOTE — Telephone Encounter (Signed)
-----   Message from Wellington Hampshire, MD sent at 10/31/2021 12:01 PM EST ----- We received the report of MRI which showed incidental finding small abdominal aortic aneurysm.  Please inform the patient that this is known from prior imaging and this is not a new finding.  Recommend a follow-up AAA scan in our office in 6 months to ensure stability of the size.   ----- Message ----- From: Valora Corporal, RN Sent: 10/28/2021   3:47 PM EST To: Wellington Hampshire, MD, Lamar Laundry, RN   ----- Message ----- From: Ace Gins Sent: 10/28/2021   3:16 PM EST To: Valora Corporal, RN

## 2021-10-31 NOTE — Telephone Encounter (Signed)
Patient made aware of Dr. Tyrell Antonio response and recommendation below. Order placed for 6 mo AAA duplex. Adv the patient that our office will call him to schedule.  Patient verbalized understanding and voiced appreciation for the call.

## 2021-11-07 ENCOUNTER — Telehealth: Payer: Self-pay | Admitting: Family Medicine

## 2021-11-07 DIAGNOSIS — E041 Nontoxic single thyroid nodule: Secondary | ICD-10-CM

## 2021-11-07 NOTE — Telephone Encounter (Signed)
Please advise patient it is time to check thyroid ultrasound. Have placed order, he should get a call in the next several days to schedule

## 2021-11-08 NOTE — Telephone Encounter (Signed)
Pt advised.   Thanks,   -Joanie Duprey  

## 2021-11-22 ENCOUNTER — Ambulatory Visit
Admission: RE | Admit: 2021-11-22 | Discharge: 2021-11-22 | Disposition: A | Discharge status: Home / Self Care | Payer: No Typology Code available for payment source | Source: Ambulatory Visit | Attending: Family Medicine | Admitting: Family Medicine

## 2021-11-22 ENCOUNTER — Other Ambulatory Visit: Payer: Self-pay

## 2021-11-22 DIAGNOSIS — E041 Nontoxic single thyroid nodule: Secondary | ICD-10-CM | POA: Diagnosis present

## 2022-04-01 ENCOUNTER — Other Ambulatory Visit: Payer: Self-pay

## 2022-04-01 MED ORDER — LISINOPRIL 40 MG PO TABS: 40.0000 mg | ORAL_TABLET | Freq: Every day | ORAL | 0 refills | Status: DC

## 2022-04-02 ENCOUNTER — Telehealth: Payer: Self-pay | Admitting: Cardiovascular Disease

## 2022-04-02 NOTE — Telephone Encounter (Signed)
Patient states he will call back

## 2022-04-02 NOTE — Telephone Encounter (Signed)
-----   Message from Horton Finer sent at 04/01/2022  1:44 PM EDT ----- Regarding: needs appt Please schedule overdue 6 month F/U for 90 day refills. Thank you!

## 2022-04-15 ENCOUNTER — Other Ambulatory Visit: Payer: Self-pay | Admitting: Cardiovascular Disease

## 2022-05-01 ENCOUNTER — Other Ambulatory Visit: Payer: Self-pay | Admitting: Physician Assistant

## 2022-05-05 NOTE — Progress Notes (Unsigned)
Cardiology Office Note    Date:  05/06/2022   ID:  Chase [name], DOB Sep 14, 1956, MRN [id number]  PCP:  Birdie Sons, MD  Cardiologist:  [name], MD  Electrophysiologist:  None   Chief Complaint: Follow-up  History of Present Illness:   Chase [name] is a 66 y.o. male with history of aortic stenosis suspected to be in the setting of bicuspid aortic valve but not confirmed, mildly dilated aortic root, HTN, HLD, ongoing tobacco and alcohol use, venous insufficiency, psoriasis, obesity, and ED who presents for follow-up of aortic stenosis.   Prior ETT in 05/2015 for atypical chest pain, showed no significant EKG changes concerning for ischemia with a hypertensive response to exercise.  He has been monitored periodically with echocardiograms for aortic stenosis with most recent echo from 04/2021 demonstrating an EF of 60 to 65%, no regional wall motion abnormalities, grade 1 diastolic dysfunction, normal RV systolic function and ventricular cavity size, severe calcification of the aortic valve with moderate stenosis with a mean gradient of 24.5 mmHg and valve area of 1.17 cm, borderline dilatation of the aortic root and ascending aorta measuring 38 mm and 39 mm respectively.  He underwent CTA of the chest/aorta for previously noted mildly dilated aortic root in 05/2019 which demonstrated the ascending aorta was 3.9 cm.   He was last seen in the office in 09/2021 and was without symptoms of angina or decompensation.  He was working through acquiring Medicare with plans to pursue TEE once this was completed.  Since we last saw him, he underwent MRI of the lumbar spine which demonstrated an abdominal aorta measuring up to 3.3 cm, though was suboptimally evaluated.  With this, abdominal ultrasound was recommended and remains pending at this time.  He comes in doing well from a cardiac perspective, and is without symptoms of angina or decompensation.  No dyspnea, palpitations, dizziness, presyncope,  or syncope.  No significant lower extremity swelling or orthopnea.  He is tolerating cardiac medications without issues.  If possible, he would like to defer cardiac imaging, when able, to minimize financial constraints.  Otherwise, he does not have any issues or concerns at this time.   Labs independently reviewed: 09/2021 -potassium 4.5, BUN 16, serum creatinine 0.86, albumin 4.6, AST/ALT normal, TC 148, TG 69, HDL 56, LDL 78 05/2019 - TSH normal  Past Medical History:  Diagnosis Date   Arthritis    "maybe" - hands   Chest pain    a. ETT 06/14/2015: no st segment or T-waves changes during stress, mildly reduced exercise capacity, hypertensive response to exercise   Chicken pox    Chronic venous insufficiency    a. improved with support hose.   Erectile dysfunction    Essential hypertension    CONTROLLED ON MEDS   History of echocardiogram    a. 03/2016 Echo: EF 55-60%, no rwma, mild AS, triv AI, mildly dil Ao.   HLD (hyperlipidemia)    Measles    Mumps    Polysubstance abuse ([place])    a. ongoing tobacco and alcohol abuse   Wears dentures    full upper    Past Surgical History:  Procedure Laterality Date   APPENDECTOMY  1972   COLONOSCOPY WITH PROPOFOL N/A 08/27/2015   Procedure: COLONOSCOPY WITH PROPOFOL;  Surgeon: Lucilla Lame, MD;  Location: [facility];  Service: Endoscopy;  Laterality: N/A;   POLYPECTOMY  08/27/2015   Procedure: POLYPECTOMY;  Surgeon: Lucilla Lame, MD;  Location: [facility];  Service: Endoscopy;;  Current Medications: Current Meds  Medication Sig   amLODipine (NORVASC) 10 MG tablet TAKE 1 TABLET(10 MG) BY MOUTH DAILY   aspirin 81 MG tablet Take 81 mg by mouth daily. AM   atorvastatin (LIPITOR) 40 MG tablet Take 1 tablet (40 mg total) by mouth daily.   carvedilol (COREG) 3.125 MG tablet Take 1 tablet (3.125 mg total) by mouth 2 (two) times daily.   CINNAMON PO Take by mouth. AM   gabapentin (NEURONTIN) 100 MG capsule Take 100 mg by mouth  2 (two) times daily.   gabapentin (NEURONTIN) 300 MG capsule Take 300 mg by mouth at bedtime.   lisinopril (ZESTRIL) 40 MG tablet TAKE 1 TABLET(40 MG) BY MOUTH DAILY. SCHEDULE OFFICE VISIT   Multiple Vitamin (MULTIVITAMIN) capsule Take 1 capsule by mouth daily. AM   Omega-3 Fatty Acids (FISH OIL) 1000 MG CAPS Take 1 capsule by mouth.    Allergies:   Patient has no known allergies.   Social History   Socioeconomic History   Marital status: Married    Spouse name: Not on file   Number of children: Not on file   Years of education: Not on file   Highest education level: Not on file  Occupational History   Occupation: Full-time    Comment: Works in Chief Financial Officer at [employer]   Smoking status: Some Days    Packs/day: 2.00    Years: 45.00    Total pack years: 90.00    Types: Cigarettes    Start date: 03/27/1970   Smokeless tobacco: Never   Tobacco comments:    smokes about 2 cigarettes weekly.  Substance and Sexual Activity   Alcohol use: Yes    Alcohol/week: 30.0 standard drinks of alcohol    Types: 30 Cans of beer per week    Comment: drinks 4 beers daily   Drug use: No   Sexual activity: Not on file  Other Topics Concern   Not on file  Social History Narrative   Not on file   Social Determinants of Health   Financial Resource Strain: Not on file  Food Insecurity: Not on file  Transportation Needs: Not on file  Physical Activity: Not on file  Stress: Not on file  Social Connections: Not on file     Family History:  The patient's family history includes Heart disease in his mother; Pancreatic cancer in his father.  ROS:   12-point review of systems is negative unless otherwise noted in the HPI.   EKGs/Labs/Other Studies Reviewed:    Studies reviewed were summarized above. The additional studies were reviewed today:  2D echo 04/30/2021: 1. Left ventricular ejection fraction, by estimation, is 60 to 65%. The  left ventricle has normal function.  The left ventricle has no regional  wall motion abnormalities. Left ventricular diastolic parameters are  consistent with Grade I diastolic  dysfunction (impaired relaxation).   2. Right ventricular systolic function is normal. The right ventricular  size is normal.   3. The mitral valve is normal in structure. No evidence of mitral valve  regurgitation. No evidence of mitral stenosis.   4. The aortic valve is normal in structure. There is severe calcifcation  of the aortic valve. Aortic valve regurgitation is not visualized.  Moderate aortic valve stenosis. Aortic valve area, by VTI measures 1.17  cm. Aortic valve mean gradient measures  24.5 mmHg. Aortic valve Vmax measures 3.46 m/s.   5. There is borderline dilatation of the ascending aorta, measuring 39  mm. There is borderline dilatation of the aortic root, measuring 38 mm. __________   2D echo 05/13/2019: 1. The left ventricle has normal systolic function, with an ejection  fraction of 55-60%. The cavity size was normal. There is mildly increased  left ventricular wall thickness of the septal wall. Left ventricular  diastolic Doppler parameters are  consistent with pseudonormalization.   2. The right ventricle has normal systolic function. The cavity was  normal. There is no increase in right ventricular wall thickness. Unable  to estimate RVSP.   3. Moderate stenosis of the aortic valve. Mean gradient of 24 mm Hg, peak  gradient of 44 mm Hg, estimated AVA 1.3 cmsq   4. There is dilatation of the aortic root 4.5 cm and of the ascending  aorta 4.2 cm. __________   2D echo 04/09/2016: - Left ventricle: The cavity size was normal. Wall thickness was    increased in a pattern of mild LVH. Systolic function was normal.    The estimated ejection fraction was in the range of 55% to 60%.    Wall motion was normal; there were no regional wall motion    abnormalities.  - Aortic valve: Calcified annulus. Moderately thickened leaflets.     At the most, there was very mild stenosis. There was trivial    regurgitation. Valve area (VTI): 1.86 cm^2. Valve area (Vmax):    1.74 cm^2. Valve area (Vmean): 1.69 cm^2.  - Aorta: The aorta was mildly dilated. __________   ETT 06/14/2015: There was no ST segment deviation noted during stress. No T wave inversion was noted during stress.   Normal treadmill stress test. Mildly reduced exercise capacity with hypertensive response to exercise.    EKG:  EKG is ordered today.  The EKG ordered today demonstrates NSR, 86 bpm, no acute ST-T changes  Recent Labs: 09/27/2021: ALT 36; BUN 16; Creatinine, Ser 0.86; Potassium 4.5; Sodium 135  Recent Lipid Panel    Component Value Date/Time   CHOL 148 09/27/2021 0959   CHOL 139 06/07/2019 0818   TRIG 69 09/27/2021 0959   HDL 56 09/27/2021 0959   HDL 49 06/07/2019 0818   CHOLHDL 2.6 09/27/2021 0959   VLDL 14 09/27/2021 0959   LDLCALC 78 09/27/2021 0959   LDLCALC 58 06/07/2019 0818    PHYSICAL EXAM:    VS:  BP 140/70 (BP Location: Left Arm, Patient Position: Sitting, Cuff Size: Normal)   Pulse 86   Ht '6\' 1"'$  (1.854 m)   Wt 223 lb 2 oz (101.2 kg)   SpO2 98%   BMI 29.44 kg/m   BMI: Body mass index is 29.44 kg/m.  Physical Exam Vitals reviewed.  Constitutional:      Appearance: He is well-developed.  HENT:     Head: Normocephalic and atraumatic.  Eyes:     General:        Right eye: No discharge.        Left eye: No discharge.  Neck:     Vascular: No JVD.  Cardiovascular:     Rate and Rhythm: Normal rate and regular rhythm.     Pulses:          Posterior tibial pulses are 2+ on the right side and 2+ on the left side.     Heart sounds: S1 normal and S2 normal. Heart sounds not distant. No midsystolic click and no opening snap. Murmur heard.     Harsh midsystolic murmur is present with a grade of 3/6 at the upper  right sternal border radiating to the neck.     No friction rub.  Pulmonary:     Effort: Pulmonary effort is  normal. No respiratory distress.     Breath sounds: Normal breath sounds. No decreased breath sounds, wheezing or rales.  Chest:     Chest wall: No tenderness.  Abdominal:     General: There is no distension.     Palpations: Abdomen is soft.     Tenderness: There is no abdominal tenderness.  Musculoskeletal:     Cervical back: Normal range of motion.     Right lower leg: No edema.     Left lower leg: No edema.  Skin:    General: Skin is warm and dry.     Nails: There is no clubbing.  Neurological:     Mental Status: He is alert and oriented to person, place, and time.  Psychiatric:        Speech: Speech normal.        Behavior: Behavior normal.        Thought Content: Thought content normal.        Judgment: Judgment normal.     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  There is concern for possible bicuspid aortic valve which would change recommendations regarding potential repair of his dilated aortic root down the road.  He prefers to avoid TEE at this time given financial constraints.  He is agreeable to pursuing a 2D surface echo to evaluate for stability.  He will need a TEE to further evaluate his aortic valve.  Abdominal aortic aneurysm: Abdominal aorta noted to be 3.3 cm on MRI of the lumbar spine, though was incompletely imaged.  This was stable when compared to imaging from 03/2018.  Schedule AAA ultrasound.  Recommend complete smoking cessation and optimal blood pressure control.  HTN: Blood pressure is mildly elevated in the office today.  Add carvedilol 3.125 mg twice daily.  He will otherwise continue amlodipine 10 mg and lisinopril 40 mg.  HLD: LDL 78.  He remains on atorvastatin 40 mg.  Tobacco use: Complete cessation is  encouraged.   Disposition: F/u with Dr. Fletcher Anon or an APP in 6 months.   Medication Adjustments/Labs and Tests Ordered: Current medicines are reviewed at length with the patient today.  Concerns regarding medicines are outlined above. Medication changes, Labs and Tests ordered today are summarized above and listed in the Patient Instructions accessible in Encounters.   Signed, [name], PA-C 05/06/2022 9:58 AM     [facility] [address] [place] [address] [place], [place] [address] [phone]

## 2022-05-06 ENCOUNTER — Ambulatory Visit: Payer: No Typology Code available for payment source | Admitting: Physician Assistant

## 2022-05-06 ENCOUNTER — Encounter: Payer: Self-pay | Admitting: Physician Assistant

## 2022-05-06 VITALS — BP 140/70 | HR 86 | Ht 73.0 in | Wt 223.1 lb

## 2022-05-06 DIAGNOSIS — Z720 Tobacco use: Secondary | ICD-10-CM

## 2022-05-06 DIAGNOSIS — I77810 Thoracic aortic ectasia: Secondary | ICD-10-CM

## 2022-05-06 DIAGNOSIS — I350 Nonrheumatic aortic (valve) stenosis: Secondary | ICD-10-CM

## 2022-05-06 DIAGNOSIS — Q231 Congenital insufficiency of aortic valve: Secondary | ICD-10-CM

## 2022-05-06 DIAGNOSIS — E785 Hyperlipidemia, unspecified: Secondary | ICD-10-CM

## 2022-05-06 DIAGNOSIS — I714 Abdominal aortic aneurysm, without rupture, unspecified: Secondary | ICD-10-CM

## 2022-05-06 DIAGNOSIS — I10 Essential (primary) hypertension: Secondary | ICD-10-CM

## 2022-05-06 MED ORDER — CARVEDILOL 3.125 MG PO TABS: 3.1250 mg | ORAL_TABLET | Freq: Two times a day (BID) | ORAL | 3 refills | Status: DC

## 2022-05-06 NOTE — Patient Instructions (Signed)
Medication Instructions:  Your physician has recommended you make the following change in your medication:   START Carvedilol (Coreg) 3.125 mg twice a day  *If you need a refill on your cardiac medications before your next appointment, please call your pharmacy*   Lab Work: None  If you have labs (blood work) drawn today and your tests are completely normal, you will receive your results only by: [place] (if you have MyChart) OR A paper copy in the mail If you have any lab test that is abnormal or we need to change your treatment, we will call you to review the results.   Testing/Procedures: Please schedule his AAA [place] to be done.   Your physician has requested that you have an echocardiogram. Echocardiography is a painless test that uses sound waves to create images of your heart. It provides your doctor with information about the size and shape of your heart and how well your heart's chambers and valves are working. This procedure takes approximately one hour. There are no restrictions for this procedure.   Follow-Up: At [facility], you and your health needs are our priority.  As part of our continuing mission to provide you with exceptional heart care, we have created designated Provider Care Teams.  These Care Teams include your primary Cardiologist (physician) and Advanced Practice Providers (APPs -  Physician Assistants and Nurse Practitioners) who all work together to provide you with the care you need, when you need it.  We recommend signing up for the patient portal called "MyChart".  Sign up information is provided on this After Visit Summary.  MyChart is used to connect with patients for Virtual Visits (Telemedicine).  Patients are able to view lab/test results, encounter notes, upcoming appointments, etc.  Non-urgent messages can be sent to your provider as well.   To learn more about what you can do with MyChart, go to [email or web address].    Your next  appointment:   6 month(s)  The format for your next appointment:   In Person  Provider:   [name], MD or [name], PA-C       Important Information About Sugar

## 2022-05-13 ENCOUNTER — Other Ambulatory Visit: Payer: Self-pay | Admitting: Cardiovascular Disease

## 2022-05-29 ENCOUNTER — Other Ambulatory Visit: Payer: Self-pay | Admitting: Physician Assistant

## 2022-06-05 ENCOUNTER — Ambulatory Visit (INDEPENDENT_AMBULATORY_CARE_PROVIDER_SITE_OTHER): Payer: No Typology Code available for payment source

## 2022-06-05 DIAGNOSIS — I714 Abdominal aortic aneurysm, without rupture, unspecified: Secondary | ICD-10-CM

## 2022-06-05 DIAGNOSIS — Q231 Congenital insufficiency of aortic valve: Secondary | ICD-10-CM

## 2022-06-05 DIAGNOSIS — I7141 Pararenal abdominal aortic aneurysm, without rupture: Secondary | ICD-10-CM | POA: Diagnosis not present

## 2022-06-05 LAB — ECHOCARDIOGRAM COMPLETE
AR max vel: 0.98 cm2
AV Area VTI: 1.13 cm2
AV Area mean vel: 1.08 cm2
AV Mean grad: 23 mmHg
AV Peak grad: 50.7 mmHg
Ao pk vel: 3.56 m/s
Area-P 1/2: 4.86 cm2
Calc EF: 55.6 %
S' Lateral: 2.8 cm
Single Plane A2C EF: 53.9 %
Single Plane A4C EF: 55.2 %

## 2022-06-11 ENCOUNTER — Other Ambulatory Visit: Payer: Self-pay

## 2022-06-11 DIAGNOSIS — I714 Abdominal aortic aneurysm, without rupture, unspecified: Secondary | ICD-10-CM

## 2022-10-02 ENCOUNTER — Other Ambulatory Visit: Payer: Self-pay | Admitting: Physician Assistant

## 2022-10-02 NOTE — Telephone Encounter (Signed)
Lisinopril 40 mg # 30 x 3 refills Atorvastatin 40 mg # 90 x 1 refill Sent to   [id number] - [place], [place] AT [place]

## 2022-10-16 ENCOUNTER — Other Ambulatory Visit: Payer: Self-pay | Admitting: Cardiovascular Disease

## 2022-10-16 NOTE — Telephone Encounter (Signed)
LVM to schedule appt

## 2022-10-16 NOTE — Telephone Encounter (Signed)
Please schedule 6 month F/U appointment. Thank you! 

## 2022-11-03 NOTE — Progress Notes (Unsigned)
Cardiology Office Note    Date:  11/04/2022   ID:  Chase [name], DOB 1955-11-11, MRN [id number]  PCP:  Birdie Sons, MD  Cardiologist:  [name], MD  Electrophysiologist:  None   Chief Complaint: Follow-up  History of Present Illness:   Chase [name] is a 67 y.o. male with history of aortic stenosis suspected to be in the setting of bicuspid aortic valve but not confirmed, mildly dilated aortic root, HTN, HLD, ongoing tobacco and alcohol use, venous insufficiency, psoriasis, obesity, and ED who presents for follow-up of aortic stenosis.   Prior ETT in 05/2015, for atypical chest pain, showed no significant EKG changes concerning for ischemia with a hypertensive response to exercise.  He has been monitored periodically with echocardiograms for aortic stenosis with echo from 04/2021 demonstrating an EF of 60 to 65%, no regional wall motion abnormalities, grade 1 diastolic dysfunction, normal RV systolic function and ventricular cavity size, severe calcification of the aortic valve with moderate stenosis with a mean gradient of 24.5 mmHg and valve area of 1.17 cm, borderline dilatation of the aortic root and ascending aorta measuring 38 mm and 39 mm, respectively.  He underwent CTA of the chest/aorta, for previously noted mildly dilated aortic root in 05/2019, which demonstrated the ascending aorta was 3.9 cm.   He was seen in the office in 09/2021 and was without symptoms of angina or decompensation.  He was working through acquiring Medicare with plans to pursue TEE once this was completed.   He underwent MRI of the lumbar spine in 03/2022, which demonstrated an abdominal aorta measuring up to 3.3 cm, though was suboptimally evaluated.    He was last seen in the office in 04/2022 and was without symptoms of angina or decompensation.  AAA ultrasound from 06/05/2022 showed a stable 3.3 cm AAA when compared to study from 03/2018.  Echo from 05/2022 demonstrated an EF of 60 to 65%, no regional wall  motion abnormalities, moderate LVH, grade 2 diastolic dysfunction, normal RV systolic function and ventricular cavity size, mildly dilated left atrium, indeterminate number of cusps on the aortic valve with moderate calcification and moderate aortic valve stenosis with a mean gradient of 23 mmHg and a valve area by VTI of 1.13 cm, and normal size and structure aortic root.  He comes in doing well and is without symptoms of angina or decompensation.  No dyspnea, dizziness, presyncope, or syncope.  He is adherent and tolerating cardiac medications without issues.  No significant lower extremity swelling or progressive orthopnea.  He continues to drink 3-4 beers nightly and smoke a pack to a pack and 1/2/day.  He understands he needs to quit smoking, though was not yet ready to do so.   Labs independently reviewed: 09/2021 - potassium 4.5, BUN 16, serum creatinine 0.86, albumin 4.6, AST/ALT normal, TC 148, TG 69, HDL 56, LDL 78 05/2019 - TSH normal  Past Medical History:  Diagnosis Date   Arthritis    "maybe" - hands   Chest pain    a. ETT 06/14/2015: no st segment or T-waves changes during stress, mildly reduced exercise capacity, hypertensive response to exercise   Chicken pox    Chronic venous insufficiency    a. improved with support hose.   Erectile dysfunction    Essential hypertension    CONTROLLED ON MEDS   History of echocardiogram    a. 03/2016 Echo: EF 55-60%, no rwma, mild AS, triv AI, mildly dil Ao.   HLD (hyperlipidemia)  Measles    Mumps    Polysubstance abuse ([place])    a. ongoing tobacco and alcohol abuse   Wears dentures    full upper    Past Surgical History:  Procedure Laterality Date   APPENDECTOMY  1972   COLONOSCOPY WITH PROPOFOL N/A 08/27/2015   Procedure: COLONOSCOPY WITH PROPOFOL;  Surgeon: Lucilla Lame, MD;  Location: [place];  Service: Endoscopy;  Laterality: N/A;   POLYPECTOMY  08/27/2015   Procedure: POLYPECTOMY;  Surgeon: Lucilla Lame, MD;   Location: [place];  Service: Endoscopy;;    Current Medications: Current Meds  Medication Sig   aspirin 81 MG tablet Take 81 mg by mouth daily. AM   CINNAMON PO Take by mouth. AM   gabapentin (NEURONTIN) 100 MG capsule Take 100 mg by mouth 2 (two) times daily.   gabapentin (NEURONTIN) 300 MG capsule Take 300 mg by mouth at bedtime.   Multiple Vitamin (MULTIVITAMIN) capsule Take 1 capsule by mouth daily. AM   Omega-3 Fatty Acids (FISH OIL) 1000 MG CAPS Take 1 capsule by mouth.   [DISCONTINUED] amLODipine (NORVASC) 10 MG tablet Take 1 tablet (10 mg total) by mouth daily. Please schedule follow-up appointment for further refills. Thank you!   [DISCONTINUED] atorvastatin (LIPITOR) 40 MG tablet TAKE 1 TABLET(40 MG) BY MOUTH DAILY   [DISCONTINUED] carvedilol (COREG) 3.125 MG tablet Take 1 tablet (3.125 mg total) by mouth 2 (two) times daily.   [DISCONTINUED] lisinopril (ZESTRIL) 40 MG tablet TAKE 1 TABLET BY MOUTH EVERY DAY    Allergies:   Patient has no known allergies.   Social History   Socioeconomic History   Marital status: Married    Spouse name: Not on file   Number of children: Not on file   Years of education: Not on file   Highest education level: Not on file  Occupational History   Occupation: Full-time    Comment: Works in Chief Financial Officer at [employer] Use   Smoking status: Some Days    Packs/day: 1.50    Years: 45.00    Total pack years: 67.50    Types: Cigarettes    Start date: 03/27/1970   Smokeless tobacco: Never   Tobacco comments:    Smokes 1.5-2 packs per day   Substance and Sexual Activity   Alcohol use: Yes    Alcohol/week: 30.0 standard drinks of alcohol    Types: 30 Cans of beer per week    Comment: drinks 4 beers daily   Drug use: No   Sexual activity: Not on file  Other Topics Concern   Not on file  Social History Narrative   Not on file   Social Determinants of Health   Financial Resource Strain: Not on file  Food  Insecurity: Not on file  Transportation Needs: Not on file  Physical Activity: Not on file  Stress: Not on file  Social Connections: Not on file     Family History:  The patient's family history includes Heart disease in his mother; Pancreatic cancer in his father.  ROS:   12-point review of systems is negative unless otherwise noted in HPI   EKGs/Labs/Other Studies Reviewed:    Studies reviewed were summarized above. The additional studies were reviewed today:  2D echo 06/05/2022: 1. Left ventricular ejection fraction, by estimation, is 60 to 65%. The  left ventricle has normal function. The left ventricle has no regional  wall motion abnormalities. There is moderate left ventricular hypertrophy.  Left ventricular diastolic  parameters are consistent with Grade II diastolic dysfunction  (pseudonormalization). The average left ventricular global longitudinal  strain is -19.4 %.   2. Right ventricular systolic function is normal. The right ventricular  size is normal.   3. Left atrial size was mildly dilated.   4. The mitral valve is normal in structure. No evidence of mitral valve  regurgitation. No evidence of mitral stenosis.   5. The aortic valve has an indeterminant number of cusps. There is  moderate calcification of the aortic valve. Aortic valve regurgitation is  not visualized. Moderate aortic valve stenosis. Aortic valve area, by VTI  measures 1.13 cm. Aortic valve mean  gradient measures 23.0 mmHg. Aortic valve Vmax measures 3.56 m/s.   6. The inferior vena cava is normal in size with greater than 50%  respiratory variability, suggesting right atrial pressure of 3 mmHg.   Comparison(s): Previous AV PG's 48 mmHg max, 25 mmHg mean  __________  AAA ultrasound 06/05/2022: Summary:  Abdominal Aorta: There is evidence of abnormal dilatation of the proximal  Abdominal aorta. The largest aortic measurement is 3.3 cm. The largest  aortic diameter remains essentially  unchanged compared to prior exam.  Previous diameter measurement was 3.3  cm obtained on 03/22/18.  __________  2D echo 04/30/2021: 1. Left ventricular ejection fraction, by estimation, is 60 to 65%. The  left ventricle has normal function. The left ventricle has no regional  wall motion abnormalities. Left ventricular diastolic parameters are  consistent with Grade I diastolic  dysfunction (impaired relaxation).   2. Right ventricular systolic function is normal. The right ventricular  size is normal.   3. The mitral valve is normal in structure. No evidence of mitral valve  regurgitation. No evidence of mitral stenosis.   4. The aortic valve is normal in structure. There is severe calcifcation  of the aortic valve. Aortic valve regurgitation is not visualized.  Moderate aortic valve stenosis. Aortic valve area, by VTI measures 1.17  cm. Aortic valve mean gradient measures  24.5 mmHg. Aortic valve Vmax measures 3.46 m/s.   5. There is borderline dilatation of the ascending aorta, measuring 39  mm. There is borderline dilatation of the aortic root, measuring 38 mm. __________   2D echo 05/13/2019: 1. The left ventricle has normal systolic function, with an ejection  fraction of 55-60%. The cavity size was normal. There is mildly increased  left ventricular wall thickness of the septal wall. Left ventricular  diastolic Doppler parameters are  consistent with pseudonormalization.   2. The right ventricle has normal systolic function. The cavity was  normal. There is no increase in right ventricular wall thickness. Unable  to estimate RVSP.   3. Moderate stenosis of the aortic valve. Mean gradient of 24 mm Hg, peak  gradient of 44 mm Hg, estimated AVA 1.3 cmsq   4. There is dilatation of the aortic root 4.5 cm and of the ascending  aorta 4.2 cm. __________   2D echo 04/09/2016: - Left ventricle: The cavity size was normal. Wall thickness was    increased in a pattern of mild LVH.  Systolic function was normal.    The estimated ejection fraction was in the range of 55% to 60%.    Wall motion was normal; there were no regional wall motion    abnormalities.  - Aortic valve: Calcified annulus. Moderately thickened leaflets.    At the most, there was very mild stenosis. There was trivial    regurgitation. Valve area (VTI): 1.86 cm^2.  Valve area (Vmax):    1.74 cm^2. Valve area (Vmean): 1.69 cm^2.  - Aorta: The aorta was mildly dilated. __________   ETT 06/14/2015: There was no ST segment deviation noted during stress. No T wave inversion was noted during stress.   Normal treadmill stress test. Mildly reduced exercise capacity with hypertensive response to exercise.    EKG:  EKG is ordered today.  The EKG ordered today demonstrates NSR, 67 bpm, no acute ST-T changes  Recent Labs: No results found for requested labs within last 365 days.  Recent Lipid Panel    Component Value Date/Time   CHOL 148 09/27/2021 0959   CHOL 139 06/07/2019 0818   TRIG 69 09/27/2021 0959   HDL 56 09/27/2021 0959   HDL 49 06/07/2019 0818   CHOLHDL 2.6 09/27/2021 0959   VLDL 14 09/27/2021 0959   LDLCALC 78 09/27/2021 0959   LDLCALC 58 06/07/2019 0818    PHYSICAL EXAM:    VS:  BP 110/70 (BP Location: Left Arm, Patient Position: Sitting, Cuff Size: Normal)   Pulse 67   Ht '6\' 1"'$  (1.854 m)   Wt 229 lb 6.4 oz (104.1 kg)   SpO2 98%   BMI 30.27 kg/m   BMI: Body mass index is 30.27 kg/m.  Physical Exam Vitals reviewed.  Constitutional:      Appearance: He is well-developed.  HENT:     Head: Normocephalic and atraumatic.  Eyes:     General:        Right eye: No discharge.        Left eye: No discharge.  Neck:     Vascular: No JVD.  Cardiovascular:     Rate and Rhythm: Normal rate and regular rhythm.     Heart sounds: S1 normal and S2 normal. Heart sounds not distant. No midsystolic click and no opening snap. Murmur heard.     Systolic murmur is present with a grade of 2/6.      No friction rub.  Pulmonary:     Effort: Pulmonary effort is normal. No respiratory distress.     Breath sounds: Normal breath sounds. No decreased breath sounds, wheezing or rales.  Chest:     Chest wall: No tenderness.  Abdominal:     General: There is no distension.  Musculoskeletal:     Cervical back: Normal range of motion.     Right lower leg: No edema.     Left lower leg: No edema.  Skin:    General: Skin is warm and dry.     Nails: There is no clubbing.  Neurological:     Mental Status: He is alert and oriented to person, place, and time.  Psychiatric:        Speech: Speech normal.        Behavior: Behavior normal.        Thought Content: Thought content normal.        Judgment: Judgment normal.     Wt Readings from Last 3 Encounters:  11/04/22 229 lb 6.4 oz (104.1 kg)  05/06/22 223 lb 2 oz (101.2 kg)  09/27/21 216 lb 9.6 oz (98.2 kg)     ASSESSMENT & PLAN:   Aortic valve stenosis with mildly dilated aortic root: Concern as been for possible bicuspid aortic valve with moderate stenosis on prior echo.  Most recent echo from 05/2022 showed an aortic valve with an indeterminate number of cusps with moderate calcification and stable moderate stenosis.  Aortic root was documented to be normal in  size and structure.  In this setting, we will pursue surface echo in 05/2022 for further evaluation of aortic stenosis and prior noted dilated aortic root.  Ultimately, he will likely need a TEE for definitive evaluation, but would like to defer this at this time.    Abdominal aortic aneurysm: Stable dating back to 2019, measuring 3.3 cm on AAA ultrasound in 05/2022.  Follow-up ultrasound has been recommended for 05/2023, order has previously been placed.  Continue optimal blood pressure control with recommendation for complete smoking cessation.  He is aware of the risks of ongoing tobacco use.  HTN: Blood pressure is well-controlled in the office today.  He remains on amlodipine,  carvedilol, and lisinopril.  HLD: LDL 78 in 09/2021.  He remains on atorvastatin 40 mg.  Tobacco/tobacco use: Complete cessation is recommended.  Declines assistance today.  Not yet ready to fully quit.    Disposition: F/u with Dr. Fletcher Anon or an APP after echo and AAA ultrasound in 05/2023.   Medication Adjustments/Labs and Tests Ordered: Current medicines are reviewed at length with the patient today.  Concerns regarding medicines are outlined above. Medication changes, Labs and Tests ordered today are summarized above and listed in the Patient Instructions accessible in Encounters.   Signed, [name], PA-C 11/04/2022 3:47 PM     [facility] [address] [address] [place], [place] [address] [phone]

## 2022-11-04 ENCOUNTER — Encounter: Payer: Self-pay | Admitting: Physician Assistant

## 2022-11-04 ENCOUNTER — Ambulatory Visit: Payer: 59 | Attending: Physician Assistant | Admitting: Physician Assistant

## 2022-11-04 VITALS — BP 110/70 | HR 67 | Ht 73.0 in | Wt 229.4 lb

## 2022-11-04 DIAGNOSIS — I714 Abdominal aortic aneurysm, without rupture, unspecified: Secondary | ICD-10-CM | POA: Diagnosis not present

## 2022-11-04 DIAGNOSIS — I77810 Thoracic aortic ectasia: Secondary | ICD-10-CM | POA: Diagnosis not present

## 2022-11-04 DIAGNOSIS — I10 Essential (primary) hypertension: Secondary | ICD-10-CM | POA: Diagnosis not present

## 2022-11-04 DIAGNOSIS — I350 Nonrheumatic aortic (valve) stenosis: Secondary | ICD-10-CM

## 2022-11-04 DIAGNOSIS — Z720 Tobacco use: Secondary | ICD-10-CM

## 2022-11-04 DIAGNOSIS — E785 Hyperlipidemia, unspecified: Secondary | ICD-10-CM

## 2022-11-04 MED ORDER — LISINOPRIL 40 MG PO TABS: 40.0000 mg | ORAL_TABLET | Freq: Every day | ORAL | 3 refills | Status: DC

## 2022-11-04 MED ORDER — ATORVASTATIN CALCIUM 40 MG PO TABS: ORAL_TABLET | ORAL | 3 refills | Status: DC

## 2022-11-04 MED ORDER — CARVEDILOL 3.125 MG PO TABS: 3.1250 mg | ORAL_TABLET | Freq: Two times a day (BID) | ORAL | 3 refills | Status: DC

## 2022-11-04 MED ORDER — AMLODIPINE BESYLATE 10 MG PO TABS: 10.0000 mg | ORAL_TABLET | Freq: Every day | ORAL | 3 refills | Status: DC

## 2022-11-04 NOTE — Patient Instructions (Signed)
Medication Instructions:  No changes at this time.   *If you need a refill on your cardiac medications before your next appointment, please call your pharmacy*   Lab Work: None  If you have labs (blood work) drawn today and your tests are completely normal, you will receive your results only by: [facility] (if you have MyChart) OR A paper copy in the mail If you have any lab test that is abnormal or we need to change your treatment, we will call you to review the results.   Testing/Procedures: Please schedule Echo & AAA in August.   Your physician has requested that you have an echocardiogram. Echocardiography is a painless test that uses sound waves to create images of your heart. It provides your doctor with information about the size and shape of your heart and how well your heart's chambers and valves are working. This procedure takes approximately one hour. There are no restrictions for this procedure. Please do NOT wear cologne, perfume, aftershave, or lotions (deodorant is allowed). Please arrive 15 minutes prior to your appointment time.    Follow-Up: At [facility], you and your health needs are our priority.  As part of our continuing mission to provide you with exceptional heart care, we have created designated Provider Care Teams.  These Care Teams include your primary Cardiologist (physician) and Advanced Practice Providers (APPs -  Physician Assistants and Nurse Practitioners) who all work together to provide you with the care you need, when you need it.  We recommend signing up for the patient portal called "MyChart".  Sign up information is provided on this After Visit Summary.  MyChart is used to connect with patients for Virtual Visits (Telemedicine).  Patients are able to view lab/test results, encounter notes, upcoming appointments, etc.  Non-urgent messages can be sent to your provider as well.   To learn more about what you can do with MyChart, go to  [email or web address].    Your next appointment:   Follow up after testing  Provider:   [name], MD or [name], PA-C

## 2022-11-16 ENCOUNTER — Other Ambulatory Visit: Payer: Self-pay | Admitting: Family Medicine

## 2022-11-16 DIAGNOSIS — E041 Nontoxic single thyroid nodule: Secondary | ICD-10-CM

## 2022-11-25 ENCOUNTER — Ambulatory Visit: Payer: 59

## 2022-12-01 ENCOUNTER — Telehealth: Payer: Self-pay | Admitting: Family Medicine

## 2022-12-01 DIAGNOSIS — E041 Nontoxic single thyroid nodule: Secondary | ICD-10-CM

## 2022-12-01 NOTE — Telephone Encounter (Signed)
Please advise patient he is due for thyroid ultrasound to follow up on thyroid nodule. Order has been placed. He should get a call this week to schedule

## 2023-03-28 IMAGING — MR MR LUMBAR SPINE W/O CM
5 series · 31 of 48 positions shown · non-contrast
Comparison: None.

CLINICAL DATA: Acute right-sided back pain with sciatica
(2AE-K4-CM)

Right leg weakness [id number] (2AE-K4-CM)
EXAM:
MRI LUMBAR SPINE WITHOUT CONTRAST
TECHNIQUE: Multiplanar, multisequence MR imaging of the lumbar spine was
performed. No intravenous contrast was administered.

[Series 5: T2 · sagittal · 4.0mm · 0.81mm/px · 6 of 17 slices shown (1 of 2)]
[im 1/17]
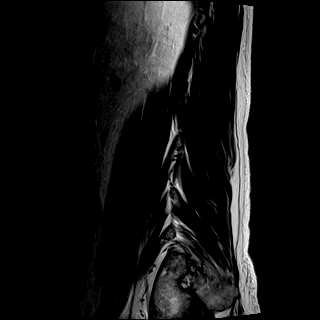
[im 4/17]
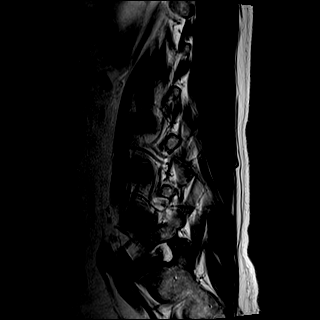
[im 7/17]
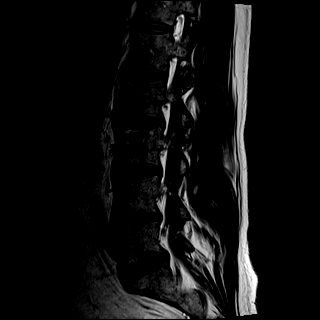
[im 10/17]
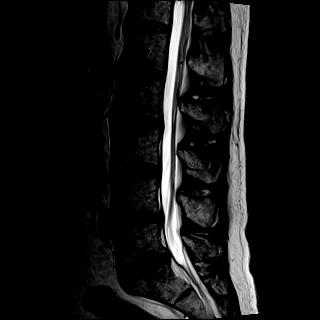
[im 13/17]
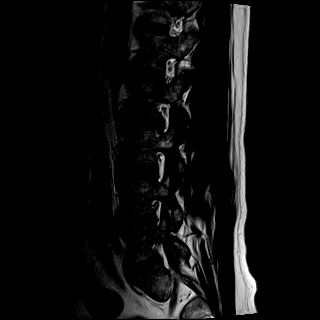
[im 17/17]
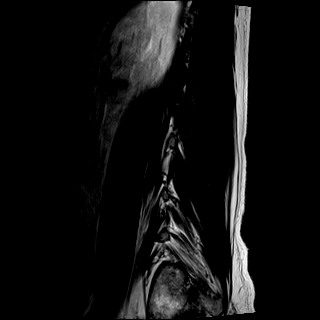

[Series 6: T1 · sagittal · 4.0mm · 0.81mm/px · 6 of 17 slices shown (1 of 2)]
[im 1/17]
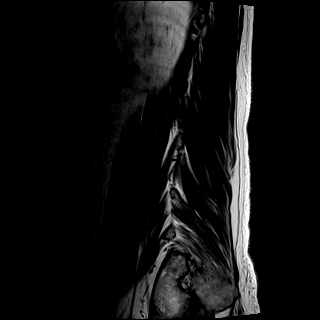
[im 4/17]
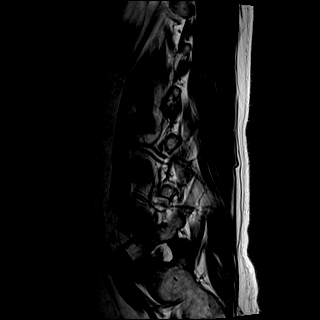
[im 7/17]
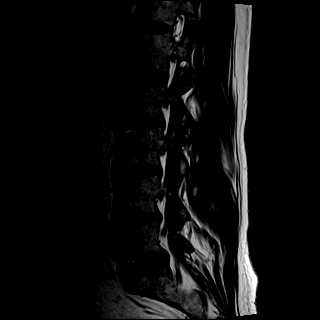
[im 10/17]
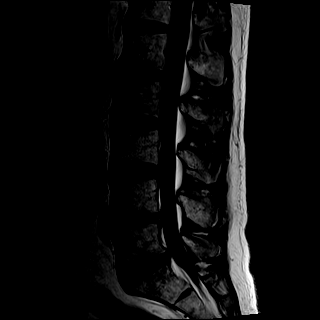
[im 13/17]
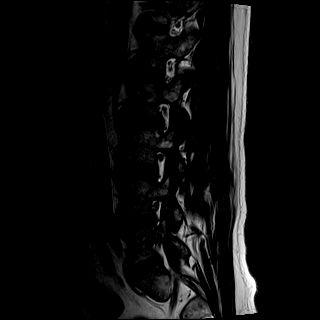
[im 17/17]
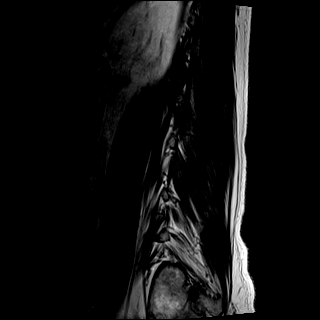

[Series 7: STIR · sagittal · 4.0mm · 0.41mm/px · 1 of 17 slices shown]
[im 1/17]
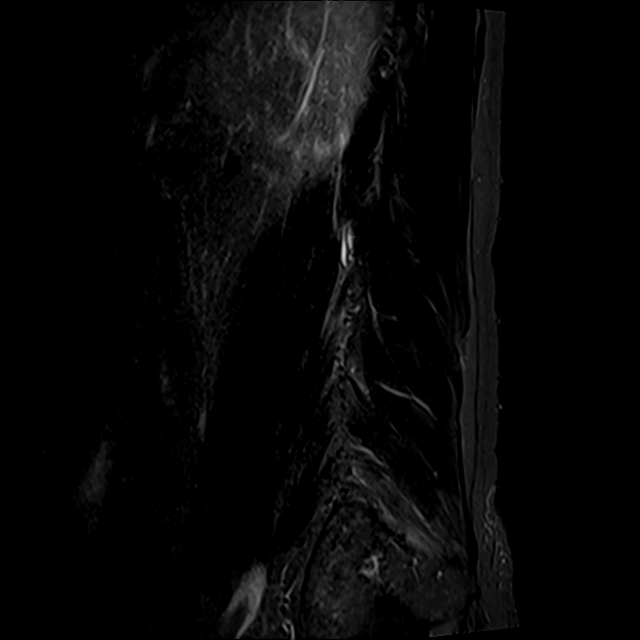

[Series 8: T2 · axial · 4.0mm · 0.78mm/px · z∈[-140,+103]mm · 9 of 40 slices shown (2 of 2)]
[im 1/40]
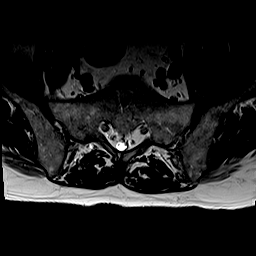
[im 6/40]
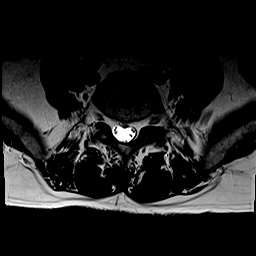
[im 12/40]
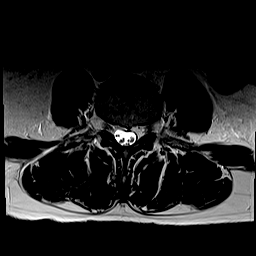
[im 17/40]
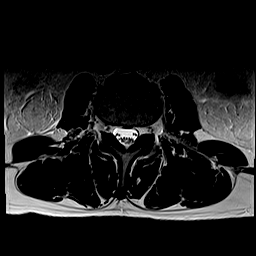
[im 20/40]
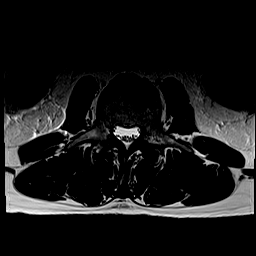
[im 23/40]
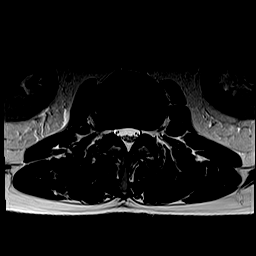
[im 28/40]
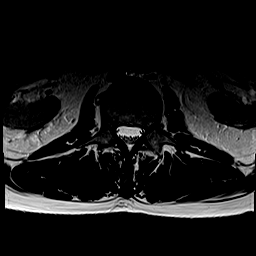
[im 34/40]
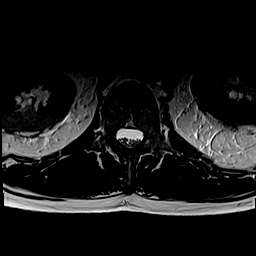
[im 40/40]
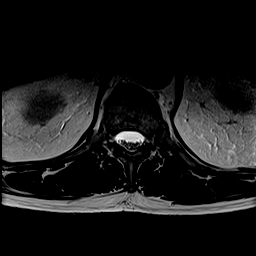

[Series 9: T1 · axial · 4.0mm · 0.39mm/px · z∈[-140,+103]mm · 9 of 40 slices shown (2 of 2)]
[im 1/40]
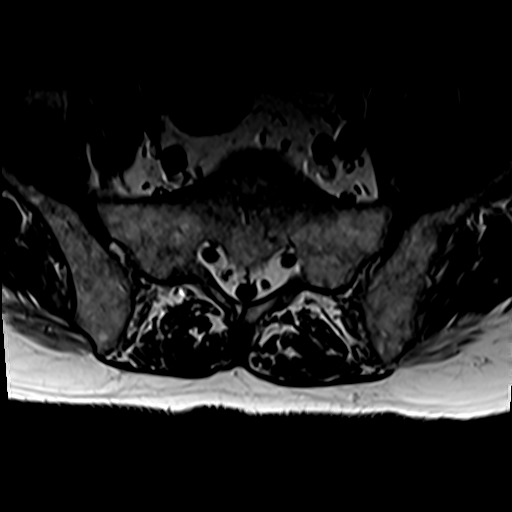
[im 6/40]
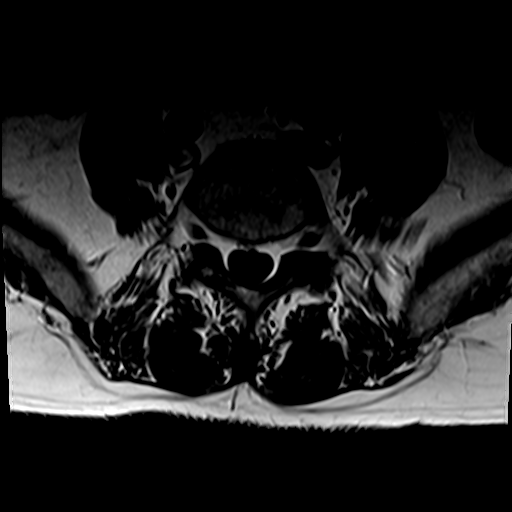
[im 12/40]
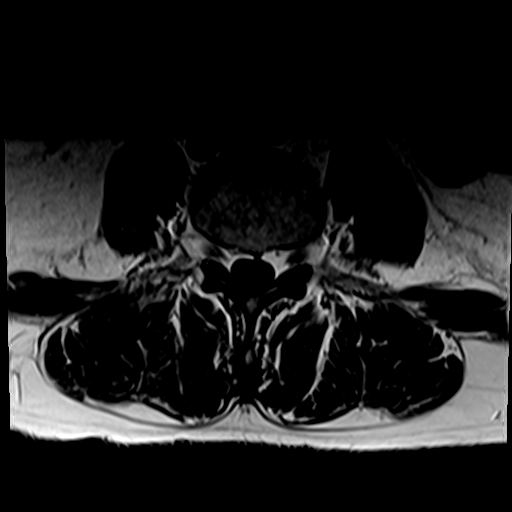
[im 17/40]
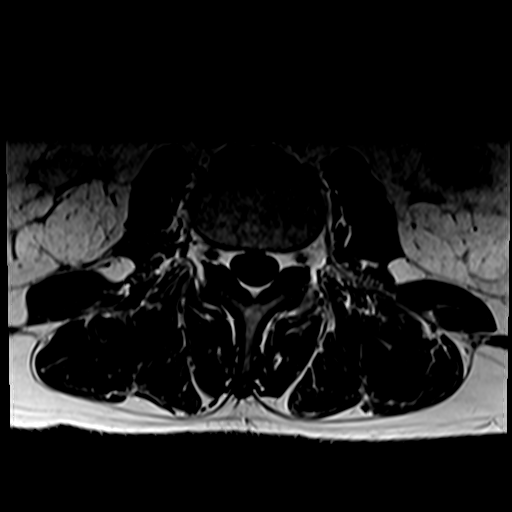
[im 20/40]
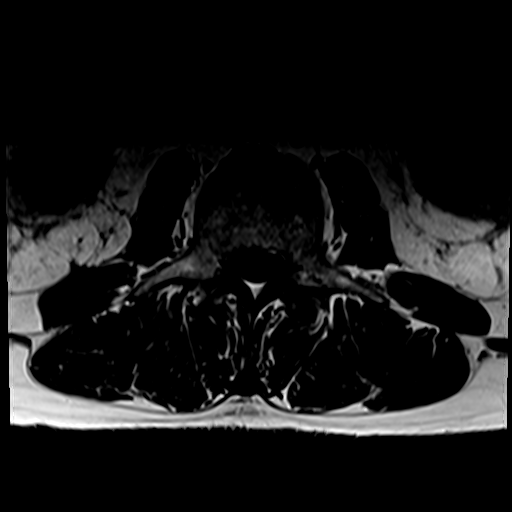
[im 23/40]
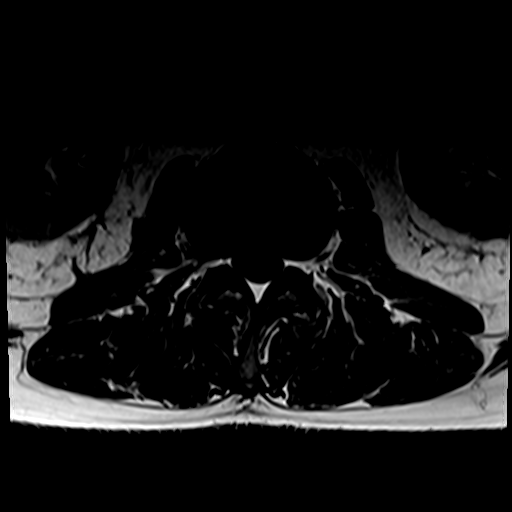
[im 28/40]
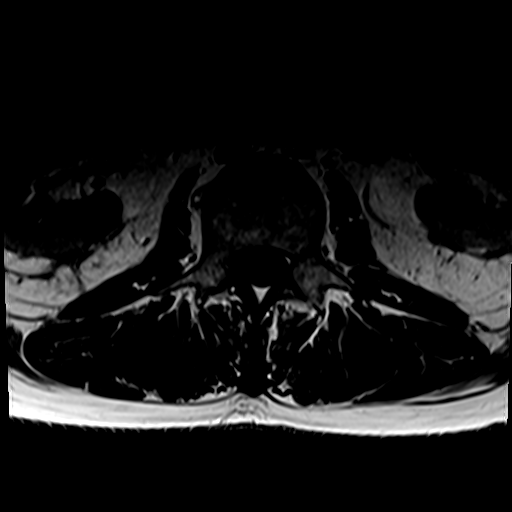
[im 34/40]
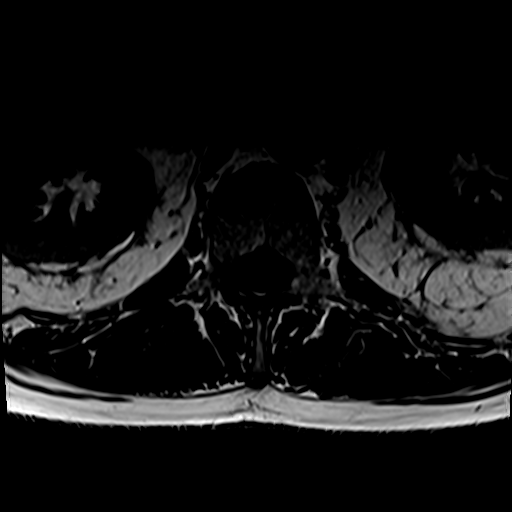
[im 40/40]
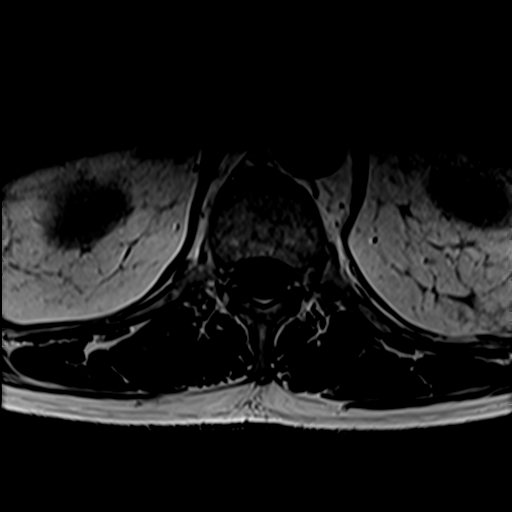

[31 of 48 positions shown; findings below may reference images not displayed]

FINDINGS: Segmentation: Standard segmentation is assumed. Inferior-most fully
formed intervertebral disc labeled L5-S1.

Alignment:  No substantial sagittal subluxation.

Vertebrae: Vertebral body heights are maintained. No focal marrow
edema to suggest acute fracture or discitis/osteomyelitis.

Conus medullaris and cauda equina: Conus is not imaged.

Paraspinal and other soft tissues: The abdominal aorta likely
measures up to 3.3 cm, suboptimally evaluated on this study.

Disc levels:

T12-L1: No significant disc protrusion, foraminal stenosis, or canal
stenosis.

L1-L2: No significant disc protrusion, foraminal stenosis, or canal
stenosis.

L2-L3: Mild broad disc bulge with superimposed right far
lateral/extraforaminal disc protrusion which contacts and
potentially irritates the exited right L2 nerve (see series [DATE],
image 4; series 8, image 18). No significant canal or left foraminal
stenosis.

L3-L4: Mild broad disc bulge, ligamentum flavum thickening. Mild
facet arthropathy. No significant canal or foraminal stenosis.

L4-L5: Small broad disc bulge with superimposed small central disc
protrusion with annular fissure and slight caudal migration. Mild
subarticular recess narrowing without significant canal stenosis. No
significant foraminal stenosis.

L5-S1: Broad disc bulge with superimposed small central disc
protrusion and small left foraminal disc protrusion. No significant
canal stenosis. Mild left foraminal stenosis.
IMPRESSION: 1. At L2-L3, right far lateral/extraforaminal disc protrusion which
contacts and potentially irritates the exited right L2 nerve.
2. At L4-L5, mild left greater than right subarticular recess
stenosis.
3. At L5-S1, mild left foraminal stenosis.
4. The abdominal aorta likely measures up to 3.3 cm, suboptimally
evaluated on this study. Recommend non-urgent ultrasound follow-up
to confirm and better evaluate, particularly given recommendations
on prior aortic ultrasound screening from March 22, 2018.

## 2023-04-25 IMAGING — US US THYROID
1 series · 13 of 25 positions shown · non-contrast
Comparison: 06/04/2020

CLINICAL DATA: 65-year-old male with a history of follow-up nodules

EXAM:
THYROID ULTRASOUND
TECHNIQUE: Ultrasound examination of the thyroid gland and adjacent soft
tissues was performed.

[Series 1: us thyroid · 0.07mm/px · 13 of 37 slices shown]
[im 1/37]
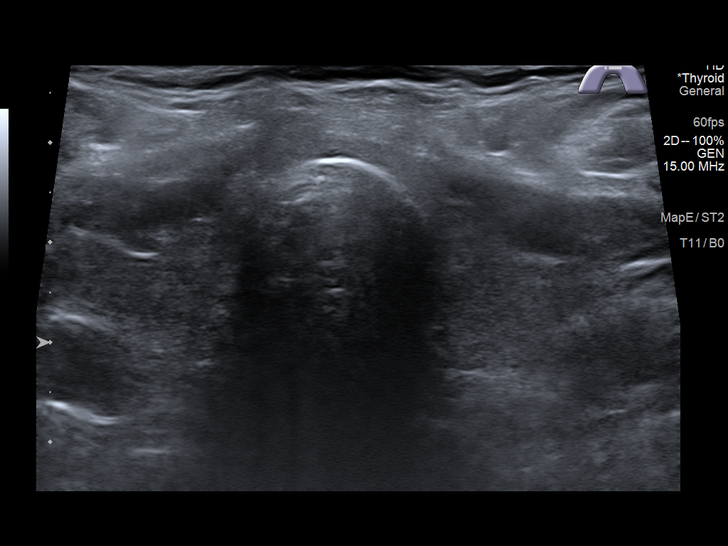
[im 4/37]
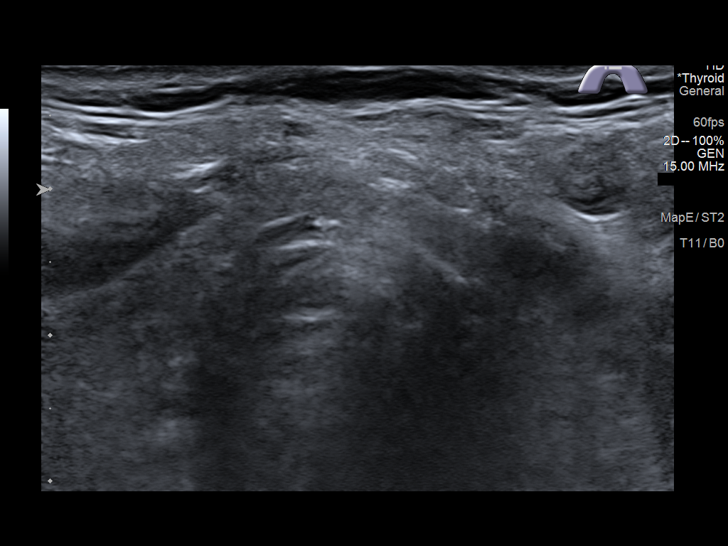
[im 7/37]
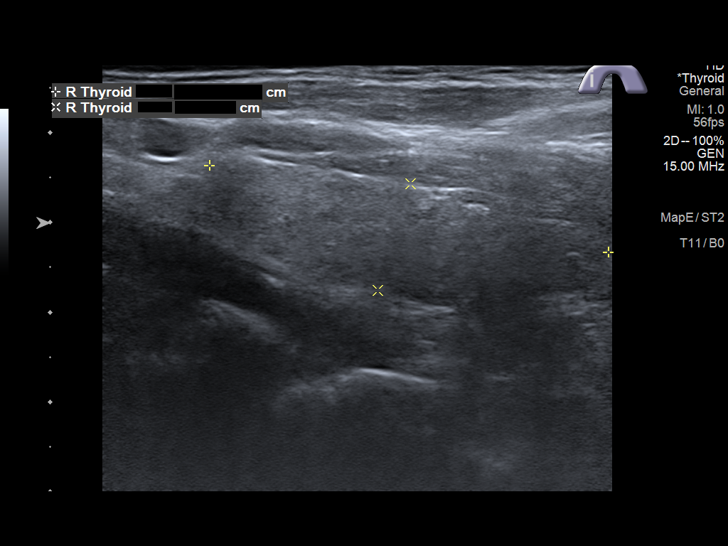
[im 10/37]
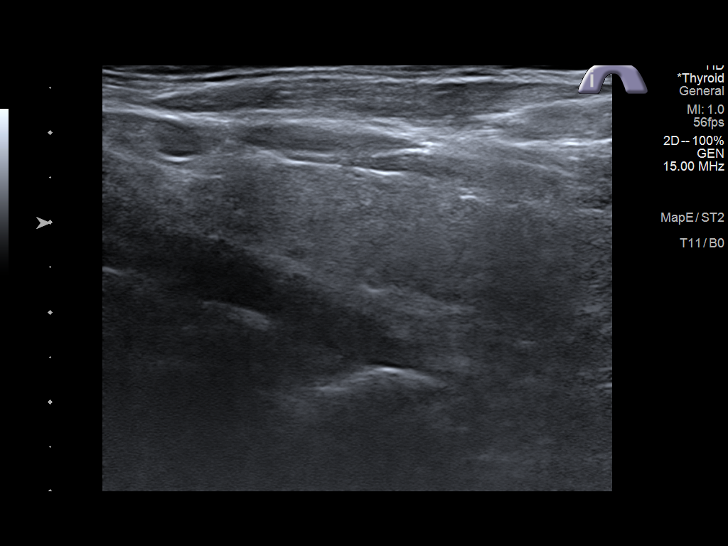
[im 13/37]
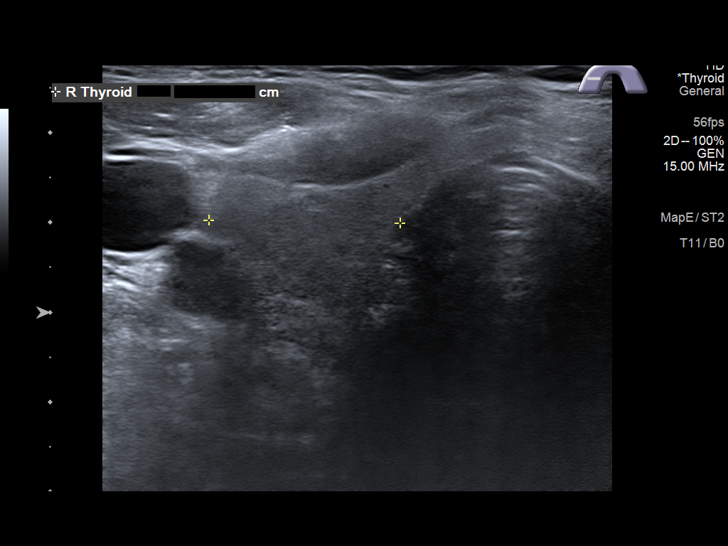
[im 16/37]
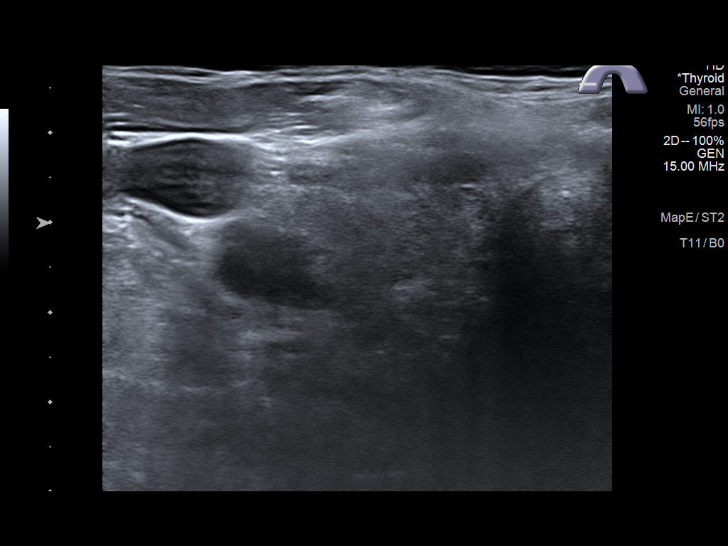
[im 19/37]
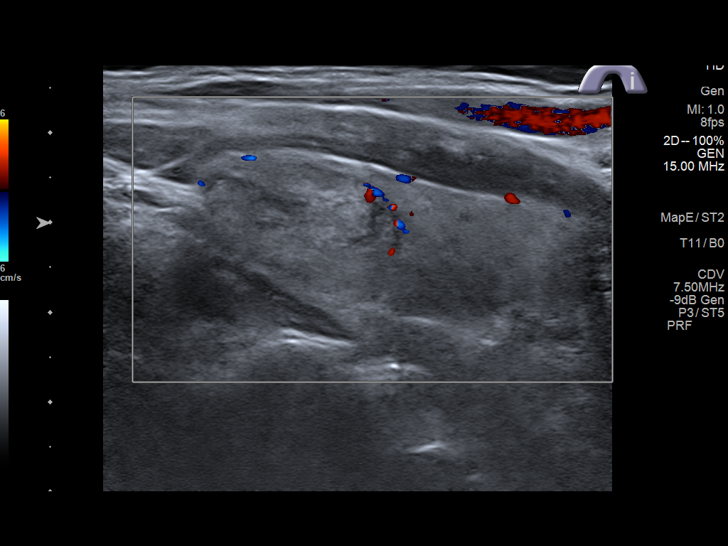
[im 22/37]
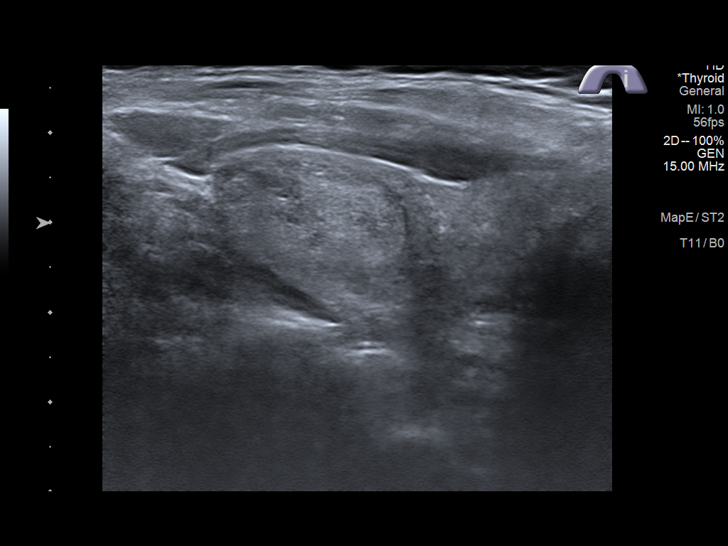
[im 25/37]
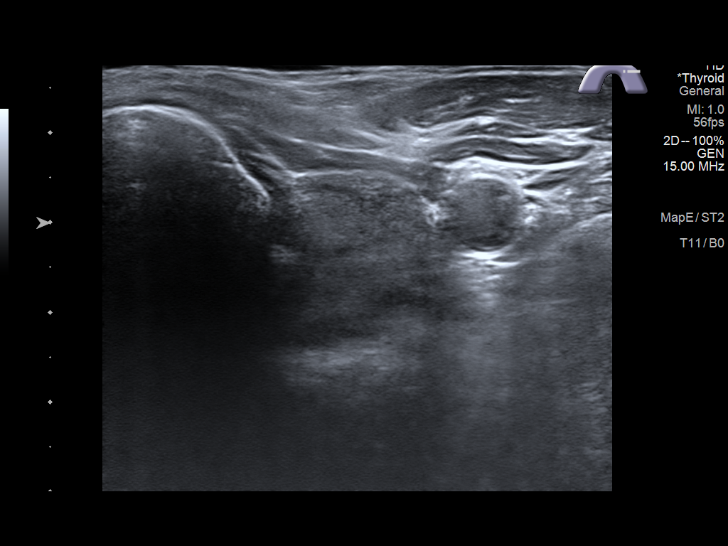
[im 28/37]
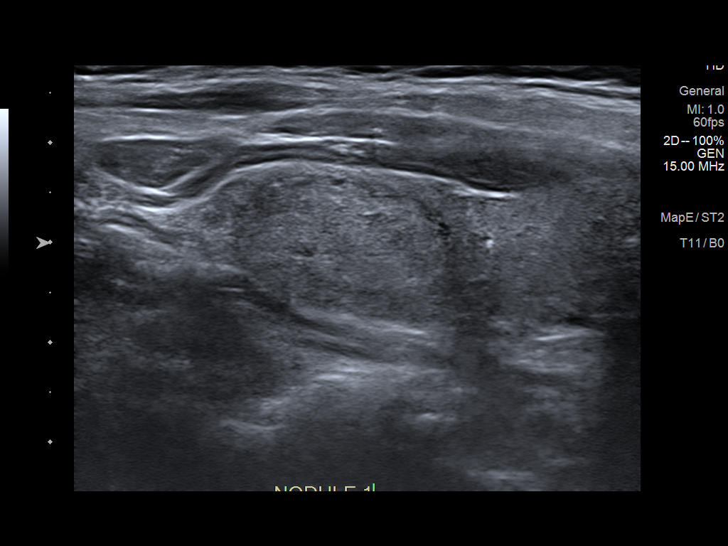
[im 31/37]
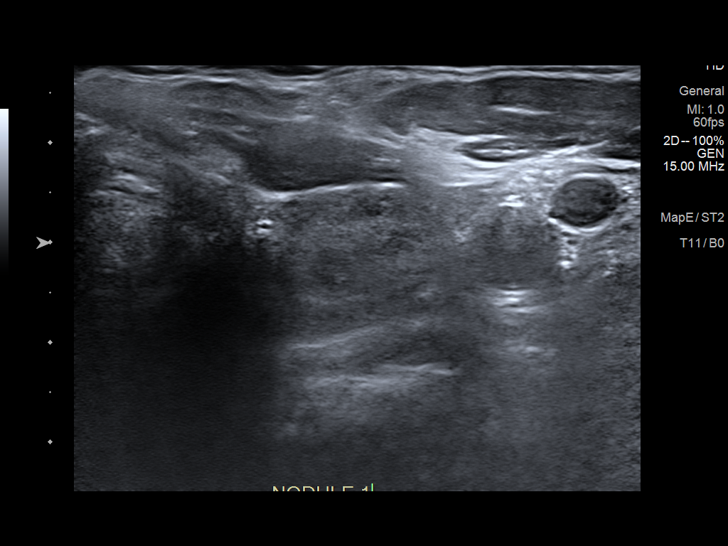
[im 34/37]
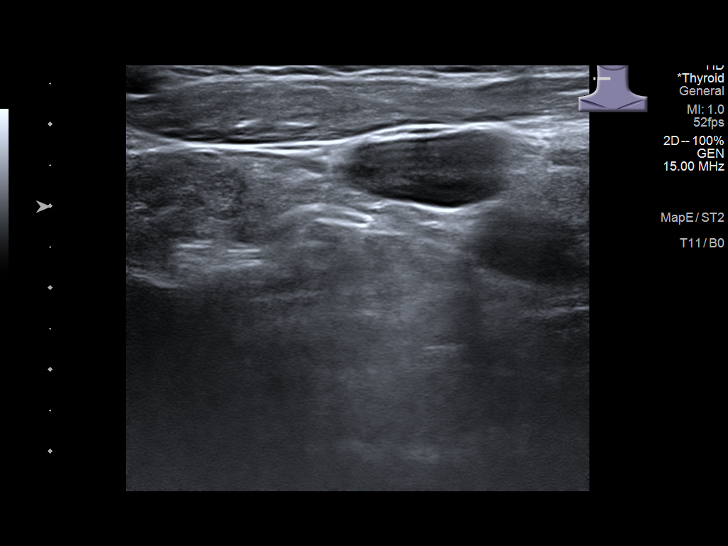
[im 37/37]
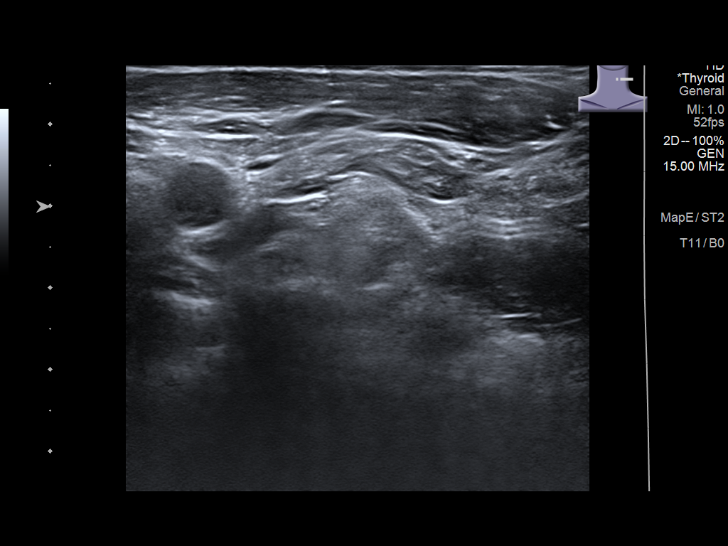

[13 of 25 positions shown; findings below may reference images not displayed]

FINDINGS: Parenchymal Echotexture: Mildly heterogenous

Isthmus: 0.4 cm

Right lobe: 4.5 cm x 1.2 cm x 2.1 cm

Left lobe: 4.4 cm x 1.7 cm x 2.2 cm

_________________________________________________________

Estimated total number of nodules >/= 1 cm: 1

Number of spongiform nodules >/=  2 cm not described below (TR1): 0

Number of mixed cystic and solid nodules >/= 1.5 cm not described
below (TR2): 0

_________________________________________________________

Left-sided nodule labeled 1, unchanged in size, 2.3 cm, previously
2.4 cm. This remains TR 3 and meets criteria for continued
surveillance.

No adenopathy
IMPRESSION: Left thyroid nodule (labeled 1, TR 3, 2.3 cm) meets criteria for
continued surveillance, as designated by the newly established ACR
TI-RADS criteria. Surveillance ultrasound study recommended to be
performed annually up to 5 years, which would be May 2024.

Recommendations follow those established by the new ACR TI-RADS
criteria ([HOSPITAL] 8976;[DATE]).

## 2023-05-22 NOTE — Telephone Encounter (Signed)
Patient is scheduled for 9/9.

## 2023-05-26 ENCOUNTER — Ambulatory Visit: Payer: 59 | Attending: Cardiovascular Disease

## 2023-05-26 ENCOUNTER — Other Ambulatory Visit: Payer: Self-pay

## 2023-05-26 ENCOUNTER — Ambulatory Visit (INDEPENDENT_AMBULATORY_CARE_PROVIDER_SITE_OTHER): Payer: 59

## 2023-05-26 DIAGNOSIS — I714 Abdominal aortic aneurysm, without rupture, unspecified: Secondary | ICD-10-CM

## 2023-05-26 DIAGNOSIS — I7141 Pararenal abdominal aortic aneurysm, without rupture: Secondary | ICD-10-CM | POA: Diagnosis not present

## 2023-05-26 DIAGNOSIS — I350 Nonrheumatic aortic (valve) stenosis: Secondary | ICD-10-CM

## 2023-05-26 LAB — ECHOCARDIOGRAM COMPLETE
AR max vel: 1.84 cm2
AV Area VTI: 2.11 cm2
AV Area mean vel: 1.94 cm2
AV Mean grad: 22.5 mmHg
AV Peak grad: 44 mmHg
Ao pk vel: 3.32 m/s
Area-P 1/2: 4.73 cm2
Calc EF: 53.8 %
S' Lateral: 3.3 cm
Single Plane A2C EF: 53.3 %
Single Plane A4C EF: 52.1 %

## 2023-06-02 ENCOUNTER — Other Ambulatory Visit: Payer: Self-pay | Admitting: *Deleted

## 2023-06-02 DIAGNOSIS — I714 Abdominal aortic aneurysm, without rupture, unspecified: Secondary | ICD-10-CM

## 2023-06-29 ENCOUNTER — Ambulatory Visit: Payer: 59 | Attending: Physician Assistant | Admitting: Physician Assistant

## 2023-06-29 ENCOUNTER — Encounter: Payer: Self-pay | Admitting: Physician Assistant

## 2023-06-29 VITALS — BP 128/80 | HR 71 | Ht 73.0 in | Wt 226.6 lb

## 2023-06-29 DIAGNOSIS — R072 Precordial pain: Secondary | ICD-10-CM

## 2023-06-29 DIAGNOSIS — I350 Nonrheumatic aortic (valve) stenosis: Secondary | ICD-10-CM | POA: Diagnosis not present

## 2023-06-29 DIAGNOSIS — I77810 Thoracic aortic ectasia: Secondary | ICD-10-CM

## 2023-06-29 DIAGNOSIS — I714 Abdominal aortic aneurysm, without rupture, unspecified: Secondary | ICD-10-CM

## 2023-06-29 DIAGNOSIS — E785 Hyperlipidemia, unspecified: Secondary | ICD-10-CM

## 2023-06-29 DIAGNOSIS — Z720 Tobacco use: Secondary | ICD-10-CM

## 2023-06-29 DIAGNOSIS — I10 Essential (primary) hypertension: Secondary | ICD-10-CM | POA: Diagnosis not present

## 2023-06-29 MED ORDER — METOPROLOL TARTRATE 100 MG PO TABS
ORAL_TABLET | ORAL | 0 refills | Status: DC
Start: 2023-06-29 — End: 2023-06-29

## 2023-06-29 NOTE — Progress Notes (Signed)
Cardiology Office Note    Date:  06/29/2023   ID:  Chase [name], DOB 25-Dec-1955, MRN [id number]  PCP:  Malva Limes, MD  Cardiologist:  Lorine Bears, MD  Electrophysiologist:  None   Chief Complaint: Follow-up  History of Present Illness:   Chase [name] is a 67 y.o. 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   Prior ETT in 05/2015, for atypical chest pain, showed no significant EKG changes concerning for ischemia with a hypertensive response to exercise.  He has been monitored periodically with echocardiograms for aortic stenosis with echo from 04/2021 demonstrating an EF of 60 to 65%, no regional wall motion abnormalities, grade 1 diastolic dysfunction, normal RV systolic function and ventricular cavity size, severe calcification of the aortic valve with moderate stenosis with a mean gradient of 24.5 mmHg and valve area of 1.17 cm, borderline dilatation of the aortic root and ascending aorta measuring 38 mm and 39 mm, respectively.  He underwent CTA of the chest/aorta, for previously noted mildly dilated aortic root in 05/2019, which demonstrated the ascending aorta was 3.9 cm.  He was seen in the office in 09/2021 and was without symptoms of angina or decompensation.  He was working through acquiring Medicare with plans to pursue TEE once this was completed.  He underwent MRI of the lumbar spine in 03/2022, which demonstrated an abdominal aorta measuring up to 3.3 cm, though was suboptimally evaluated.  AAA ultrasound from 06/05/2022 showed a stable 3.3 cm AAA when compared to study from 03/2018.  Echo from 05/2022 demonstrated an EF of 60 to 65%, no regional wall motion  abnormalities, moderate LVH, grade 2 diastolic dysfunction, normal RV systolic function and ventricular cavity size, mildly dilated left atrium, indeterminate number of cusps on the aortic valve with moderate calcification and moderate aortic valve stenosis with a mean gradient of 23 mmHg and a valve area by VTI of 1.13 cm, and normal size and structure aortic root.  He was last seen in the office in 05/2022 and continued to smoke one 1.5 packs daily and was not ready to quit.  Echo from 05/2023 showed an EF of 60 to 65%, no regional wall motion abnormalities, moderate LVH of the basal septal segment, normal LV diastolic function parameters, normal RV systolic function with mildly enlarged ventricular cavity size, moderate aortic stenosis with a mean gradient of 25 mmHg, mild dilatation of the aortic root measuring 44 mm and mild dilatation of the ascending aorta measuring 41 mm, and an estimated right atrial pressure of 3 mmHg.  Abdominal aortic ultrasound from 05/2023 showed the largest aortic measurement of 2.6 cm (prior 3.3 cm), with the supraceliac portion of the aorta not visualized due to overlying bowel gas.  He comes in doing well today and is without symptoms of angina or cardiac decompensation.  No dyspnea, dizziness, presyncope, or syncope.  He is adherent and tolerating cardiac medications.  No lower extremity swelling or progressive orthopnea.  He continues to smoke approximately half pack daily.  He reports being told he was diagnosed with a cardiac murmur around age 59.   Labs independently reviewed: 09/2021 - potassium 4.5, BUN 16, serum creatinine 0.86, albumin 4.6, AST/ALT normal, TC 148, TG 69, HDL  56, LDL 78 05/2019 - TSH normal  Past Medical History:  Diagnosis Date   Arthritis    "maybe" - hands   Chest pain    a. ETT 06/14/2015: no st segment or T-waves changes during stress, mildly reduced exercise capacity, hypertensive response to exercise   Chicken pox    Chronic venous  insufficiency    a. improved with support hose.   Erectile dysfunction    Essential hypertension    CONTROLLED ON MEDS   History of echocardiogram    a. 03/2016 Echo: EF 55-60%, no rwma, mild AS, triv AI, mildly dil Ao.   HLD (hyperlipidemia)    Measles    Mumps    Polysubstance abuse (HCC)    a. ongoing tobacco and alcohol abuse   Wears dentures    full upper    Past Surgical History:  Procedure Laterality Date   APPENDECTOMY  1972   COLONOSCOPY WITH PROPOFOL N/A 08/27/2015   Procedure: COLONOSCOPY WITH PROPOFOL;  Surgeon: Midge Minium, MD;  Location: [facility];  Service: Endoscopy;  Laterality: N/A;   POLYPECTOMY  08/27/2015   Procedure: POLYPECTOMY;  Surgeon: Midge Minium, MD;  Location: [facility];  Service: Endoscopy;;    Current Medications: Current Meds  Medication Sig   amLODipine (NORVASC) 10 MG tablet Take 1 tablet (10 mg total) by mouth daily. Please schedule follow-up appointment for further refills. Thank you!   aspirin 81 MG tablet Take 81 mg by mouth daily. AM   atorvastatin (LIPITOR) 40 MG tablet TAKE 1 TABLET(40 MG) BY MOUTH DAILY   carvedilol (COREG) 3.125 MG tablet Take 1 tablet (3.125 mg total) by mouth 2 (two) times daily.   CINNAMON PO Take by mouth. AM   gabapentin (NEURONTIN) 100 MG capsule Take 100 mg by mouth 2 (two) times daily.   gabapentin (NEURONTIN) 300 MG capsule Take 300 mg by mouth at bedtime.   lisinopril (ZESTRIL) 40 MG tablet Take 1 tablet (40 mg total) by mouth daily.   Multiple Vitamin (MULTIVITAMIN) capsule Take 1 capsule by mouth daily. AM   Omega-3 Fatty Acids (FISH OIL) 1000 MG CAPS Take 1 capsule by mouth.   omeprazole (PRILOSEC OTC) 20 MG tablet Take 20 mg by mouth daily.   [DISCONTINUED] metoprolol tartrate (LOPRESSOR) 100 MG tablet TAKE 1 TABLET 2 HR PRIOR TO CARDIAC PROCEDURE    Allergies:   Patient has no known allergies.   Social History   Socioeconomic History   Marital status: Married    Spouse name: Not  on file   Number of children: Not on file   Years of education: Not on file   Highest education level: Not on file  Occupational History   Occupation: Full-time    Comment: Works in Production designer, theatre/television/film at [employer]  Tobacco Use   Smoking status: Some Days    Current packs/day: 1.50    Average packs/day: 1.5 packs/day for 53.3 years (79.9 ttl pk-yrs)    Types: Cigarettes    Start date: 03/27/1970   Smokeless tobacco: Never   Tobacco comments:    Smokes 1.5-2 packs per day   Substance and Sexual Activity   Alcohol use: Yes    Alcohol/week: 30.0 standard drinks of alcohol    Types: 30 Cans of beer per week    Comment: drinks 4 beers daily   Drug use: No   Sexual activity: Not on file  Other Topics Concern   Not on file  Social History Narrative   Not on  file   Social Determinants of Health   Financial Resource Strain: Not on file  Food Insecurity: Not on file  Transportation Needs: Not on file  Physical Activity: Not on file  Stress: Not on file  Social Connections: Not on file     Family History:  The patient's family history includes Heart disease in his mother; Pancreatic cancer in his father.  ROS:   12-point review of systems is negative unless otherwise noted in the HPI.   EKGs/Labs/Other Studies Reviewed:    Studies reviewed were summarized above. The additional studies were reviewed today:  Abdominal aortic ultrasound 05/26/2023: Summary:  Abdominal Aorta: The largest aortic measurement is 2.6 cm. The supraceliac  portion of the aorta was not seen due to overlying bowel gas. Previous  diameter measurement was 3.3 cm obtained on 06/05/22.  __________  2D echo 05/26/2023: 1. Left ventricular ejection fraction, by estimation, is 60 to 65%. The  left ventricle has normal function. The left ventricle has no regional  wall motion abnormalities. There is moderate left ventricular hypertrophy  of the basal-septal segment. Left  ventricular diastolic parameters were  normal.   2. Right ventricular systolic function is normal. The right ventricular  size is mildly enlarged.   3. The mitral valve is normal in structure. No evidence of mitral valve  regurgitation.   4. Mean AV gradient , PG , Vmax 3.59m/s. The aortic valve is  calcified. Aortic valve regurgitation is not visualized. Moderate aortic  valve stenosis.   5. Aortic dilatation noted. There is mild dilatation of the aortic root,  measuring 44 mm. There is mild dilatation of the ascending aorta,  measuring 41 mm.   6. The inferior vena cava is normal in size with greater than 50%  respiratory variability, suggesting right atrial pressure of 3 mmHg.  __________  2D echo 06/05/2022: 1. Left ventricular ejection fraction, by estimation, is 60 to 65%. The  left ventricle has normal function. The left ventricle has no regional  wall motion abnormalities. There is moderate left ventricular hypertrophy.  Left ventricular diastolic  parameters are consistent with Grade II diastolic dysfunction  (pseudonormalization). The average left ventricular global longitudinal  strain is -19.4 %.   2. Right ventricular systolic function is normal. The right ventricular  size is normal.   3. Left atrial size was mildly dilated.   4. The mitral valve is normal in structure. No evidence of mitral valve  regurgitation. No evidence of mitral stenosis.   5. The aortic valve has an indeterminant number of cusps. There is  moderate calcification of the aortic valve. Aortic valve regurgitation is  not visualized. Moderate aortic valve stenosis. Aortic valve area, by VTI  measures 1.13 cm. Aortic valve mean  gradient measures 23.0 mmHg. Aortic valve Vmax measures 3.56 m/s.   6. The inferior vena cava is normal in size with greater than 50%  respiratory variability, suggesting right atrial pressure of 3 mmHg.   Comparison(s): Previous AV PG's 48 mmHg max, 25 mmHg mean  __________   AAA ultrasound  06/05/2022: Summary:  Abdominal Aorta: There is evidence of abnormal dilatation of the proximal  Abdominal aorta. The largest aortic measurement is 3.3 cm. The largest  aortic diameter remains essentially unchanged compared to prior exam.  Previous diameter measurement was 3.3  cm obtained on 03/22/18.  __________   2D echo 04/30/2021: 1. Left ventricular ejection fraction, by estimation, is 60 to 65%. The  left ventricle has normal function. The left ventricle has  no regional  wall motion abnormalities. Left ventricular diastolic parameters are  consistent with Grade I diastolic  dysfunction (impaired relaxation).   2. Right ventricular systolic function is normal. The right ventricular  size is normal.   3. The mitral valve is normal in structure. No evidence of mitral valve  regurgitation. No evidence of mitral stenosis.   4. The aortic valve is normal in structure. There is severe calcifcation  of the aortic valve. Aortic valve regurgitation is not visualized.  Moderate aortic valve stenosis. Aortic valve area, by VTI measures 1.17  cm. Aortic valve mean gradient measures  24.5 mmHg. Aortic valve Vmax measures 3.46 m/s.   5. There is borderline dilatation of the ascending aorta, measuring 39  mm. There is borderline dilatation of the aortic root, measuring 38 mm. __________   2D echo 05/13/2019: 1. The left ventricle has normal systolic function, with an ejection  fraction of 55-60%. The cavity size was normal. There is mildly increased  left ventricular wall thickness of the septal wall. Left ventricular  diastolic Doppler parameters are  consistent with pseudonormalization.   2. The right ventricle has normal systolic function. The cavity was  normal. There is no increase in right ventricular wall thickness. Unable  to estimate RVSP.   3. Moderate stenosis of the aortic valve. Mean gradient of 24 mm Hg, peak  gradient of 44 mm Hg, estimated AVA 1.3 cmsq   4. There is  dilatation of the aortic root 4.5 cm and of the ascending  aorta 4.2 cm. __________   2D echo 04/09/2016: - Left ventricle: The cavity size was normal. Wall thickness was    increased in a pattern of mild LVH. Systolic function was normal.    The estimated ejection fraction was in the range of 55% to 60%.    Wall motion was normal; there were no regional wall motion    abnormalities.  - Aortic valve: Calcified annulus. Moderately thickened leaflets.    At the most, there was very mild stenosis. There was trivial    regurgitation. Valve area (VTI): 1.86 cm^2. Valve area (Vmax):    1.74 cm^2. Valve area (Vmean): 1.69 cm^2.  - Aorta: The aorta was mildly dilated. __________   ETT 06/14/2015: There was no ST segment deviation noted during stress. No T wave inversion was noted during stress.   Normal treadmill stress test. Mildly reduced exercise capacity with hypertensive response to exercise.   EKG:  EKG is ordered today.  The EKG ordered today demonstrates NSR, 71 bpm, no acute ST/T changes  Recent Labs: No results found for requested labs within last 365 days.  Recent Lipid Panel    Component Value Date/Time   CHOL 148 09/27/2021 0959   CHOL 139 06/07/2019 0818   TRIG 69 09/27/2021 0959   HDL 56 09/27/2021 0959   HDL 49 06/07/2019 0818   CHOLHDL 2.6 09/27/2021 0959   VLDL 14 09/27/2021 0959   LDLCALC 78 09/27/2021 0959   LDLCALC 58 06/07/2019 0818    PHYSICAL EXAM:    VS:  BP 128/80 (BP Location: Left Arm, Patient Position: Sitting, Cuff Size: Normal)   Pulse 71   Ht 6\' 1"  (1.854 m)   Wt 226 lb 9.6 oz (102.8 kg)   SpO2 94%   BMI 29.90 kg/m   BMI: Body mass index is 29.9 kg/m.  Physical Exam Vitals reviewed.  Constitutional:      Appearance: He is well-developed.  HENT:     Head: Normocephalic and  atraumatic.  Eyes:     General:        Right eye: No discharge.        Left eye: No discharge.  Neck:     Vascular: No JVD.  Cardiovascular:     Rate and  Rhythm: Normal rate and regular rhythm.     Pulses:          Posterior tibial pulses are 2+ on the right side and 2+ on the left side.     Heart sounds: S1 normal and S2 normal. Heart sounds not distant. No midsystolic click and no opening snap. Murmur heard.     Harsh midsystolic murmur is present with a grade of 3/6 at the upper right sternal border radiating to the neck.     No friction rub.  Pulmonary:     Effort: Pulmonary effort is normal. No respiratory distress.     Breath sounds: Normal breath sounds. No decreased breath sounds, wheezing or rales.  Chest:     Chest wall: No tenderness.  Abdominal:     General: There is no distension.  Musculoskeletal:     Cervical back: Normal range of motion.     Right lower leg: No edema.     Left lower leg: No edema.  Skin:    General: Skin is warm and dry.     Nails: There is no clubbing.  Neurological:     Mental Status: He is alert and oriented to person, place, and time.  Psychiatric:        Speech: Speech normal.        Behavior: Behavior normal.        Thought Content: Thought content normal.        Judgment: Judgment normal.     Wt Readings from Last 3 Encounters:  06/29/23 226 lb 9.6 oz (102.8 kg)  11/04/22 229 lb 6.4 oz (104.1 kg)  05/06/22 223 lb 2 oz (101.2 kg)     ASSESSMENT & PLAN:   Aortic valve stenosis: Overall stable by echo 05/26/2023 when compared to study from 05/2022.  Concern as been for possible bicuspid aortic valve with moderate stenosis on prior echo.  Ultimately, he will likely need a TEE for definitive evaluation.  Should he have progression of aortic or aortic valve disease, he would need diagnostic R/LHC prior to pursuing intervention.  Repeat surface echo in 12 months.  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  Schedule CTA  aorta.  AAA: Stable to improved on ultrasound last month with a supraceliac portion of the aorta not well-visualized.  Repeat imaging in 12 months.  Maintain optimal blood pressure control.  Complete smoking cessation is recommended.  He is aware of the risks of ongoing tobacco use.  HTN: Blood pressure is well-controlled in the office today.  Remains on amlodipine 10 mg, carvedilol 3.125 mg twice daily, and lisinopril 40 mg.  Check renal function and electrolytes.  Coronary artery calcification, aortic atherosclerosis, HLD: No symptoms suggestive of angina or cardiac decompensation.  LDL 78 in 09/2021.  Obtain CMP and lipid panel.  Remains on atorvastatin 40 mg and aspirin 81 mg daily.  He would require diagnostic R/LHC prior to potentially undergoing repair of aortic root/aortic valve should there be progression of pathology.  Tobacco use: Continues to smoke approximately 1 pack/day.  Complete cessation is  encouraged.  Declines assistance at this time.     Disposition: F/u with Dr. Kirke Corin or an APP in 12 months.   Medication Adjustments/Labs and Tests Ordered: Current medicines are reviewed at length with the patient today.  Concerns regarding medicines are outlined above. Medication changes, Labs and Tests ordered today are summarized above and listed in the Patient Instructions accessible in Encounters.   Signed, Eula Listen, PA-C 06/29/2023 3:41 PM     [facility] [place] [address] [place], [place] [address] [phone]

## 2023-06-29 NOTE — Patient Instructions (Addendum)
Medication Instructions:  Your Physician recommend you continue on your current medication as directed.    *If you need a refill on your cardiac medications before your next appointment, please call your pharmacy*   Lab Work: Your provider would like for you to have following labs drawn today CMET, Lipid Panel, and CBC.   If you have labs (blood work) drawn today and your tests are completely normal, you will receive your results only by: MyChart Message (if you have MyChart) OR A paper copy in the mail If you have any lab test that is abnormal or we need to change your treatment, we will call you to review the results.  CT Angiography (CTA) chest/aorta, is a special type of CT scan that uses a computer to produce multi-dimensional views of major blood vessels throughout the body. In CT angiography, a contrast material is injected through an IV to help visualize the blood vessels  Nothing to eat or drink 4 hours prior to test  [facility] [address]  [place], [place] [address]   Follow-Up: At [facility], you and your health needs are our priority.  As part of our continuing mission to provide you with exceptional heart care, we have created designated Provider Care Teams.  These Care Teams include your primary Cardiologist (physician) and Advanced Practice Providers (APPs -  Physician Assistants and Nurse Practitioners) who all work together to provide you with the care you need, when you need it.  We recommend signing up for the patient portal called "MyChart".  Sign up information is provided on this After Visit Summary.  MyChart is used to connect with patients for Virtual Visits (Telemedicine).  Patients are able to view lab/test results, encounter notes, upcoming appointments, etc.  Non-urgent messages can be sent to your provider as well.   To learn more about what you can do with MyChart, go to [email or web address].    Your  next appointment:   12 month(s)  Provider:   You may see Lorine Bears, MD or one of the following Advanced Practice Providers on your designated Care Team: Eula Listen, PA-C  Other Instructions Your physician has requested that you have cardiac CT. Cardiac computed tomography (CT) is a painless test that uses an x-ray machine to take clear, detailed pictures of your heart.

## 2023-06-30 LAB — COMPREHENSIVE METABOLIC PANEL
ALT: 28 IU/L (ref 0–44)
AST: 19 IU/L (ref 0–40)
Albumin: 4.6 g/dL (ref 3.9–4.9)
Alkaline Phosphatase: 61 IU/L (ref 44–121)
BUN/Creatinine Ratio: 17 (ref 10–24)
BUN: 16 mg/dL (ref 8–27)
Bilirubin Total: 0.9 mg/dL (ref 0.0–1.2)
CO2: 21 mmol/L (ref 20–29)
Calcium: 9.1 mg/dL (ref 8.6–10.2)
Chloride: 102 mmol/L (ref 96–106)
Creatinine, Ser: 0.93 mg/dL (ref 0.76–1.27)
Globulin, Total: 2.1 g/dL (ref 1.5–4.5)
Glucose: 108 mg/dL — ABNORMAL HIGH (ref 70–99)
Potassium: 4.8 mmol/L (ref 3.5–5.2)
Sodium: 139 mmol/L (ref 134–144)
Total Protein: 6.7 g/dL (ref 6.0–8.5)
eGFR: 91 mL/min/{1.73_m2} (ref >59)

## 2023-06-30 LAB — CBC
Hematocrit: 39.7 % (ref 37.5–51.0)
Hemoglobin: 12.9 g/dL — ABNORMAL LOW (ref 13.0–17.7)
MCH: 32.4 pg (ref 26.6–33.0)
MCHC: 32.5 g/dL (ref 31.5–35.7)
MCV: 100 fL — ABNORMAL HIGH (ref 79–97)
Platelets: 172 10*3/uL (ref 150–450)
RBC: 3.98 x10E6/uL — ABNORMAL LOW (ref 4.14–5.80)
RDW: 11.9 % (ref 11.6–15.4)
WBC: 5.4 10*3/uL (ref 3.4–10.8)

## 2023-06-30 LAB — LIPID PANEL
Chol/HDL Ratio: 2.5 ratio (ref 0.0–5.0)
Cholesterol, Total: 127 mg/dL (ref 100–199)
HDL: 51 mg/dL (ref >39)
LDL Chol Calc (NIH): 60 mg/dL (ref 0–99)
Triglycerides: 83 mg/dL (ref 0–149)
VLDL Cholesterol Cal: 16 mg/dL (ref 5–40)

## 2023-07-17 ENCOUNTER — Telehealth: Payer: Self-pay | Admitting: Cardiovascular Disease

## 2023-07-17 NOTE — Telephone Encounter (Signed)
error 

## 2023-11-10 ENCOUNTER — Other Ambulatory Visit: Payer: Self-pay | Admitting: Physician Assistant

## 2023-11-15 ENCOUNTER — Other Ambulatory Visit: Payer: Self-pay | Admitting: Physician Assistant

## 2024-01-08 ENCOUNTER — Other Ambulatory Visit: Payer: Self-pay | Admitting: Physician Assistant

## 2024-01-26 ENCOUNTER — Telehealth: Payer: Self-pay | Admitting: Cardiovascular Disease

## 2024-01-26 ENCOUNTER — Telehealth: Payer: Self-pay | Admitting: Pharmacy Technician

## 2024-01-26 ENCOUNTER — Other Ambulatory Visit: Payer: Self-pay

## 2024-01-26 ENCOUNTER — Other Ambulatory Visit (HOSPITAL_COMMUNITY): Payer: Self-pay

## 2024-01-26 MED ORDER — LISINOPRIL 40 MG PO TABS: 40.0000 mg | ORAL_TABLET | Freq: Every day | ORAL | 3 refills | Status: AC

## 2024-01-26 NOTE — Telephone Encounter (Signed)
 Ran test claim for lisinopril 40mg . For a 90 day supply and the co-pay is 0.00 . PA is not needed at this time. Nothing saying this is a transition fill. This test claim was processed through [facility]- copay amounts may vary at other pharmacies due to pharmacy/plan contracts, or as the patient moves through the different stages of their insurance plan.

## 2024-01-26 NOTE — Telephone Encounter (Signed)
 RX sent to pharmacy

## 2024-01-26 NOTE — Telephone Encounter (Signed)
 Pt c/o medication issue:  1. Name of Medication:   lisinopril (ZESTRIL) 40 MG tablet    2. How are you currently taking this medication (dosage and times per day)?  Take 1 tablet (40 mg total) by mouth daily.      3. Are you having a reaction (difficulty breathing--STAT)? No  4. What is your medication issue? Pt stated this medication requires a PA before he can get it at the [facility] pharmacy. Please advise

## 2024-02-03 ENCOUNTER — Ambulatory Visit
Admission: RE | Admit: 2024-02-03 | Discharge: 2024-02-03 | Disposition: A | Discharge status: Home / Self Care | Payer: Self-pay | Source: Ambulatory Visit | Attending: Physician Assistant | Admitting: Physician Assistant

## 2024-02-03 DIAGNOSIS — I2510 Atherosclerotic heart disease of native coronary artery without angina pectoris: Secondary | ICD-10-CM | POA: Diagnosis not present

## 2024-02-03 DIAGNOSIS — I700 Atherosclerosis of aorta: Secondary | ICD-10-CM | POA: Diagnosis not present

## 2024-02-03 DIAGNOSIS — I7121 Aneurysm of the ascending aorta, without rupture: Secondary | ICD-10-CM | POA: Diagnosis not present

## 2024-02-03 DIAGNOSIS — I77810 Thoracic aortic ectasia: Secondary | ICD-10-CM | POA: Insufficient documentation

## 2024-02-03 MED ORDER — IOHEXOL 350 MG/ML SOLN
100.0000 mL | Freq: Once | INTRAVENOUS | Status: AC | PRN
  Administered 2024-02-03: 100 mL via INTRAVENOUS

## 2024-02-10 ENCOUNTER — Other Ambulatory Visit: Payer: Self-pay | Admitting: Emergency Medicine

## 2024-02-10 DIAGNOSIS — I350 Nonrheumatic aortic (valve) stenosis: Secondary | ICD-10-CM

## 2024-04-06 DIAGNOSIS — M5416 Radiculopathy, lumbar region: Secondary | ICD-10-CM | POA: Diagnosis not present

## 2024-04-06 DIAGNOSIS — M5441 Lumbago with sciatica, right side: Secondary | ICD-10-CM | POA: Diagnosis not present

## 2024-04-06 DIAGNOSIS — G8929 Other chronic pain: Secondary | ICD-10-CM | POA: Diagnosis not present

## 2024-05-22 ENCOUNTER — Other Ambulatory Visit: Payer: Self-pay | Admitting: Physician Assistant

## 2024-05-30 ENCOUNTER — Other Ambulatory Visit: Payer: Self-pay | Admitting: Emergency Medicine

## 2024-05-30 ENCOUNTER — Ambulatory Visit: Attending: Physician Assistant

## 2024-05-30 DIAGNOSIS — I350 Nonrheumatic aortic (valve) stenosis: Secondary | ICD-10-CM | POA: Diagnosis not present

## 2024-05-30 DIAGNOSIS — I714 Abdominal aortic aneurysm, without rupture, unspecified: Secondary | ICD-10-CM

## 2024-05-30 LAB — ECHOCARDIOGRAM COMPLETE
AR max vel: 0.85 cm2
AV Area VTI: 0.88 cm2
AV Area mean vel: 0.86 cm2
AV Mean grad: 34 mmHg
AV Peak grad: 62.4 mmHg
Ao pk vel: 3.95 m/s
Area-P 1/2: 5.38 cm2
S' Lateral: 3.49 cm

## 2024-06-01 ENCOUNTER — Ambulatory Visit: Payer: Self-pay | Admitting: Physician Assistant

## 2024-06-17 ENCOUNTER — Ambulatory Visit: Attending: Cardiovascular Disease

## 2024-06-17 DIAGNOSIS — I7141 Pararenal abdominal aortic aneurysm, without rupture: Secondary | ICD-10-CM | POA: Diagnosis not present

## 2024-06-17 DIAGNOSIS — I714 Abdominal aortic aneurysm, without rupture, unspecified: Secondary | ICD-10-CM

## 2024-06-21 ENCOUNTER — Ambulatory Visit: Payer: Self-pay | Admitting: Cardiovascular Disease

## 2024-06-21 DIAGNOSIS — I714 Abdominal aortic aneurysm, without rupture, unspecified: Secondary | ICD-10-CM

## 2024-06-22 NOTE — Progress Notes (Unsigned)
 Cardiology Office Note    Date:  06/23/2024   ID:  Chase [name], DOB 1955-10-21, MRN [id number]  PCP:  [name], MD  Cardiologist:  [name], MD  Electrophysiologist:  None   Chief Complaint: Follow-up  History of Present Illness:   Chase [name] is a 68 y.o. 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  Prior ETT in 05/2015, for atypical chest pain, showed no significant EKG changes concerning for ischemia with a hypertensive response to exercise.  He has been monitored periodically with echoes for aortic stenosis with echo from 04/2021 demonstrating an EF of 60 to 65%, no regional wall motion abnormalities, grade 1 diastolic dysfunction, normal RV systolic function and ventricular cavity size, severe calcification of the aortic valve with moderate stenosis with a mean gradient of 24.5 mmHg and valve area of 1.17 cm, borderline dilatation of the aortic root and ascending aorta measuring 38 mm and 39 mm, respectively.  He underwent CTA of the chest/aorta, for previously noted mildly dilated aortic root in 05/2019, which demonstrated the ascending aorta was 3.9 cm.  He was seen in the office in 09/2021 and was working on acquiring Medicare with plans to pursue TEE once this was completed.  He underwent MRI of the lumbar spine in 03/2022, which demonstrated an abdominal aorta measuring up to 3.3 cm, though was suboptimally evaluated.  AAA ultrasound from 06/05/2022 showed a stable 3.3 cm AAA when compared to study from 03/2018.  Echo from 05/2022 demonstrated an EF of 60 to 65%, no regional  wall motion abnormalities, moderate LVH, grade 2 diastolic dysfunction, normal RV systolic function and ventricular cavity size, mildly dilated left atrium, indeterminate number of cusps on the aortic valve with moderate calcification and moderate aortic valve stenosis with a mean gradient of 23 mmHg and a valve area by VTI of 1.13 cm, and normal size and structure aortic root.  Echo from 05/2023 showed an EF of 60 to 65%, no regional wall motion abnormalities, moderate LVH of the basal septal segment, normal LV diastolic function parameters, normal RV systolic function with mildly enlarged ventricular cavity size, moderate aortic stenosis with a mean gradient of 25 mmHg, mild dilatation of the aortic root measuring 44 mm and mild dilatation of the ascending aorta measuring 41 mm, and an estimated right atrial pressure of 3 mmHg.  Abdominal aortic ultrasound from 05/2023 showed the largest aortic measurement of 2.6 cm (prior 3.3 cm), with the supraceliac portion of the aorta not visualized due to overlying bowel gas.    He was last seen in the office in 06/2023 he was doing well from a cardiac perspective.  CTA aorta in 01/2024 showed 4.1 cm ascending thoracic aorta.  He did continue to smoke 1/2 pack of cigarettes per day.  Most recent echo from 05/2024 showed an EF of 55 to 60%, no regional wall motion abnormalities, mild LVH, normal LV diastolic function parameters, normal RV systolic function and ventricular cavity size, calcified aortic valve with mild insufficiency and severe aortic stenosis with a mean gradient of 34 mmHg and a valve area of 0.88 cm.  AAA ultrasound from  05/2024 showed a stable AAA measuring 3.2 cm.  He comes in today accompanied by his wife and is without symptoms of angina or cardiac decompensation.  He does note some randomly occurring chest discomfort that will last 1 to 2 seconds and spontaneously resolved.  He also notes some random lingering shortness of breath that is also several  seconds in duration.  No dizziness, presyncope, or syncope.  He notes an increase in bilateral lower extremity swelling following the discontinuation of compression socks.  Continues to smoke a little under 1 pack/day.  No progressive orthopnea, falls, or symptoms concerning for bleeding.   Labs independently reviewed: 06/2023 - Hgb 12.9, PLT 172, TC 127, TG 83, HDL 51, LDL 60, BUN 16, serum creatinine 0.93, potassium 4.8, BUN 4.6, AST/ALT normal 05/2019 - TSH normal   Past Medical History:  Diagnosis Date   Arthritis    maybe - hands   Chest pain    a. ETT 06/14/2015: no st segment or T-waves changes during stress, mildly reduced exercise capacity, hypertensive response to exercise   Chicken pox    Chronic venous insufficiency    a. improved with support hose.   Erectile dysfunction    Essential hypertension    CONTROLLED ON MEDS   History of echocardiogram    a. 03/2016 Echo: EF 55-60%, no rwma, mild AS, triv AI, mildly dil Ao.   HLD (hyperlipidemia)    Measles    Mumps    Polysubstance abuse (HCC)    a. ongoing tobacco and alcohol abuse   Wears dentures    full upper    Past Surgical History:  Procedure Laterality Date   APPENDECTOMY  1972   COLONOSCOPY WITH PROPOFOL  N/A 08/27/2015   Procedure: COLONOSCOPY WITH PROPOFOL ;  Surgeon: Rogelia Copping, MD;  Location: [facility];  Service: Endoscopy;  Laterality: N/A;   POLYPECTOMY  08/27/2015   Procedure: POLYPECTOMY;  Surgeon: Rogelia Copping, MD;  Location: [facility];  Service: Endoscopy;;    Current Medications: Current Meds  Medication Sig   amLODipine  (NORVASC ) 10 MG tablet Take 1 tablet (10 mg total) by mouth daily. NEED OV.   aspirin 81 MG tablet Take 81 mg by mouth daily. AM   atorvastatin  (LIPITOR) 40 MG tablet TAKE 1 TABLET(40 MG) BY MOUTH DAILY   carvedilol  (COREG ) 3.125 MG tablet TAKE 1 TABLET(3.125 MG) BY MOUTH TWICE DAILY   CINNAMON PO Take by mouth. AM   gabapentin (NEURONTIN) 100 MG capsule Take 100  mg by mouth 2 (two) times daily.   gabapentin (NEURONTIN) 300 MG capsule Take 300 mg by mouth at bedtime.   lisinopril  (ZESTRIL ) 40 MG tablet Take 1 tablet (40 mg total) by mouth daily.   Multiple Vitamin (MULTIVITAMIN) capsule Take 1 capsule by mouth daily. AM   Omega-3 Fatty Acids (FISH OIL) 1000 MG CAPS Take 1 capsule by mouth.   omeprazole (PRILOSEC OTC) 20 MG tablet Take 20 mg by mouth daily.    Allergies:   Patient has no known allergies.   Social History   Socioeconomic History   Marital status: Married    Spouse name: Not on file   Number of children: Not on file   Years of education: Not on file   Highest education level: Not on file  Occupational History   Occupation: Full-time    Comment: Works in Production designer, theatre/television/film at [employer]  Tobacco Use   Smoking status: Some Days    Current packs/day: 1.50    Average packs/day: 1.5  packs/day for 54.2 years (81.4 ttl pk-yrs)    Types: Cigarettes    Start date: 03/27/1970   Smokeless tobacco: Never   Tobacco comments:    Smokes 1.5-2 packs per day   Substance and Sexual Activity   Alcohol use: Yes    Alcohol/week: 30.0 standard drinks of alcohol    Types: 30 Cans of beer per week    Comment: drinks 4 beers daily   Drug use: No   Sexual activity: Not on file  Other Topics Concern   Not on file  Social History Narrative   Not on file   Social Drivers of Health   Financial Resource Strain: Not on file  Food Insecurity: Not on file  Transportation Needs: Not on file  Physical Activity: Not on file  Stress: Not on file  Social Connections: Not on file     Family History:  The patient's family history includes Heart disease in his mother; Pancreatic cancer in his father.  ROS:   12-point review of systems is negative unless otherwise noted in the HPI.   EKGs/Labs/Other Studies Reviewed:    Studies reviewed were summarized above. The additional studies were reviewed today:  AAA ultrasound 06/17/2024: Summary:   Abdominal Aorta: There is evidence of abnormal dilatation of the proximal  Abdominal aorta. The largest aortic measurement is 3.2 cm. Previous  diameter measurement on 05/28/23 was 2.6 cm (supraceliac portion not  visualized due to bowel gas); previous  diameter measurement on 06/05/22 was 3.3cm.    Suggest follow up study in 12 months.  __________  2D echo 05/30/2024: 1. Left ventricular ejection fraction, by estimation, is 55 to 60%. The  left ventricle has normal function. The left ventricle has no regional  wall motion abnormalities. There is mild left ventricular hypertrophy.  Left ventricular diastolic parameters  were normal.   2. Right ventricular systolic function is normal. The right ventricular  size is normal.   3. The mitral valve is normal in structure. No evidence of mitral valve  regurgitation.   4. Aortic valve DVI 0.21. The aortic valve is calcified. Aortic valve  regurgitation is mild. Severe aortic valve stenosis. Aortic valve area, by  VTI measures 0.88 cm. Aortic valve mean gradient measures 34.0 mmHg.  Aortic valve Vmax measures 3.95 m/s.   5. The inferior vena cava is normal in size with greater than 50%  respiratory variability, suggesting right atrial pressure of 3 mmHg.  __________  Abdominal aortic ultrasound 05/26/2023: Summary:  Abdominal Aorta: The largest aortic measurement is 2.6 cm. The supraceliac  portion of the aorta was not seen due to overlying bowel gas. Previous  diameter measurement was 3.3 cm obtained on 06/05/22.  __________   2D echo 05/26/2023: 1. Left ventricular ejection fraction, by estimation, is 60 to 65%. The  left ventricle has normal function. The left ventricle has no regional  wall motion abnormalities. There is moderate left ventricular hypertrophy  of the basal-septal segment. Left  ventricular diastolic parameters were normal.   2. Right ventricular systolic function is normal. The right ventricular  size is mildly  enlarged.   3. The mitral valve is normal in structure. No evidence of mitral valve  regurgitation.   4. Mean AV gradient , PG , Vmax 3.5m/s. The aortic valve is  calcified. Aortic valve regurgitation is not visualized. Moderate aortic  valve stenosis.   5. Aortic dilatation noted. There is mild dilatation of the aortic root,  measuring 44 mm. There is mild dilatation of  the ascending aorta,  measuring 41 mm.   6. The inferior vena cava is normal in size with greater than 50%  respiratory variability, suggesting right atrial pressure of 3 mmHg.  __________   2D echo 06/05/2022: 1. Left ventricular ejection fraction, by estimation, is 60 to 65%. The  left ventricle has normal function. The left ventricle has no regional  wall motion abnormalities. There is moderate left ventricular hypertrophy.  Left ventricular diastolic  parameters are consistent with Grade II diastolic dysfunction  (pseudonormalization). The average left ventricular global longitudinal  strain is -19.4 %.   2. Right ventricular systolic function is normal. The right ventricular  size is normal.   3. Left atrial size was mildly dilated.   4. The mitral valve is normal in structure. No evidence of mitral valve  regurgitation. No evidence of mitral stenosis.   5. The aortic valve has an indeterminant number of cusps. There is  moderate calcification of the aortic valve. Aortic valve regurgitation is  not visualized. Moderate aortic valve stenosis. Aortic valve area, by VTI  measures 1.13 cm. Aortic valve mean  gradient measures 23.0 mmHg. Aortic valve Vmax measures 3.56 m/s.   6. The inferior vena cava is normal in size with greater than 50%  respiratory variability, suggesting right atrial pressure of 3 mmHg.   Comparison(s): Previous AV PG's 48 mmHg max, 25 mmHg mean  __________   AAA ultrasound 06/05/2022: Summary:  Abdominal Aorta: There is evidence of abnormal dilatation of the proximal   Abdominal aorta. The largest aortic measurement is 3.3 cm. The largest  aortic diameter remains essentially unchanged compared to prior exam.  Previous diameter measurement was 3.3  cm obtained on 03/22/18.  __________   2D echo 04/30/2021: 1. Left ventricular ejection fraction, by estimation, is 60 to 65%. The  left ventricle has normal function. The left ventricle has no regional  wall motion abnormalities. Left ventricular diastolic parameters are  consistent with Grade I diastolic  dysfunction (impaired relaxation).   2. Right ventricular systolic function is normal. The right ventricular  size is normal.   3. The mitral valve is normal in structure. No evidence of mitral valve  regurgitation. No evidence of mitral stenosis.   4. The aortic valve is normal in structure. There is severe calcifcation  of the aortic valve. Aortic valve regurgitation is not visualized.  Moderate aortic valve stenosis. Aortic valve area, by VTI measures 1.17  cm. Aortic valve mean gradient measures  24.5 mmHg. Aortic valve Vmax measures 3.46 m/s.   5. There is borderline dilatation of the ascending aorta, measuring 39  mm. There is borderline dilatation of the aortic root, measuring 38 mm. __________   2D echo 05/13/2019: 1. The left ventricle has normal systolic function, with an ejection  fraction of 55-60%. The cavity size was normal. There is mildly increased  left ventricular wall thickness of the septal wall. Left ventricular  diastolic Doppler parameters are  consistent with pseudonormalization.   2. The right ventricle has normal systolic function. The cavity was  normal. There is no increase in right ventricular wall thickness. Unable  to estimate RVSP.   3. Moderate stenosis of the aortic valve. Mean gradient of 24 mm Hg, peak  gradient of 44 mm Hg, estimated AVA 1.3 cmsq   4. There is dilatation of the aortic root 4.5 cm and of the ascending  aorta 4.2 cm. __________   2D echo  04/09/2016: - Left ventricle: The cavity size was normal. Wall  thickness was    increased in a pattern of mild LVH. Systolic function was normal.    The estimated ejection fraction was in the range of 55% to 60%.    Wall motion was normal; there were no regional wall motion    abnormalities.  - Aortic valve: Calcified annulus. Moderately thickened leaflets.    At the most, there was very mild stenosis. There was trivial    regurgitation. Valve area (VTI): 1.86 cm^2. Valve area (Vmax):    1.74 cm^2. Valve area (Vmean): 1.69 cm^2.  - Aorta: The aorta was mildly dilated. __________   ETT 06/14/2015: There was no ST segment deviation noted during stress. No T wave inversion was noted during stress.   Normal treadmill stress test. Mildly reduced exercise capacity with hypertensive response to exercise.   EKG:  EKG is ordered today.  The EKG ordered today demonstrates NSR, 72 bpm, no acute ST-T changes  Recent Labs: 06/29/2023: ALT 28; BUN 16; Creatinine, Ser 0.93; Hemoglobin 12.9; Platelets 172; Potassium 4.8; Sodium 139  Recent Lipid Panel    Component Value Date/Time   CHOL 127 06/29/2023 0858   TRIG 83 06/29/2023 0858   HDL 51 06/29/2023 0858   CHOLHDL 2.5 06/29/2023 0858   CHOLHDL 2.6 09/27/2021 0959   VLDL 14 09/27/2021 0959   LDLCALC 60 06/29/2023 0858    PHYSICAL EXAM:    VS:  BP 119/70 (BP Location: Left Arm, Patient Position: Sitting, Cuff Size: Normal)   Pulse 72   Ht 6' 1 (1.854 m)   Wt 226 lb 12.8 oz (102.9 kg)   SpO2 98%   BMI 29.92 kg/m   BMI: Body mass index is 29.92 kg/m.  Physical Exam Vitals reviewed.  Constitutional:      Appearance: He is well-developed.  HENT:     Head: Normocephalic and atraumatic.  Eyes:     General:        Right eye: No discharge.        Left eye: No discharge.  Cardiovascular:     Rate and Rhythm: Normal rate and regular rhythm.     Heart sounds: S1 normal and S2 normal. Heart sounds not distant. No midsystolic click and no  opening snap. Murmur heard.     Harsh midsystolic murmur is present with a grade of 3/6 at the upper right sternal border radiating to the neck.     No friction rub.  Pulmonary:     Effort: Pulmonary effort is normal. No respiratory distress.     Breath sounds: Normal breath sounds. No decreased breath sounds, wheezing, rhonchi or rales.  Musculoskeletal:     Cervical back: Normal range of motion.     Comments: Mild bilateral lower extremity edema.  Skin:    General: Skin is warm and dry.     Nails: There is no clubbing.  Neurological:     Mental Status: He is alert and oriented to person, place, and time.  Psychiatric:        Speech: Speech normal.        Behavior: Behavior normal.        Thought Content: Thought content normal.        Judgment: Judgment normal.     Wt Readings from Last 3 Encounters:  06/23/24 226 lb 12.8 oz (102.9 kg)  06/29/23 226 lb 9.6 oz (102.8 kg)  11/04/22 229 lb 6.4 oz (104.1 kg)     ASSESSMENT & PLAN:   Aortic valve stenosis: Echo obtained last month  showed progression of aortic valve stenosis, now severe.  Schedule diagnostic R/LHC with recommendation to refer to structural heart team versus CVTS thereafter based on findings.  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  Normal size and structure of aorta documented on echo last month.  Anticipate CTA aorta in 01/2025.  Optimal blood pressure control recommended.  AAA: Stable by ultrasound in 05/2024 measuring 3.2 cm.  Follow-up ultrasound in 1 year.  Coronary artery calcification/aortic atherosclerosis/HLD: Plan for [facility] as outlined above.  LDL 60 in 06/2023.  Remains on aspirin 81 mg and atorvastatin  40 mg.  Aggressive risk factor modification including complete smoking cessation recommended.  Obtain LFT and lipid panel.  HTN: Blood pressure is well-controlled in the office,  currently on carvedilol  3.125 mg twice daily and lisinopril  40 mg.  Update labs.  Tobacco use: Continues to smoke a little under 1 pack/day.  Complete cessation is encouraged.    Informed Consent   Shared Decision Making/Informed Consent{  The risks [stroke (1 in 1000), death (1 in 1000), kidney failure [usually temporary] (1 in 500), bleeding (1 in 200), allergic reaction [possibly serious] (1 in 200)], benefits (diagnostic support and management of coronary artery disease) and alternatives of a cardiac catheterization were discussed in detail with Mr. Deckman and he is willing to proceed.         Disposition: F/u with Dr. Darron or an APP in 1-2 weeks after [facility].   Medication Adjustments/Labs and Tests Ordered: Current medicines are reviewed at length with the patient today.  Concerns regarding medicines are outlined above. Medication changes, Labs and Tests ordered today are summarized above and listed in the Patient Instructions accessible in Encounters.   Signed, Bernardino Bring, PA-C 06/23/2024 12:48 PM     [facility] [place] [address] [place], [place] [address] [phone]

## 2024-06-22 NOTE — H&P (View-Only) (Signed)
 Cardiology Office Note    Date:  06/23/2024   ID:  Chase [name], DOB 1956-01-11, MRN [id number]  PCP:  [name], MD  Cardiologist:  [name], MD  Electrophysiologist:  None   Chief Complaint: Follow-up  History of Present Illness:   Chase [name] is a 68 y.o. 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  Prior ETT in 05/2015, for atypical chest pain, showed no significant EKG changes concerning for ischemia with a hypertensive response to exercise.  He has been monitored periodically with echoes for aortic stenosis with echo from 04/2021 demonstrating an EF of 60 to 65%, no regional wall motion abnormalities, grade 1 diastolic dysfunction, normal RV systolic function and ventricular cavity size, severe calcification of the aortic valve with moderate stenosis with a mean gradient of 24.5 mmHg and valve area of 1.17 cm, borderline dilatation of the aortic root and ascending aorta measuring 38 mm and 39 mm, respectively.  He underwent CTA of the chest/aorta, for previously noted mildly dilated aortic root in 05/2019, which demonstrated the ascending aorta was 3.9 cm.  He was seen in the office in 09/2021 and was working on acquiring Medicare with plans to pursue TEE once this was completed.  He underwent MRI of the lumbar spine in 03/2022, which demonstrated an abdominal aorta measuring up to 3.3 cm, though was suboptimally evaluated.  AAA ultrasound from 06/05/2022 showed a stable 3.3 cm AAA when compared to study from 03/2018.  Echo from 05/2022 demonstrated an EF of 60 to 65%, no regional  wall motion abnormalities, moderate LVH, grade 2 diastolic dysfunction, normal RV systolic function and ventricular cavity size, mildly dilated left atrium, indeterminate number of cusps on the aortic valve with moderate calcification and moderate aortic valve stenosis with a mean gradient of 23 mmHg and a valve area by VTI of 1.13 cm, and normal size and structure aortic root.  Echo from 05/2023 showed an EF of 60 to 65%, no regional wall motion abnormalities, moderate LVH of the basal septal segment, normal LV diastolic function parameters, normal RV systolic function with mildly enlarged ventricular cavity size, moderate aortic stenosis with a mean gradient of 25 mmHg, mild dilatation of the aortic root measuring 44 mm and mild dilatation of the ascending aorta measuring 41 mm, and an estimated right atrial pressure of 3 mmHg.  Abdominal aortic ultrasound from 05/2023 showed the largest aortic measurement of 2.6 cm (prior 3.3 cm), with the supraceliac portion of the aorta not visualized due to overlying bowel gas.    He was last seen in the office in 06/2023 he was doing well from a cardiac perspective.  CTA aorta in 01/2024 showed 4.1 cm ascending thoracic aorta.  He did continue to smoke 1/2 pack of cigarettes per day.  Most recent echo from 05/2024 showed an EF of 55 to 60%, no regional wall motion abnormalities, mild LVH, normal LV diastolic function parameters, normal RV systolic function and ventricular cavity size, calcified aortic valve with mild insufficiency and severe aortic stenosis with a mean gradient of 34 mmHg and a valve area of 0.88 cm.  AAA ultrasound from  05/2024 showed a stable AAA measuring 3.2 cm.  He comes in today accompanied by his wife and is without symptoms of angina or cardiac decompensation.  He does note some randomly occurring chest discomfort that will last 1 to 2 seconds and spontaneously resolved.  He also notes some random lingering shortness of breath that is also several  seconds in duration.  No dizziness, presyncope, or syncope.  He notes an increase in bilateral lower extremity swelling following the discontinuation of compression socks.  Continues to smoke a little under 1 pack/day.  No progressive orthopnea, falls, or symptoms concerning for bleeding.   Labs independently reviewed: 06/2023 - Hgb 12.9, PLT 172, TC 127, TG 83, HDL 51, LDL 60, BUN 16, serum creatinine 0.93, potassium 4.8, BUN 4.6, AST/ALT normal 05/2019 - TSH normal   Past Medical History:  Diagnosis Date   Arthritis    maybe - hands   Chest pain    a. ETT 06/14/2015: no st segment or T-waves changes during stress, mildly reduced exercise capacity, hypertensive response to exercise   Chicken pox    Chronic venous insufficiency    a. improved with support hose.   Erectile dysfunction    Essential hypertension    CONTROLLED ON MEDS   History of echocardiogram    a. 03/2016 Echo: EF 55-60%, no rwma, mild AS, triv AI, mildly dil Ao.   HLD (hyperlipidemia)    Measles    Mumps    Polysubstance abuse (HCC)    a. ongoing tobacco and alcohol abuse   Wears dentures    full upper    Past Surgical History:  Procedure Laterality Date   APPENDECTOMY  1972   COLONOSCOPY WITH PROPOFOL  N/A 08/27/2015   Procedure: COLONOSCOPY WITH PROPOFOL ;  Surgeon: Rogelia Copping, MD;  Location: [facility];  Service: Endoscopy;  Laterality: N/A;   POLYPECTOMY  08/27/2015   Procedure: POLYPECTOMY;  Surgeon: Rogelia Copping, MD;  Location: [facility];  Service: Endoscopy;;    Current Medications: Current Meds  Medication Sig   amLODipine  (NORVASC ) 10 MG tablet Take 1 tablet (10 mg total) by mouth daily. NEED OV.   aspirin  81 MG tablet Take 81 mg by mouth daily. AM   atorvastatin  (LIPITOR) 40 MG tablet TAKE 1 TABLET(40 MG) BY MOUTH DAILY   carvedilol  (COREG ) 3.125 MG tablet TAKE 1 TABLET(3.125 MG) BY MOUTH TWICE DAILY   CINNAMON PO Take by mouth. AM   gabapentin (NEURONTIN) 100 MG capsule Take 100  mg by mouth 2 (two) times daily.   gabapentin (NEURONTIN) 300 MG capsule Take 300 mg by mouth at bedtime.   lisinopril  (ZESTRIL ) 40 MG tablet Take 1 tablet (40 mg total) by mouth daily.   Multiple Vitamin (MULTIVITAMIN) capsule Take 1 capsule by mouth daily. AM   Omega-3 Fatty Acids (FISH OIL) 1000 MG CAPS Take 1 capsule by mouth.   omeprazole (PRILOSEC OTC) 20 MG tablet Take 20 mg by mouth daily.    Allergies:   Patient has no known allergies.   Social History   Socioeconomic History   Marital status: Married    Spouse name: Not on file   Number of children: Not on file   Years of education: Not on file   Highest education level: Not on file  Occupational History   Occupation: Full-time    Comment: Works in Production designer, theatre/television/film at [employer]  Tobacco Use   Smoking status: Some Days    Current packs/day: 1.50    Average packs/day: 1.5  packs/day for 54.2 years (81.4 ttl pk-yrs)    Types: Cigarettes    Start date: 03/27/1970   Smokeless tobacco: Never   Tobacco comments:    Smokes 1.5-2 packs per day   Substance and Sexual Activity   Alcohol use: Yes    Alcohol/week: 30.0 standard drinks of alcohol    Types: 30 Cans of beer per week    Comment: drinks 4 beers daily   Drug use: No   Sexual activity: Not on file  Other Topics Concern   Not on file  Social History Narrative   Not on file   Social Drivers of Health   Financial Resource Strain: Not on file  Food Insecurity: Not on file  Transportation Needs: Not on file  Physical Activity: Not on file  Stress: Not on file  Social Connections: Not on file     Family History:  The patient's family history includes Heart disease in his mother; Pancreatic cancer in his father.  ROS:   12-point review of systems is negative unless otherwise noted in the HPI.   EKGs/Labs/Other Studies Reviewed:    Studies reviewed were summarized above. The additional studies were reviewed today:  AAA ultrasound 06/17/2024: Summary:   Abdominal Aorta: There is evidence of abnormal dilatation of the proximal  Abdominal aorta. The largest aortic measurement is 3.2 cm. Previous  diameter measurement on 05/28/23 was 2.6 cm (supraceliac portion not  visualized due to bowel gas); previous  diameter measurement on 06/05/22 was 3.3cm.    Suggest follow up study in 12 months.  __________  2D echo 05/30/2024: 1. Left ventricular ejection fraction, by estimation, is 55 to 60%. The  left ventricle has normal function. The left ventricle has no regional  wall motion abnormalities. There is mild left ventricular hypertrophy.  Left ventricular diastolic parameters  were normal.   2. Right ventricular systolic function is normal. The right ventricular  size is normal.   3. The mitral valve is normal in structure. No evidence of mitral valve  regurgitation.   4. Aortic valve DVI 0.21. The aortic valve is calcified. Aortic valve  regurgitation is mild. Severe aortic valve stenosis. Aortic valve area, by  VTI measures 0.88 cm. Aortic valve mean gradient measures 34.0 mmHg.  Aortic valve Vmax measures 3.95 m/s.   5. The inferior vena cava is normal in size with greater than 50%  respiratory variability, suggesting right atrial pressure of 3 mmHg.  __________  Abdominal aortic ultrasound 05/26/2023: Summary:  Abdominal Aorta: The largest aortic measurement is 2.6 cm. The supraceliac  portion of the aorta was not seen due to overlying bowel gas. Previous  diameter measurement was 3.3 cm obtained on 06/05/22.  __________   2D echo 05/26/2023: 1. Left ventricular ejection fraction, by estimation, is 60 to 65%. The  left ventricle has normal function. The left ventricle has no regional  wall motion abnormalities. There is moderate left ventricular hypertrophy  of the basal-septal segment. Left  ventricular diastolic parameters were normal.   2. Right ventricular systolic function is normal. The right ventricular  size is mildly  enlarged.   3. The mitral valve is normal in structure. No evidence of mitral valve  regurgitation.   4. Mean AV gradient , PG , Vmax 3.51m/s. The aortic valve is  calcified. Aortic valve regurgitation is not visualized. Moderate aortic  valve stenosis.   5. Aortic dilatation noted. There is mild dilatation of the aortic root,  measuring 44 mm. There is mild dilatation of  the ascending aorta,  measuring 41 mm.   6. The inferior vena cava is normal in size with greater than 50%  respiratory variability, suggesting right atrial pressure of 3 mmHg.  __________   2D echo 06/05/2022: 1. Left ventricular ejection fraction, by estimation, is 60 to 65%. The  left ventricle has normal function. The left ventricle has no regional  wall motion abnormalities. There is moderate left ventricular hypertrophy.  Left ventricular diastolic  parameters are consistent with Grade II diastolic dysfunction  (pseudonormalization). The average left ventricular global longitudinal  strain is -19.4 %.   2. Right ventricular systolic function is normal. The right ventricular  size is normal.   3. Left atrial size was mildly dilated.   4. The mitral valve is normal in structure. No evidence of mitral valve  regurgitation. No evidence of mitral stenosis.   5. The aortic valve has an indeterminant number of cusps. There is  moderate calcification of the aortic valve. Aortic valve regurgitation is  not visualized. Moderate aortic valve stenosis. Aortic valve area, by VTI  measures 1.13 cm. Aortic valve mean  gradient measures 23.0 mmHg. Aortic valve Vmax measures 3.56 m/s.   6. The inferior vena cava is normal in size with greater than 50%  respiratory variability, suggesting right atrial pressure of 3 mmHg.   Comparison(s): Previous AV PG's 48 mmHg max, 25 mmHg mean  __________   AAA ultrasound 06/05/2022: Summary:  Abdominal Aorta: There is evidence of abnormal dilatation of the proximal   Abdominal aorta. The largest aortic measurement is 3.3 cm. The largest  aortic diameter remains essentially unchanged compared to prior exam.  Previous diameter measurement was 3.3  cm obtained on 03/22/18.  __________   2D echo 04/30/2021: 1. Left ventricular ejection fraction, by estimation, is 60 to 65%. The  left ventricle has normal function. The left ventricle has no regional  wall motion abnormalities. Left ventricular diastolic parameters are  consistent with Grade I diastolic  dysfunction (impaired relaxation).   2. Right ventricular systolic function is normal. The right ventricular  size is normal.   3. The mitral valve is normal in structure. No evidence of mitral valve  regurgitation. No evidence of mitral stenosis.   4. The aortic valve is normal in structure. There is severe calcifcation  of the aortic valve. Aortic valve regurgitation is not visualized.  Moderate aortic valve stenosis. Aortic valve area, by VTI measures 1.17  cm. Aortic valve mean gradient measures  24.5 mmHg. Aortic valve Vmax measures 3.46 m/s.   5. There is borderline dilatation of the ascending aorta, measuring 39  mm. There is borderline dilatation of the aortic root, measuring 38 mm. __________   2D echo 05/13/2019: 1. The left ventricle has normal systolic function, with an ejection  fraction of 55-60%. The cavity size was normal. There is mildly increased  left ventricular wall thickness of the septal wall. Left ventricular  diastolic Doppler parameters are  consistent with pseudonormalization.   2. The right ventricle has normal systolic function. The cavity was  normal. There is no increase in right ventricular wall thickness. Unable  to estimate RVSP.   3. Moderate stenosis of the aortic valve. Mean gradient of 24 mm Hg, peak  gradient of 44 mm Hg, estimated AVA 1.3 cmsq   4. There is dilatation of the aortic root 4.5 cm and of the ascending  aorta 4.2 cm. __________   2D echo  04/09/2016: - Left ventricle: The cavity size was normal. Wall  thickness was    increased in a pattern of mild LVH. Systolic function was normal.    The estimated ejection fraction was in the range of 55% to 60%.    Wall motion was normal; there were no regional wall motion    abnormalities.  - Aortic valve: Calcified annulus. Moderately thickened leaflets.    At the most, there was very mild stenosis. There was trivial    regurgitation. Valve area (VTI): 1.86 cm^2. Valve area (Vmax):    1.74 cm^2. Valve area (Vmean): 1.69 cm^2.  - Aorta: The aorta was mildly dilated. __________   ETT 06/14/2015: There was no ST segment deviation noted during stress. No T wave inversion was noted during stress.   Normal treadmill stress test. Mildly reduced exercise capacity with hypertensive response to exercise.   EKG:  EKG is ordered today.  The EKG ordered today demonstrates NSR, 72 bpm, no acute ST-T changes  Recent Labs: 06/29/2023: ALT 28; BUN 16; Creatinine, Ser 0.93; Hemoglobin 12.9; Platelets 172; Potassium 4.8; Sodium 139  Recent Lipid Panel    Component Value Date/Time   CHOL 127 06/29/2023 0858   TRIG 83 06/29/2023 0858   HDL 51 06/29/2023 0858   CHOLHDL 2.5 06/29/2023 0858   CHOLHDL 2.6 09/27/2021 0959   VLDL 14 09/27/2021 0959   LDLCALC 60 06/29/2023 0858    PHYSICAL EXAM:    VS:  BP 119/70 (BP Location: Left Arm, Patient Position: Sitting, Cuff Size: Normal)   Pulse 72   Ht 6' 1 (1.854 m)   Wt 226 lb 12.8 oz (102.9 kg)   SpO2 98%   BMI 29.92 kg/m   BMI: Body mass index is 29.92 kg/m.  Physical Exam Vitals reviewed.  Constitutional:      Appearance: He is well-developed.  HENT:     Head: Normocephalic and atraumatic.  Eyes:     General:        Right eye: No discharge.        Left eye: No discharge.  Cardiovascular:     Rate and Rhythm: Normal rate and regular rhythm.     Heart sounds: S1 normal and S2 normal. Heart sounds not distant. No midsystolic click and no  opening snap. Murmur heard.     Harsh midsystolic murmur is present with a grade of 3/6 at the upper right sternal border radiating to the neck.     No friction rub.  Pulmonary:     Effort: Pulmonary effort is normal. No respiratory distress.     Breath sounds: Normal breath sounds. No decreased breath sounds, wheezing, rhonchi or rales.  Musculoskeletal:     Cervical back: Normal range of motion.     Comments: Mild bilateral lower extremity edema.  Skin:    General: Skin is warm and dry.     Nails: There is no clubbing.  Neurological:     Mental Status: He is alert and oriented to person, place, and time.  Psychiatric:        Speech: Speech normal.        Behavior: Behavior normal.        Thought Content: Thought content normal.        Judgment: Judgment normal.     Wt Readings from Last 3 Encounters:  06/23/24 226 lb 12.8 oz (102.9 kg)  06/29/23 226 lb 9.6 oz (102.8 kg)  11/04/22 229 lb 6.4 oz (104.1 kg)     ASSESSMENT & PLAN:   Aortic valve stenosis: Echo obtained last month  showed progression of aortic valve stenosis, now severe.  Schedule diagnostic R/LHC with recommendation to refer to structural heart team versus CVTS thereafter based on findings.  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  Normal size and structure of aorta documented on echo last month.  Anticipate CTA aorta in 01/2025.  Optimal blood pressure control recommended.  AAA: Stable by ultrasound in 05/2024 measuring 3.2 cm.  Follow-up ultrasound in 1 year.  Coronary artery calcification/aortic atherosclerosis/HLD: Plan for [facility] as outlined above.  LDL 60 in 06/2023.  Remains on aspirin  81 mg and atorvastatin  40 mg.  Aggressive risk factor modification including complete smoking cessation recommended.  Obtain LFT and lipid panel.  HTN: Blood pressure is well-controlled in the office,  currently on carvedilol  3.125 mg twice daily and lisinopril  40 mg.  Update labs.  Tobacco use: Continues to smoke a little under 1 pack/day.  Complete cessation is encouraged.    Informed Consent   Shared Decision Making/Informed Consent{  The risks [stroke (1 in 1000), death (1 in 1000), kidney failure [usually temporary] (1 in 500), bleeding (1 in 200), allergic reaction [possibly serious] (1 in 200)], benefits (diagnostic support and management of coronary artery disease) and alternatives of a cardiac catheterization were discussed in detail with Mr. Lizer and he is willing to proceed.         Disposition: F/u with Dr. Darron or an APP in 1-2 weeks after [facility].   Medication Adjustments/Labs and Tests Ordered: Current medicines are reviewed at length with the patient today.  Concerns regarding medicines are outlined above. Medication changes, Labs and Tests ordered today are summarized above and listed in the Patient Instructions accessible in Encounters.   Signed, Bernardino Bring, PA-C 06/23/2024 12:48 PM      [facility] [address] [place], [place] [address] [phone]

## 2024-06-23 ENCOUNTER — Ambulatory Visit: Attending: Physician Assistant | Admitting: Physician Assistant

## 2024-06-23 ENCOUNTER — Encounter: Payer: Self-pay | Admitting: Physician Assistant

## 2024-06-23 VITALS — BP 119/70 | HR 72 | Ht 73.0 in | Wt 226.8 lb

## 2024-06-23 DIAGNOSIS — I714 Abdominal aortic aneurysm, without rupture, unspecified: Secondary | ICD-10-CM

## 2024-06-23 DIAGNOSIS — Z720 Tobacco use: Secondary | ICD-10-CM | POA: Diagnosis not present

## 2024-06-23 DIAGNOSIS — I10 Essential (primary) hypertension: Secondary | ICD-10-CM | POA: Diagnosis not present

## 2024-06-23 DIAGNOSIS — Z79899 Other long term (current) drug therapy: Secondary | ICD-10-CM

## 2024-06-23 DIAGNOSIS — I350 Nonrheumatic aortic (valve) stenosis: Secondary | ICD-10-CM | POA: Diagnosis not present

## 2024-06-23 DIAGNOSIS — E785 Hyperlipidemia, unspecified: Secondary | ICD-10-CM | POA: Diagnosis not present

## 2024-06-23 DIAGNOSIS — I77810 Thoracic aortic ectasia: Secondary | ICD-10-CM

## 2024-06-23 NOTE — Patient Instructions (Signed)
 Medication Instructions:  Your physician recommends that you continue on your current medications as directed. Please refer to the Current Medication list given to you today.   *If you need a refill on your cardiac medications before your next appointment, please call your pharmacy*  Lab Work: Your provider would like for you to have following labs drawn today CMeT, CBC, and Lipid.   If you have labs (blood work) drawn today and your tests are completely normal, you will receive your results only by: MyChart Message (if you have MyChart) OR A paper copy in the mail If you have any lab test that is abnormal or we need to change your treatment, we will call you to review the results.  Testing/Procedures:  [place] [facility] OF [place] [place] [place] [address] [place] [address] Dept: [phone] Loc: [phone]  Chase [name]  06/23/2024  You are scheduled for a Cardiac Catheterization on Friday, September 12 with Dr. [name].  1. Please arrive at the [facility] Entrance of [facility], [address], [place] [address] at 6:30 AM (This is 1 hour(s) prior to your procedure time).  Proceed to the Check-In Desk directly inside the entrance.  Procedure Parking: Use the entrance off of the [address] side of the hospital. Turn right upon entering and follow the driveway to parking that is directly in front of the [facility] Center. There is no valet parking available at this entrance, however there is an awning directly in front of the [facility] Vascular Center for drop off/ pick up for patients.  Special note: Every effort is made to have your procedure done on time. Please understand that emergencies sometimes delay scheduled procedures.  2. Diet: Nothing to eat after midnight.   3. Hydration: You need to be well hydrated before your procedure. On September 12, you may drink  approved liquids (see below) until 2 hours before the procedure, with 16 oz of water  as your last intake.   List of approved liquids water , clear juice, clear tea, black coffee, fruit juices, non-citric and without pulp, carbonated beverages, Gatorade, Kool -Aid, plain Jello-O and plain ice popsicles.  4. Labs: You will need to have blood drawn today  5. Medication instructions in preparation for your procedure:   Contrast Allergy: No  On the morning of your procedure, take your Aspirin 81 mg and any morning medicines that won't upset your stomach.  You may use sips of water .  6. Plan to go home the same day, you will only stay overnight if medically necessary. 7. Bring a current list of your medications and current insurance cards. 8. You MUST have a responsible person to drive you home. 9. Someone MUST be with you the first 24 hours after you arrive home or your discharge will be delayed. 10. Please wear clothes that are easy to get on and off and wear slip-on shoes.  Thank you for allowing us  to care for you!   -- [place] [facility]   Follow-Up: At [facility], you and your health needs are our priority.  As part of our continuing mission to provide you with exceptional heart care, our providers are all part of one team.  This team includes your primary Cardiologist (physician) and Advanced Practice Providers or APPs (Physician Assistants and Nurse Practitioners) who all work together to provide you with the care you need, when  you need it.  Your next appointment:   1-2 week(s) after cath on 12 Sept  Provider:   You may see [name], MD or Bernardino Bring, PA-C  We recommend signing up for the patient portal called MyChart.  Sign up information is provided on this After Visit Summary.  MyChart is used to connect with patients for Virtual Visits (Telemedicine).  Patients are able to view lab/test results, encounter notes, upcoming appointments,  etc.  Non-urgent messages can be sent to your provider as well.   To learn more about what you can do with MyChart, go to [email or web address].

## 2024-06-24 ENCOUNTER — Ambulatory Visit: Payer: Self-pay | Admitting: Physician Assistant

## 2024-06-24 LAB — COMPREHENSIVE METABOLIC PANEL WITH GFR
ALT: 25 IU/L (ref 0–44)
AST: 19 IU/L (ref 0–40)
Albumin: 4.5 g/dL (ref 3.9–4.9)
Alkaline Phosphatase: 63 IU/L (ref 44–121)
BUN/Creatinine Ratio: 13 (ref 10–24)
BUN: 13 mg/dL (ref 8–27)
Bilirubin Total: 1 mg/dL (ref 0.0–1.2)
CO2: 23 mmol/L (ref 20–29)
Calcium: 9.3 mg/dL (ref 8.6–10.2)
Chloride: 101 mmol/L (ref 96–106)
Creatinine, Ser: 0.98 mg/dL (ref 0.76–1.27)
Globulin, Total: 2.2 g/dL (ref 1.5–4.5)
Glucose: 94 mg/dL (ref 70–99)
Potassium: 4.6 mmol/L (ref 3.5–5.2)
Sodium: 139 mmol/L (ref 134–144)
Total Protein: 6.7 g/dL (ref 6.0–8.5)
eGFR: 85 mL/min/1.73 (ref >59)

## 2024-06-24 LAB — LIPID PANEL
Chol/HDL Ratio: 2.2 ratio (ref 0.0–5.0)
Cholesterol, Total: 134 mg/dL (ref 100–199)
HDL: 60 mg/dL (ref >39)
LDL Chol Calc (NIH): 56 mg/dL (ref 0–99)
Triglycerides: 95 mg/dL (ref 0–149)
VLDL Cholesterol Cal: 18 mg/dL (ref 5–40)

## 2024-06-24 LAB — CBC
Hematocrit: 43.8 % (ref 37.5–51.0)
Hemoglobin: 14.8 g/dL (ref 13.0–17.7)
MCH: 33.3 pg — ABNORMAL HIGH (ref 26.6–33.0)
MCHC: 33.8 g/dL (ref 31.5–35.7)
MCV: 99 fL — ABNORMAL HIGH (ref 79–97)
Platelets: 194 x10E3/uL (ref 150–450)
RBC: 4.44 x10E6/uL (ref 4.14–5.80)
RDW: 12.7 % (ref 11.6–15.4)
WBC: 6.2 x10E3/uL (ref 3.4–10.8)

## 2024-07-01 ENCOUNTER — Encounter: Payer: Self-pay | Admitting: Cardiovascular Disease

## 2024-07-01 ENCOUNTER — Ambulatory Visit
Admission: RE | Admit: 2024-07-01 | Discharge: 2024-07-01 | Disposition: A | Discharge status: Home / Self Care | Attending: Cardiovascular Disease | Admitting: Cardiovascular Disease

## 2024-07-01 ENCOUNTER — Encounter
Admission: RE | Disposition: A | Discharge status: Home / Self Care | Payer: Self-pay | Source: Home / Self Care | Attending: Cardiovascular Disease

## 2024-07-01 ENCOUNTER — Other Ambulatory Visit: Payer: Self-pay

## 2024-07-01 ENCOUNTER — Ambulatory Visit: Admitting: Physician Assistant

## 2024-07-01 DIAGNOSIS — E785 Hyperlipidemia, unspecified: Secondary | ICD-10-CM | POA: Diagnosis not present

## 2024-07-01 DIAGNOSIS — I350 Nonrheumatic aortic (valve) stenosis: Secondary | ICD-10-CM

## 2024-07-01 DIAGNOSIS — Z8679 Personal history of other diseases of the circulatory system: Secondary | ICD-10-CM

## 2024-07-01 DIAGNOSIS — I119 Hypertensive heart disease without heart failure: Secondary | ICD-10-CM | POA: Insufficient documentation

## 2024-07-01 DIAGNOSIS — F17210 Nicotine dependence, cigarettes, uncomplicated: Secondary | ICD-10-CM | POA: Diagnosis not present

## 2024-07-01 DIAGNOSIS — I272 Pulmonary hypertension, unspecified: Secondary | ICD-10-CM | POA: Insufficient documentation

## 2024-07-01 DIAGNOSIS — I700 Atherosclerosis of aorta: Secondary | ICD-10-CM | POA: Diagnosis not present

## 2024-07-01 DIAGNOSIS — I714 Abdominal aortic aneurysm, without rupture, unspecified: Secondary | ICD-10-CM | POA: Insufficient documentation

## 2024-07-01 DIAGNOSIS — I358 Other nonrheumatic aortic valve disorders: Secondary | ICD-10-CM | POA: Insufficient documentation

## 2024-07-01 DIAGNOSIS — Z7982 Long term (current) use of aspirin: Secondary | ICD-10-CM | POA: Insufficient documentation

## 2024-07-01 DIAGNOSIS — Z79899 Other long term (current) drug therapy: Secondary | ICD-10-CM | POA: Insufficient documentation

## 2024-07-01 HISTORY — PX: RIGHT HEART CATH AND CORONARY ANGIOGRAPHY: CATH118264

## 2024-07-01 LAB — POCT I-STAT EG7
Acid-Base Excess: 1 mmol/L (ref 0.0–2.0)
Bicarbonate: 27.4 mmol/L (ref 20.0–28.0)
Calcium, Ion: 1.22 mmol/L (ref 1.15–1.40)
HCT: 41 % (ref 39.0–52.0)
Hemoglobin: 13.9 g/dL (ref 13.0–17.0)
O2 Saturation: 68 %
Potassium: 4.3 mmol/L (ref 3.5–5.1)
Sodium: 140 mmol/L (ref 135–145)
TCO2: 29 mmol/L (ref 22–32)
pCO2, Ven: 49.1 mmHg (ref 44–60)
pH, Ven: 7.354 (ref 7.25–7.43)
pO2, Ven: 38 mmHg (ref 32–45)

## 2024-07-01 LAB — POCT I-STAT 7, (LYTES, BLD GAS, ICA,H+H)
Acid-Base Excess: 0 mmol/L (ref 0.0–2.0)
Bicarbonate: 25.4 mmol/L (ref 20.0–28.0)
Calcium, Ion: 1.16 mmol/L (ref 1.15–1.40)
HCT: 40 % (ref 39.0–52.0)
Hemoglobin: 13.6 g/dL (ref 13.0–17.0)
O2 Saturation: 96 %
Potassium: 4.3 mmol/L (ref 3.5–5.1)
Sodium: 139 mmol/L (ref 135–145)
TCO2: 27 mmol/L (ref 22–32)
pCO2 arterial: 43 mmHg (ref 32–48)
pH, Arterial: 7.38 (ref 7.35–7.45)
pO2, Arterial: 81 mmHg — ABNORMAL LOW (ref 83–108)

## 2024-07-01 SURGERY — RIGHT HEART CATH AND CORONARY ANGIOGRAPHY
Anesthesia: Moderate Sedation | Laterality: Bilateral

## 2024-07-01 MED ORDER — VERAPAMIL HCL 2.5 MG/ML IV SOLN
INTRAVENOUS | Status: DC | PRN
  Administered 2024-07-01: 2.5 mg via INTRAVENOUS

## 2024-07-01 MED ORDER — VERAPAMIL HCL 2.5 MG/ML IV SOLN
INTRAVENOUS | Status: AC
Start: 2024-07-01 — End: 2024-07-01
  Filled 2024-07-01: qty 2

## 2024-07-01 MED ORDER — HEPARIN (PORCINE) IN NACL 1000-0.9 UT/500ML-% IV SOLN
INTRAVENOUS | Status: AC
  Filled 2024-07-01: qty 1000

## 2024-07-01 MED ORDER — LIDOCAINE HCL 1 % IJ SOLN
INTRAMUSCULAR | Status: AC
  Filled 2024-07-01: qty 20

## 2024-07-01 MED ORDER — SODIUM CHLORIDE 0.9% FLUSH: 3.0000 mL | Freq: Two times a day (BID) | INTRAVENOUS | Status: DC

## 2024-07-01 MED ORDER — SODIUM CHLORIDE 0.9% FLUSH: 3.0000 mL | INTRAVENOUS | Status: DC | PRN

## 2024-07-01 MED ORDER — HEPARIN SODIUM (PORCINE) 1000 UNIT/ML IJ SOLN
INTRAMUSCULAR | Status: AC
Start: 2024-07-01 — End: 2024-07-01
  Filled 2024-07-01: qty 10

## 2024-07-01 MED ORDER — MIDAZOLAM HCL 2 MG/2ML IJ SOLN
INTRAMUSCULAR | Status: AC
  Filled 2024-07-01: qty 2

## 2024-07-01 MED ORDER — ASPIRIN 81 MG PO CHEW: 81.0000 mg | CHEWABLE_TABLET | ORAL | Status: DC

## 2024-07-01 MED ORDER — ACETAMINOPHEN 325 MG PO TABS: 650.0000 mg | ORAL_TABLET | ORAL | Status: DC | PRN

## 2024-07-01 MED ORDER — FENTANYL CITRATE (PF) 100 MCG/2ML IJ SOLN
INTRAMUSCULAR | Status: DC | PRN
  Administered 2024-07-01: 25 ug via INTRAVENOUS

## 2024-07-01 MED ORDER — FENTANYL CITRATE (PF) 100 MCG/2ML IJ SOLN
INTRAMUSCULAR | Status: AC
  Filled 2024-07-01: qty 2

## 2024-07-01 MED ORDER — FREE WATER: 500.0000 mL | Freq: Once | Status: DC

## 2024-07-01 MED ORDER — LIDOCAINE HCL (PF) 1 % IJ SOLN
INTRAMUSCULAR | Status: DC | PRN
  Administered 2024-07-01: 5 mL

## 2024-07-01 MED ORDER — SODIUM CHLORIDE 0.9 % IV SOLN: 250.0000 mL | INTRAVENOUS | Status: DC | PRN

## 2024-07-01 MED ORDER — ONDANSETRON HCL 4 MG/2ML IJ SOLN: 4.0000 mg | Freq: Four times a day (QID) | INTRAMUSCULAR | Status: DC | PRN

## 2024-07-01 MED ORDER — HEPARIN (PORCINE) IN NACL 2000-0.9 UNIT/L-% IV SOLN
INTRAVENOUS | Status: DC | PRN
  Administered 2024-07-01: 1000 mL

## 2024-07-01 MED ORDER — HEPARIN SODIUM (PORCINE) 1000 UNIT/ML IJ SOLN
INTRAMUSCULAR | Status: DC | PRN
  Administered 2024-07-01: 5000 [IU] via INTRAVENOUS

## 2024-07-01 MED ORDER — IOHEXOL 300 MG/ML  SOLN
INTRAMUSCULAR | Status: DC | PRN
  Administered 2024-07-01: 26 mL

## 2024-07-01 MED ORDER — SODIUM CHLORIDE 0.9 % IV SOLN
250.0000 mL | INTRAVENOUS | Status: DC | PRN
  Administered 2024-07-01: 250 mL via INTRAVENOUS

## 2024-07-01 MED ORDER — MIDAZOLAM HCL 2 MG/2ML IJ SOLN
INTRAMUSCULAR | Status: DC | PRN
  Administered 2024-07-01: 1 mg via INTRAVENOUS

## 2024-07-01 SURGICAL SUPPLY — 12 items
CATH BALLN WEDGE 5F 110CM (CATHETERS) IMPLANT
CATH INFINITI AMBI 5FR JK (CATHETERS) IMPLANT
DEVICE RAD TR BAND REGULAR (VASCULAR PRODUCTS) IMPLANT
DRAPE BRACHIAL (DRAPES) IMPLANT
GLIDESHEATH SLEND SS 6F .021 (SHEATH) IMPLANT
GUIDEWIRE .025 260CM (WIRE) IMPLANT
GUIDEWIRE INQWIRE 1.5J.035X260 (WIRE) IMPLANT
KIT SYRINGE INJ CVI SPIKEX1 (MISCELLANEOUS) IMPLANT
PACK CARDIAC CATH (CUSTOM PROCEDURE TRAY) ×1 IMPLANT
SET ATX-X65L (MISCELLANEOUS) IMPLANT
SHEATH GLIDE SLENDER 4/5FR (SHEATH) IMPLANT
STATION PROTECTION PRESSURIZED (MISCELLANEOUS) IMPLANT

## 2024-07-01 NOTE — Discharge Instructions (Signed)
 Radial Site Care Refer to this sheet in the next few weeks. These instructions provide you with information about caring for yourself after your procedure. Your health care provider may also give you more specific instructions. Your treatment has been planned according to current medical practices, but problems sometimes occur. Call your health care provider if you have any problems or questions after your procedure. What can I expect after the procedure? After your procedure, it is typical to have the following: Bruising at the radial site that usually fades within 1-2 weeks. Blood collecting in the tissue (hematoma) that may be painful to the touch. It should usually decrease in size and tenderness within 1-2 weeks.  Follow these instructions at home: Take medicines only as directed by your health care provider. If you are on a medication called Metformin please do not take for 48 hours after your procedure. Over the next 48hrs please increase your fluid intake of water and non caffeine beverages to flush the contrast dye out of your system.  You may shower 24 hours after the procedure  Leave your bandage on and gently wash the site with plain soap and water. Pat the area dry with a clean towel. Do not rub the site, because this may cause bleeding.  Remove your dressing 48hrs after your procedure and leave open to air.  Do not submerge your site in water for 7 days. This includes swimming and washing dishes.  Check your insertion site every day for redness, swelling, or drainage. Do not apply powder or lotion to the site. Do not flex or bend the affected arm for 24 hours or as directed by your health care provider. Do not push or pull heavy objects with the affected arm for 24 hours or as directed by your health care provider. Do not lift over 10 lb (4.5 kg) for 5 days after your procedure or as directed by your health care provider. Ask your health care provider when it is okay to: Return to  work or school. Resume usual physical activities or sports. Resume sexual activity. Do not drive home if you are discharged the same day as the procedure. Have someone else drive you. You may drive 48 hours after the procedure Do not operate machinery or power tools for 24 hours after the procedure. If your procedure was done as an outpatient procedure, which means that you went home the same day as your procedure, a responsible adult should be with you for the first 24 hours after you arrive home. Keep all follow-up visits as directed by your health care provider. This is important. Contact a health care provider if: You have a fever. You have chills. You have increased bleeding from the radial site. Hold pressure on the site. Get help right away if: You have unusual pain at the radial site. You have redness, warmth, or swelling at the radial site. You have drainage (other than a small amount of blood on the dressing) from the radial site. The radial site is bleeding, and the bleeding does not stop after 15 minutes of holding steady pressure on the site. Your arm or hand becomes pale, cool, tingly, or numb. This information is not intended to replace advice given to you by your health care provider. Make sure you discuss any questions you have with your health care provider. Document Released: 11/08/2010 Document Revised: 03/13/2016 Document Reviewed: 04/24/2014 Elsevier Interactive Patient Education  2018 [employer].

## 2024-07-01 NOTE — Interval H&P Note (Signed)
 History and Physical Interval Note:  07/01/2024 7:50 AM  Chase [name]  has presented today for surgery, with the diagnosis of R and L Cath    Severe aortic stenosis.  The various methods of treatment have been discussed with the patient and family. After consideration of risks, benefits and other options for treatment, the patient has consented to  Procedure(s): RIGHT/LEFT HEART CATH AND CORONARY ANGIOGRAPHY (Bilateral) as a surgical intervention.  The patient's history has been reviewed, patient examined, no change in status, stable for surgery.  I have reviewed the patient's chart and labs.  Questions were answered to the patient's satisfaction.     [name]

## 2024-07-09 ENCOUNTER — Other Ambulatory Visit: Payer: Self-pay | Admitting: Physician Assistant

## 2024-07-12 NOTE — Progress Notes (Unsigned)
 Cardiology Office Note    Date:  07/13/2024   ID:  Chase [name], DOB Dec 04, 1955, MRN [id number]  PCP:  [name], MD  Cardiologist:  [name], MD  Electrophysiologist:  None   Chief Complaint: Follow-up  History of Present Illness:   Chase [name] is a 68 y.o. 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  Prior ETT in 05/2015, for atypical chest pain, showed no significant EKG changes concerning for ischemia with a hypertensive response to exercise.  He has been monitored periodically with echoes for aortic stenosis with echo from 04/2021 demonstrating an EF of 60 to 65%, no regional wall motion abnormalities, grade 1 diastolic dysfunction, normal RV systolic function and ventricular cavity size, severe calcification of the aortic valve with moderate stenosis with a mean gradient of 24.5 mmHg and valve area of 1.17 cm, borderline dilatation of the aortic root and ascending aorta measuring 38 mm and 39 mm, respectively.  He underwent CTA of the chest/aorta, mildly dilated aortic root in 05/2019, which demonstrated the ascending aorta was 3.9 cm.  He was seen in the office in 09/2021 and was working on acquiring Medicare with plans to pursue TEE once this was completed.  He underwent MRI of the lumbar spine in 03/2022, which demonstrated an abdominal aorta measuring up to 3.3 cm, though was suboptimally evaluated.  AAA ultrasound from 06/05/2022 showed a stable 3.3 cm AAA when compared to study from 03/2018.  Echo from 05/2022 demonstrated an EF of 60 to 65%, no  regional wall motion abnormalities, moderate LVH, grade 2 diastolic dysfunction, normal RV systolic function and ventricular cavity size, mildly dilated left atrium, indeterminate number of cusps on the aortic valve with moderate calcification and moderate aortic valve stenosis with a mean gradient of 23 mmHg and a valve area by VTI of 1.13 cm, and normal size and structure aortic root.  Echo from 05/2023 showed an EF of 60 to 65%, no regional wall motion abnormalities, moderate LVH of the basal septal segment, normal LV diastolic function parameters, normal RV systolic function with mildly enlarged ventricular cavity size, moderate aortic stenosis with a mean gradient of 25 mmHg, mild dilatation of the aortic root measuring 44 mm and mild dilatation of the ascending aorta measuring 41 mm, and an estimated right atrial pressure of 3 mmHg.  Abdominal aortic ultrasound from 05/2023 showed the largest aortic measurement of 2.6 cm (prior 3.3 cm), with the supraceliac portion of the aorta not visualized due to overlying bowel gas.  CTA aorta in 01/2024 showed 4.1 cm ascending thoracic aorta.  He did continue to smoke 1/2 pack of cigarettes per day.  Most recent echo from 05/2024 showed an EF of 55 to 60%, no regional wall motion abnormalities, mild LVH, normal LV diastolic function parameters, normal RV systolic function and ventricular cavity size, calcified aortic valve with mild insufficiency and severe aortic stenosis with a mean gradient of 34 mmHg and a valve area of 0.88 cm.  AAA ultrasound from 05/2024 showed a stable AAA measuring 3.2 cm.  In the setting of progressive aortic stenosis he underwent R/LHC  on 07/01/2024 that showed normal coronary arteries with a heavily calcified aortic valve by fluoroscopy with severe stenosis by echo.  The valve was not crossed during cardiac cath.  RHC showed mildly elevated right atrial pressure, mild pulmonary hypertension, mildly elevated wedge pressure, and normal cardiac  output.  He comes in accompanied by his wife today and is doing well from a cardiac perspective, currently without symptoms of angina or cardiac decompensation.  He does wonder if at times he is having some shortness of breath, dizziness, and mild left-sided chest discomfort.  At times he wonders if he is overthinking this.  No near-syncope or syncope.  He is scheduled to be evaluated by CVTS on 07/14/2024 to discuss AVR.  No right radial arteriotomy site complications.  Now wearing compression socks.  Continues to smoke, though has tapered uses some.  Not yet fully ready to quit.   Labs independently reviewed: 06/2024 -TC 134, TG 95, HDL 60, LDL 56, Hgb 14.8, PLT 194, BUN 13, serum creatinine 0.98, potassium 4.6, BUN 4.5, AST/ALT normal  Past Medical History:  Diagnosis Date   Arthritis    maybe - hands   Chest pain    a. ETT 06/14/2015: no st segment or T-waves changes during stress, mildly reduced exercise capacity, hypertensive response to exercise   Chicken pox    Chronic venous insufficiency    a. improved with support hose.   Erectile dysfunction    Essential hypertension    CONTROLLED ON MEDS   History of echocardiogram    a. 03/2016 Echo: EF 55-60%, no rwma, mild AS, triv AI, mildly dil Ao.   HLD (hyperlipidemia)    Measles    Mumps    Polysubstance abuse (HCC)    a. ongoing tobacco and alcohol abuse   Wears dentures    full upper    Past Surgical History:  Procedure Laterality Date   APPENDECTOMY  1972   COLONOSCOPY WITH PROPOFOL  N/A 08/27/2015   Procedure: COLONOSCOPY WITH PROPOFOL ;  Surgeon: Rogelia Copping, MD;  Location: [facility];  Service: Endoscopy;  Laterality: N/A;   POLYPECTOMY  08/27/2015   Procedure: POLYPECTOMY;  Surgeon: Rogelia Copping, MD;  Location: [facility];  Service: Endoscopy;;   RIGHT HEART CATH AND CORONARY ANGIOGRAPHY Bilateral 07/01/2024   Procedure: RIGHT HEART CATH AND CORONARY ANGIOGRAPHY;  Surgeon: Darron Deatrice LABOR, MD;  Location:  [facility];  Service: Cardiovascular;  Laterality: Bilateral;    Current Medications: Current Meds  Medication Sig   amLODipine  (NORVASC ) 10 MG tablet Take 1 tablet (10 mg total) by mouth daily. NEED OV.   aspirin  81 MG tablet Take 81 mg by mouth daily. AM   atorvastatin  (LIPITOR) 40 MG tablet TAKE 1 TABLET BY MOUTH EVERY DAY   carvedilol  (COREG ) 3.125 MG tablet TAKE 1 TABLET(3.125 MG) BY MOUTH TWICE DAILY   CINNAMON PO Take by mouth. AM   gabapentin (NEURONTIN) 100 MG capsule Take 100 mg by mouth 2 (two) times daily.   gabapentin (NEURONTIN) 300 MG capsule Take 300 mg by mouth at bedtime.   lisinopril  (ZESTRIL ) 40 MG tablet Take 1 tablet (40 mg total) by mouth daily.   Multiple Vitamin (MULTIVITAMIN) capsule Take 1 capsule by mouth daily. AM   Omega-3 Fatty Acids (FISH OIL) 1000 MG CAPS Take 1 capsule by mouth.   omeprazole (PRILOSEC OTC) 20 MG tablet Take 20 mg by mouth daily.    Allergies:   Patient has no known allergies.   Social History  Socioeconomic History   Marital status: Married    Spouse name: Not on file   Number of children: Not on file   Years of education: Not on file   Highest education level: Not on file  Occupational History   Occupation: Full-time    Comment: Works in Production designer, theatre/television/film at [employer]  Tobacco Use   Smoking status: Every Day    Current packs/day: 1.50    Average packs/day: 1.5 packs/day for 54.3 years (81.4 ttl pk-yrs)    Types: Cigarettes    Start date: 03/27/1970   Smokeless tobacco: Never   Tobacco comments:    Smokes 1.5-2 packs per day   Substance and Sexual Activity   Alcohol use: Yes    Alcohol/week: 24.0 standard drinks of alcohol    Types: 24 Cans of beer per week    Comment: drinks 4 beers daily   Drug use: No   Sexual activity: Not on file  Other Topics Concern   Not on file  Social History Narrative   Not on file   Social Drivers of Health   Financial Resource Strain: Not on file  Food Insecurity: Not on  file  Transportation Needs: Not on file  Physical Activity: Not on file  Stress: Not on file  Social Connections: Not on file     Family History:  The patient's family history includes Heart disease in his mother; Pancreatic cancer in his father.  ROS:   12-point review of systems is negative unless otherwise noted in the HPI.   EKGs/Labs/Other Studies Reviewed:    Studies reviewed were summarized above. The additional studies were reviewed today:  R/LHC 07/01/2024: 1.  Normal coronary arteries. 2.  Heavily calcified aortic valve by fluoroscopy with severe stenosis by echo.  I did not attempt to cross the valve. 3.  Right heart catheterization showed mildly elevated right atrial pressure, mild pulmonary hypertension, mildly elevated wedge pressure and normal cardiac output.   Recommendations: Proceed with aortic valve replacement evaluation.  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 __________  AAA ultrasound 06/17/2024: Summary:  Abdominal Aorta: There is evidence of abnormal dilatation of the proximal  Abdominal aorta. The largest aortic measurement is 3.2 cm. Previous  diameter measurement on 05/28/23 was 2.6 cm (supraceliac portion not  visualized due to bowel gas); previous  diameter measurement on 06/05/22 was 3.3cm.    Suggest follow up study in 12 months.  __________   2D echo 05/30/2024: 1. Left ventricular ejection fraction, by estimation, is 55 to 60%. The  left ventricle has normal function. The left ventricle has no regional  wall motion abnormalities. There is mild left ventricular hypertrophy.  Left ventricular diastolic parameters  were normal.   2. Right ventricular systolic function is normal. The right ventricular  size is normal.   3. The mitral valve is normal in structure. No evidence of mitral valve  regurgitation.   4. Aortic valve DVI 0.21. The aortic valve is calcified. Aortic valve  regurgitation is mild. Severe aortic valve stenosis.  Aortic valve area, by  VTI measures 0.88 cm. Aortic valve mean gradient measures 34.0 mmHg.  Aortic valve Vmax measures 3.95 m/s.   5. The inferior vena cava is normal in size with greater than 50%  respiratory variability, suggesting right atrial pressure of 3 mmHg.  __________   Abdominal aortic ultrasound 05/26/2023: Summary:  Abdominal Aorta: The largest aortic measurement is 2.6 cm. The supraceliac  portion of the aorta was not seen due to overlying bowel  gas. Previous  diameter measurement was 3.3 cm obtained on 06/05/22.  __________   2D echo 05/26/2023: 1. Left ventricular ejection fraction, by estimation, is 60 to 65%. The  left ventricle has normal function. The left ventricle has no regional  wall motion abnormalities. There is moderate left ventricular hypertrophy  of the basal-septal segment. Left  ventricular diastolic parameters were normal.   2. Right ventricular systolic function is normal. The right ventricular  size is mildly enlarged.   3. The mitral valve is normal in structure. No evidence of mitral valve  regurgitation.   4. Mean AV gradient , PG , Vmax 3.6m/s. The aortic valve is  calcified. Aortic valve regurgitation is not visualized. Moderate aortic  valve stenosis.   5. Aortic dilatation noted. There is mild dilatation of the aortic root,  measuring 44 mm. There is mild dilatation of the ascending aorta,  measuring 41 mm.   6. The inferior vena cava is normal in size with greater than 50%  respiratory variability, suggesting right atrial pressure of 3 mmHg.  __________   2D echo 06/05/2022: 1. Left ventricular ejection fraction, by estimation, is 60 to 65%. The  left ventricle has normal function. The left ventricle has no regional  wall motion abnormalities. There is moderate left ventricular hypertrophy.  Left ventricular diastolic  parameters are consistent with Grade II diastolic dysfunction  (pseudonormalization). The average left  ventricular global longitudinal  strain is -19.4 %.   2. Right ventricular systolic function is normal. The right ventricular  size is normal.   3. Left atrial size was mildly dilated.   4. The mitral valve is normal in structure. No evidence of mitral valve  regurgitation. No evidence of mitral stenosis.   5. The aortic valve has an indeterminant number of cusps. There is  moderate calcification of the aortic valve. Aortic valve regurgitation is  not visualized. Moderate aortic valve stenosis. Aortic valve area, by VTI  measures 1.13 cm. Aortic valve mean  gradient measures 23.0 mmHg. Aortic valve Vmax measures 3.56 m/s.   6. The inferior vena cava is normal in size with greater than 50%  respiratory variability, suggesting right atrial pressure of 3 mmHg.   Comparison(s): Previous AV PG's 48 mmHg max, 25 mmHg mean  __________   AAA ultrasound 06/05/2022: Summary:  Abdominal Aorta: There is evidence of abnormal dilatation of the proximal  Abdominal aorta. The largest aortic measurement is 3.3 cm. The largest  aortic diameter remains essentially unchanged compared to prior exam.  Previous diameter measurement was 3.3  cm obtained on 03/22/18.  __________   2D echo 04/30/2021: 1. Left ventricular ejection fraction, by estimation, is 60 to 65%. The  left ventricle has normal function. The left ventricle has no regional  wall motion abnormalities. Left ventricular diastolic parameters are  consistent with Grade I diastolic  dysfunction (impaired relaxation).   2. Right ventricular systolic function is normal. The right ventricular  size is normal.   3. The mitral valve is normal in structure. No evidence of mitral valve  regurgitation. No evidence of mitral stenosis.   4. The aortic valve is normal in structure. There is severe calcifcation  of the aortic valve. Aortic valve regurgitation is not visualized.  Moderate aortic valve stenosis. Aortic valve area, by VTI measures 1.17   cm. Aortic valve mean gradient measures  24.5 mmHg. Aortic valve Vmax measures 3.46 m/s.   5. There is borderline dilatation of the ascending aorta, measuring 39  mm. There is  borderline dilatation of the aortic root, measuring 38 mm. __________   2D echo 05/13/2019: 1. The left ventricle has normal systolic function, with an ejection  fraction of 55-60%. The cavity size was normal. There is mildly increased  left ventricular wall thickness of the septal wall. Left ventricular  diastolic Doppler parameters are  consistent with pseudonormalization.   2. The right ventricle has normal systolic function. The cavity was  normal. There is no increase in right ventricular wall thickness. Unable  to estimate RVSP.   3. Moderate stenosis of the aortic valve. Mean gradient of 24 mm Hg, peak  gradient of 44 mm Hg, estimated AVA 1.3 cmsq   4. There is dilatation of the aortic root 4.5 cm and of the ascending  aorta 4.2 cm. __________   2D echo 04/09/2016: - Left ventricle: The cavity size was normal. Wall thickness was    increased in a pattern of mild LVH. Systolic function was normal.    The estimated ejection fraction was in the range of 55% to 60%.    Wall motion was normal; there were no regional wall motion    abnormalities.  - Aortic valve: Calcified annulus. Moderately thickened leaflets.    At the most, there was very mild stenosis. There was trivial    regurgitation. Valve area (VTI): 1.86 cm^2. Valve area (Vmax):    1.74 cm^2. Valve area (Vmean): 1.69 cm^2.  - Aorta: The aorta was mildly dilated. __________   ETT 06/14/2015: There was no ST segment deviation noted during stress. No T wave inversion was noted during stress.   Normal treadmill stress test. Mildly reduced exercise capacity with hypertensive response to exercise.   EKG:  EKG is ordered today.  The EKG ordered today demonstrates NSR, 68 bpm, first-degree AV block, no acute ST-T changes  Recent Labs: 06/23/2024:  ALT 25; BUN 13; Creatinine, Ser 0.98; Platelets 194 07/01/2024: Hemoglobin 13.9; Potassium 4.3; Sodium 140  Recent Lipid Panel    Component Value Date/Time   CHOL 134 06/23/2024 1150   TRIG 95 06/23/2024 1150   HDL 60 06/23/2024 1150   CHOLHDL 2.2 06/23/2024 1150   CHOLHDL 2.6 09/27/2021 0959   VLDL 14 09/27/2021 0959   LDLCALC 56 06/23/2024 1150    PHYSICAL EXAM:    VS:  BP 116/74 (BP Location: Left Arm, Patient Position: Sitting, Cuff Size: Normal)   Pulse 68   Ht 6' 1 (1.854 m)   Wt 230 lb (104.3 kg)   SpO2 98%   BMI 30.34 kg/m   BMI: Body mass index is 30.34 kg/m.  Physical Exam Constitutional:      Appearance: He is well-developed.  HENT:     Head: Normocephalic and atraumatic.  Eyes:     General:        Right eye: No discharge.        Left eye: No discharge.  Cardiovascular:     Rate and Rhythm: Normal rate and regular rhythm.     Heart sounds: S1 normal and S2 normal. Heart sounds not distant. No midsystolic click and no opening snap. Murmur heard.     Harsh midsystolic murmur is present with a grade of 3/6 at the upper right sternal border radiating to the neck.     No friction rub.     Comments: Right radial arteriotomy site is well-healed without active bleeding, bruising, swelling, warmth, erythema, or tenderness to palpation.  Radial pulse 2+ proximal and distal to the arteriotomy site. Pulmonary:  Effort: Pulmonary effort is normal. No respiratory distress.     Breath sounds: Normal breath sounds. No decreased breath sounds, wheezing, rhonchi or rales.  Musculoskeletal:     Cervical back: Normal range of motion.     Comments: Compression socks in place.  Skin:    General: Skin is warm and dry.     Nails: There is no clubbing.  Neurological:     Mental Status: He is alert and oriented to person, place, and time.  Psychiatric:        Speech: Speech normal.        Behavior: Behavior normal.        Thought Content: Thought content normal.         Judgment: Judgment normal.     Wt Readings from Last 3 Encounters:  07/13/24 230 lb (104.3 kg)  07/01/24 225 lb (102.1 kg)  06/23/24 226 lb 12.8 oz (102.9 kg)     ASSESSMENT & PLAN:   Severe aortic stenosis: No symptoms of cardiac decompensation, near-syncope, or syncope.  Echo in 05/2024 showed progression of aortic valve stenosis, now severe.  Fluoroscopy during LHC earlier this month showed heavily calcified aortic valve with severe stenosis without attempting to cross the valve.  His appointment is scheduled with CVTS on 07/14/2024.  Coronary artery calcification: Noted on prior CT.  LHC earlier this month that showed normal coronary arteries.  Continue aggressive risk factor modification and primary prevention including aspirin  81 mg and atorvastatin  40 mg.  Post-cath instructions.  No indication for further ischemic testing at this time.  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 Normal size and structure of aorta documented on echo last month. Anticipate CTA aorta in 01/2025. Optimal blood pressure control recommended.   AAA: Stable by ultrasound in 05/2024, measuring 3.2 cm.  Repeat imaging in 05/2025.  Optimal blood pressure control and smoking cessation recommended.  HTN: Blood pressure is well-controlled in the office today.  He remains on amlodipine  10 mg, carvedilol  3.125 mg twice daily, and lisinopril  40 mg.  Recent labs showed stable renal function and electrolytes.  HLD: LDL 56 in 06/2024 with normal AST/ALT at that time.  Remains on atorvastatin  40 mg.  Tobacco use: Has decreased use some.  Not fully ready to quit yet.  Understands risks of continued smoking and need for complete cessation.  Offered assistance.  Complete cessation recommended.     Disposition: F/u with Dr. Darron or an APP in 3 months.   Medication  Adjustments/Labs and Tests Ordered: Current medicines are reviewed at length with the patient today.  Concerns regarding medicines are outlined above. Medication changes, Labs and Tests ordered today are summarized above and listed in the Patient Instructions accessible in Encounters.   Signed, Bernardino Bring, PA-C 07/13/2024 9:46 AM     [facility] [place] [address] [place], [place] [address] [phone]

## 2024-07-13 ENCOUNTER — Encounter: Payer: Self-pay | Admitting: Physician Assistant

## 2024-07-13 ENCOUNTER — Ambulatory Visit: Attending: Physician Assistant | Admitting: Physician Assistant

## 2024-07-13 VITALS — BP 116/74 | HR 68 | Ht 73.0 in | Wt 230.0 lb

## 2024-07-13 DIAGNOSIS — I10 Essential (primary) hypertension: Secondary | ICD-10-CM | POA: Diagnosis not present

## 2024-07-13 DIAGNOSIS — I350 Nonrheumatic aortic (valve) stenosis: Secondary | ICD-10-CM | POA: Diagnosis not present

## 2024-07-13 DIAGNOSIS — E785 Hyperlipidemia, unspecified: Secondary | ICD-10-CM

## 2024-07-13 DIAGNOSIS — I714 Abdominal aortic aneurysm, without rupture, unspecified: Secondary | ICD-10-CM

## 2024-07-13 DIAGNOSIS — I77810 Thoracic aortic ectasia: Secondary | ICD-10-CM

## 2024-07-13 DIAGNOSIS — Z720 Tobacco use: Secondary | ICD-10-CM

## 2024-07-13 DIAGNOSIS — I2510 Atherosclerotic heart disease of native coronary artery without angina pectoris: Secondary | ICD-10-CM | POA: Diagnosis not present

## 2024-07-13 NOTE — Patient Instructions (Signed)
 Medication Instructions:  Your physician recommends that you continue on your current medications as directed. Please refer to the Current Medication list given to you today.   *If you need a refill on your cardiac medications before your next appointment, please call your pharmacy*  Lab Work: None ordered at this time   Follow-Up: At [facility] you and your health needs are our priority.  As part of our continuing mission to provide you with exceptional heart care, our providers are all part of one team.  This team includes your primary Cardiologist (physician) and Advanced Practice Providers or APPs (Physician Assistants and Nurse Practitioners) who all work together to provide you with the care you need, when you need it.  Your next appointment:   3 month(s)  Provider:   You may see [name], MD or Bernardino Bring, PA-C  We recommend signing up for the patient portal called MyChart.  Sign up information is provided on this After Visit Summary.  MyChart is used to connect with patients for Virtual Visits (Telemedicine).  Patients are able to view lab/test results, encounter notes, upcoming appointments, etc.  Non-urgent messages can be sent to your provider as well.   To learn more about what you can do with MyChart, go to [email or web address].

## 2024-07-14 ENCOUNTER — Ambulatory Visit

## 2024-07-14 ENCOUNTER — Other Ambulatory Visit: Payer: Self-pay | Admitting: Physician Assistant

## 2024-07-14 VITALS — BP 120/73 | HR 74 | Resp 20 | Ht 73.0 in | Wt 230.0 lb

## 2024-07-14 DIAGNOSIS — I350 Nonrheumatic aortic (valve) stenosis: Secondary | ICD-10-CM | POA: Diagnosis not present

## 2024-07-14 NOTE — Progress Notes (Unsigned)
 [address], [place] [address]             [phone]    Chase [name] [facility] Medical Record #[id number] Date of Birth: [birth year]  Referring: Darron Deatrice LABOR, MD Primary Care: [name], MD Primary Cardiologist:Muhammad Darron, MD  Chief Complaint:   No chief complaint on file.   History of Present Illness:     Chase [name] is a 68 y.o. male who presents for surgical evaluation of ***  HTN, HL, tobacco, EtOH, venous insufficiency, obesit.y   He comes in accompanied by his wife today and is doing well from a cardiac perspective, currently without symptoms of angina or cardiac decompensation. He does wonder if at times he is having some shortness of breath, dizziness, and mild left-sided chest discomfort. At times he wonders if he is overthinking this. No near-syncope or syncope. He is scheduled to be evaluated by CVTS on 07/14/2024 to discuss AVR. No right radial arteriotomy site complications. Now wearing compression socks. Continues to smoke, though has tapered uses some. Not yet fully ready to quit.   The aortic valve is calcified. Aortic valve  regurgitation is mild. Severe aortic valve stenosis. Aortic valve area, by  VTI measures 0.88 cm. Aortic valve mean gradient measures 34.0 mmHg.  Aortic valve Vmax measures 3.95 m/s.   Has some shortness of breath that comes and goes.  Sometimes has some twingers of pain that is reproducible.  Also has a had a chronic cough but denies fever or chills.  Upper plate of dentures.  Does not see a dentist regularly - last time 3-5 years ago. He will see the dentist 10/2.  Active smoker - 1 pack per day x 45-50 years.  Drinks alcohol 6 beers a day, sometimes less.  Denies liver issues, no kidney issues, no stroke, no MI.    No blood thinners, no chest surgery, no radiation.  No diabetes, no history of stroke.    On a daily basis, he is semi-retired, works part time as a Surveyor, quantity at [employer].  Once a  week he walks the building but mostly sitting at a desk.  He is independent in his ADLs.    Past Medical and Surgical History: Previous Chest Surgery: *** Previous Chest Radiation: *** Diabetes Mellitus: ***.  HbA1C *** Anticoagulation: ***, Last dose ***  Creatinine:  Lab Results  Component Value Date   CREATININE 0.98 06/23/2024   CREATININE 0.93 06/29/2023   CREATININE 0.86 09/27/2021     Past Medical History:  Diagnosis Date   Arthritis    maybe - hands   Chest pain    a. ETT 06/14/2015: no st segment or T-waves changes during stress, mildly reduced exercise capacity, hypertensive response to exercise   Chicken pox    Chronic venous insufficiency    a. improved with support hose.   Erectile dysfunction    Essential hypertension    CONTROLLED ON MEDS   History of echocardiogram    a. 03/2016 Echo: EF 55-60%, no rwma, mild AS, triv AI, mildly dil Ao.   HLD (hyperlipidemia)    Measles    Mumps    Polysubstance abuse (HCC)    a. ongoing tobacco and alcohol abuse   Wears dentures    full upper    Past Surgical History:  Procedure Laterality Date   APPENDECTOMY  1972   COLONOSCOPY WITH PROPOFOL  N/A 08/27/2015   Procedure: COLONOSCOPY WITH PROPOFOL ;  Surgeon:  Rogelia Copping, MD;  Location: [facility];  Service: Endoscopy;  Laterality: N/A;   POLYPECTOMY  08/27/2015   Procedure: POLYPECTOMY;  Surgeon: Rogelia Copping, MD;  Location: [facility];  Service: Endoscopy;;   RIGHT HEART CATH AND CORONARY ANGIOGRAPHY Bilateral 07/01/2024   Procedure: RIGHT HEART CATH AND CORONARY ANGIOGRAPHY;  Surgeon: Darron Deatrice LABOR, MD;  Location: [facility] INVASIVE CV LAB;  Service: Cardiovascular;  Laterality: Bilateral;    Social History:  Social History   Tobacco Use  Smoking Status Every Day   Current packs/day: 1.50   Average packs/day: 1.5 packs/day for 54.3 years (81.4 ttl pk-yrs)   Types: Cigarettes   Start date: 03/27/1970  Smokeless Tobacco Never  Tobacco Comments    Smokes 1.5-2 packs per day     Social History   Substance and Sexual Activity  Alcohol Use Yes   Alcohol/week: 24.0 standard drinks of alcohol   Types: 24 Cans of beer per week   Comment: drinks 4 beers daily     No Known Allergies  Medications: Asprin: *** Statin: *** Beta Blocker: *** Ace Inhibitor: *** Anti-Coagulation: ***  Current Outpatient Medications  Medication Sig Dispense Refill   amLODipine  (NORVASC ) 10 MG tablet Take 1 tablet (10 mg total) by mouth daily. NEED OV. 90 tablet 0   aspirin  81 MG tablet Take 81 mg by mouth daily. AM     atorvastatin  (LIPITOR) 40 MG tablet TAKE 1 TABLET BY MOUTH EVERY DAY 90 tablet 3   carvedilol  (COREG ) 3.125 MG tablet TAKE 1 TABLET(3.125 MG) BY MOUTH TWICE DAILY 180 tablet 3   CINNAMON PO Take by mouth. AM     gabapentin (NEURONTIN) 100 MG capsule Take 100 mg by mouth 2 (two) times daily.     gabapentin (NEURONTIN) 300 MG capsule Take 300 mg by mouth at bedtime.     lisinopril  (ZESTRIL ) 40 MG tablet Take 1 tablet (40 mg total) by mouth daily. 90 tablet 3   Multiple Vitamin (MULTIVITAMIN) capsule Take 1 capsule by mouth daily. AM     Omega-3 Fatty Acids (FISH OIL) 1000 MG CAPS Take 1 capsule by mouth.     omeprazole (PRILOSEC OTC) 20 MG tablet Take 20 mg by mouth daily.     No current facility-administered medications for this visit.    (Not in a hospital admission)   Family History  Problem Relation Age of Onset   Heart disease Mother    Pancreatic cancer Father      Review of Systems:   ROS    Physical Exam: There were no vitals taken for this visit. Physical Exam    Diagnostic Studies & Laboratory data: Cardiac Studies & Procedures   ______________________________________________________________________________________________ CARDIAC CATHETERIZATION  CARDIAC CATHETERIZATION 07/01/2024  Conclusion 1.  Normal coronary arteries. 2.  Heavily calcified aortic valve by fluoroscopy with severe stenosis by echo.   I did not attempt to cross the valve. 3.  Right heart catheterization showed mildly elevated right atrial pressure, mild pulmonary hypertension, mildly elevated wedge pressure and normal cardiac output.  Recommendations: Proceed with aortic valve replacement evaluation.  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  Findings Coronary Findings Diagnostic  Dominance: Right  Left Main Vessel is angiographically normal.  Left Circumflex Vessel is angiographically normal.  Right Coronary Artery Vessel is angiographically normal.  Right Posterior Descending Artery Vessel is angiographically normal.  Right Posterior Atrioventricular Artery Vessel is angiographically normal.  Intervention  No interventions have been documented.   STRESS TESTS  EXERCISE TOLERANCE TEST (ETT)  06/14/2015  Interpretation Summary  There was no ST segment deviation noted during stress.  No T wave inversion was noted during stress.  Normal treadmill stress test. Mildly reduced exercise capacity with hypertensive response to exercise.   ECHOCARDIOGRAM  ECHOCARDIOGRAM COMPLETE 05/30/2024  Narrative ECHOCARDIOGRAM REPORT    Patient Name:   NEHAN FLAUM Date of Exam: 05/30/2024 Medical Rec #:  [id number]  Height:       73.0 in Accession #:    [id number] Weight:       226.6 lb Date of Birth:  Oct 29, 1955  BSA:          2.269 m Patient Age:    68 years   BP:           128/80 mmHg Patient Gender: M          HR:           73 bpm. Exam Location:  [facility]  Procedure: 2D Echo, Cardiac Doppler, Color Doppler and Strain Analysis (Both Spectral and Color Flow Doppler were utilized during procedure).  Indications:    I35.0 Nonrheumatic aortic (valve) stenosis  History:        Patient has prior history of Echocardiogram examinations, most recent 05/26/2023. Signs/Symptoms:Chest Pain; Risk Factors:Diabetes, Current Smoker, Hypertension and Dyslipidemia.  Sonographer:    Doyal Point MHA, BS,  RDCS Referring Phys: [id number] BERNARDINO CHRISTELLA BRING   Sonographer Comments: Increase in AV gradient from prior 25 mmHg to 34 mmHg. IMPRESSIONS   1. Left ventricular ejection fraction, by estimation, is 55 to 60%. The left ventricle has normal function. The left ventricle has no regional wall motion abnormalities. There is mild left ventricular hypertrophy. Left ventricular diastolic parameters were normal. 2. Right ventricular systolic function is normal. The right ventricular size is normal. 3. The mitral valve is normal in structure. No evidence of mitral valve regurgitation. 4. Aortic valve DVI 0.21. The aortic valve is calcified. Aortic valve regurgitation is mild. Severe aortic valve stenosis. Aortic valve area, by VTI measures 0.88 cm. Aortic valve mean gradient measures 34.0 mmHg. Aortic valve Vmax measures 3.95 m/s. 5. The inferior vena cava is normal in size with greater than 50% respiratory variability, suggesting right atrial pressure of 3 mmHg.  FINDINGS Left Ventricle: Left ventricular ejection fraction, by estimation, is 55 to 60%. The left ventricle has normal function. The left ventricle has no regional wall motion abnormalities. The left ventricular internal cavity size was normal in size. There is mild left ventricular hypertrophy. Left ventricular diastolic parameters were normal.  Right Ventricle: The right ventricular size is normal. No increase in right ventricular wall thickness. Right ventricular systolic function is normal.  Left Atrium: Left atrial size was normal in size.  Right Atrium: Right atrial size was normal in size.  Pericardium: There is no evidence of pericardial effusion.  Mitral Valve: The mitral valve is normal in structure. No evidence of mitral valve regurgitation.  Tricuspid Valve: The tricuspid valve is normal in structure. Tricuspid valve regurgitation is not demonstrated.  Aortic Valve: Aortic valve DVI 0.21. The aortic valve is calcified. Aortic  valve regurgitation is mild. Severe aortic stenosis is present. Aortic valve mean gradient measures 34.0 mmHg. Aortic valve peak gradient measures 62.4 mmHg. Aortic valve area, by VTI measures 0.88 cm.  Pulmonic Valve: The pulmonic valve was normal in structure. Pulmonic valve regurgitation is not visualized.  Aorta: The aortic root is normal in size and structure.  Venous: The inferior vena cava is normal in size with greater than  50% respiratory variability, suggesting right atrial pressure of 3 mmHg.  IAS/Shunts: No atrial level shunt detected by color flow Doppler.   LEFT VENTRICLE PLAX 2D LVIDd:         4.74 cm   Diastology LVIDs:         3.49 cm   LV e' medial:    8.70 cm/s LV PW:         1.13 cm   LV E/e' medial:  11.1 LV IVS:        1.12 cm   LV e' lateral:   8.70 cm/s LVOT diam:     2.30 cm   LV E/e' lateral: 11.1 LV SV:         82 LV SV Index:   36 LVOT Area:     4.15 cm   RIGHT VENTRICLE RV Basal diam:  2.93 cm RV Mid diam:    2.18 cm RV S prime:     12.70 cm/s TAPSE (M-mode): 3.3 cm  LEFT ATRIUM             Index        RIGHT ATRIUM           Index LA diam:        3.50 cm 1.54 cm/m   RA Area:     16.50 cm LA Vol (A2C):   45.0 ml 19.83 ml/m  RA Volume:   41.40 ml  18.25 ml/m LA Vol (A4C):   36.6 ml 16.13 ml/m LA Biplane Vol: 40.4 ml 17.81 ml/m AORTIC VALVE AV Area (Vmax):    0.85 cm AV Area (Vmean):   0.86 cm AV Area (VTI):     0.88 cm AV Vmax:           395.00 cm/s AV Vmean:          267.000 cm/s AV VTI:            0.926 m AV Peak Grad:      62.4 mmHg AV Mean Grad:      34.0 mmHg LVOT Vmax:         80.70 cm/s LVOT Vmean:        55.500 cm/s LVOT VTI:          0.197 m LVOT/AV VTI ratio: 0.21  AORTA Ao Sinus diam: 3.68 cm Ao Asc diam:   3.70 cm  MITRAL VALVE MV Area (PHT): 5.38 cm    SHUNTS MV Decel Time: 141 msec    Systemic VTI:  0.20 m MV E velocity: 96.90 cm/s  Systemic Diam: 2.30 cm MV A velocity: 67.60 cm/s MV E/A ratio:   1.43  Redell Cave MD Electronically signed by Redell Cave MD Signature Date/Time: 05/30/2024/12:47:33 PM    Final          ______________________________________________________________________________________________     EKG: *** I have independently reviewed the above radiologic studies and discussed with the patient   Recent Lab Findings: Lab Results  Component Value Date   WBC 6.2 06/23/2024   HGB 13.9 07/01/2024   HCT 41.0 07/01/2024   PLT 194 06/23/2024   GLUCOSE 94 06/23/2024   CHOL 134 06/23/2024   TRIG 95 06/23/2024   HDL 60 06/23/2024   LDLCALC 56 06/23/2024   ALT 25 06/23/2024   AST 19 06/23/2024   NA 140 07/01/2024   K 4.3 07/01/2024   CL 101 06/23/2024   CREATININE 0.98 06/23/2024   BUN 13 06/23/2024   CO2 23 06/23/2024   TSH 1.490  06/07/2019      Assessment / Plan:   68 y.o. male with ***     I  spent {CHL ONC TIME VISIT - DTPQU:[id number]} counseling the patient face to face.   Con RAMAN Destanie Tibbetts 07/14/2024 10:45 AM

## 2024-07-14 NOTE — Patient Instructions (Signed)
 You must see the dentist to evaluate your teeth.  I will be in touch with you regarding TAVR and surgical planning.

## 2024-07-18 ENCOUNTER — Encounter (HOSPITAL_COMMUNITY): Payer: Self-pay | Admitting: Cardiovascular Disease

## 2024-07-19 ENCOUNTER — Encounter (HOSPITAL_COMMUNITY): Payer: Self-pay

## 2024-07-20 ENCOUNTER — Ambulatory Visit (HOSPITAL_COMMUNITY)
Admission: RE | Admit: 2024-07-20 | Discharge: 2024-07-20 | Disposition: A | Discharge status: Home / Self Care | Source: Ambulatory Visit

## 2024-07-20 DIAGNOSIS — Z01818 Encounter for other preprocedural examination: Secondary | ICD-10-CM | POA: Diagnosis not present

## 2024-07-20 DIAGNOSIS — I350 Nonrheumatic aortic (valve) stenosis: Secondary | ICD-10-CM | POA: Diagnosis not present

## 2024-07-20 DIAGNOSIS — K769 Liver disease, unspecified: Secondary | ICD-10-CM | POA: Diagnosis not present

## 2024-07-20 DIAGNOSIS — I2510 Atherosclerotic heart disease of native coronary artery without angina pectoris: Secondary | ICD-10-CM | POA: Diagnosis not present

## 2024-07-20 DIAGNOSIS — I708 Atherosclerosis of other arteries: Secondary | ICD-10-CM | POA: Diagnosis not present

## 2024-07-20 MED ORDER — IOHEXOL 350 MG/ML SOLN
100.0000 mL | Freq: Once | INTRAVENOUS | Status: AC | PRN
Start: 2024-07-20 — End: 2024-07-20
  Administered 2024-07-20: 100 mL via INTRAVENOUS

## 2024-07-21 ENCOUNTER — Ambulatory Visit: Payer: Self-pay

## 2024-07-22 ENCOUNTER — Ambulatory Visit: Payer: Self-pay | Admitting: Physician Assistant

## 2024-07-22 NOTE — Progress Notes (Signed)
 STS SCORE  Procedure Type: Isolated AVR  Perioperative Outcome Estimate %  Operative Mortality 1.16%  Morbidity & Mortality 4.57%  Stroke 0.493%  Renal Failure 0.558%  Reoperation 2.85%  Prolonged Ventilation 1.64%  Deep Sternal Wound Infection 0.083%  Long Hospital Stay (>14 days) 1.79%  Short Hospital Stay (<6 days)* 68.5%

## 2024-07-26 ENCOUNTER — Telehealth: Payer: Self-pay

## 2024-07-26 NOTE — Telephone Encounter (Signed)
 Called patient to inform him that we discussed his case at multidisciplinary valve conference this morning and that the consensus was to proceed with TAVR.  He is in agreement and excited about TAVR.  He has a periodontist appointment on 10/21 for his gum infection.  I informed him that the TAVR team will be following up with him regarding next steps.  He expressed understanding.  Con Clunes, MD Cardiothoracic Surgery Pager: [phone]

## 2024-07-27 NOTE — Telephone Encounter (Signed)
 I spoke with the pt and advised him that I will plan to contact him after his 10/21 appointment with periodontist. Based on the recommendations from that visit then we can arrange TAVR evaluation with an interventional cardiologist. Pt agreed with plan.

## 2024-08-24 ENCOUNTER — Other Ambulatory Visit: Payer: Self-pay

## 2024-08-24 DIAGNOSIS — I350 Nonrheumatic aortic (valve) stenosis: Secondary | ICD-10-CM

## 2024-08-25 ENCOUNTER — Encounter: Payer: Self-pay | Admitting: Cardiovascular Disease

## 2024-08-25 ENCOUNTER — Ambulatory Visit: Attending: Cardiovascular Disease | Admitting: Cardiovascular Disease

## 2024-08-25 ENCOUNTER — Other Ambulatory Visit: Payer: Self-pay | Admitting: Physician Assistant

## 2024-08-25 VITALS — BP 103/59 | HR 72 | Resp 16 | Ht 73.0 in | Wt 229.6 lb

## 2024-08-25 DIAGNOSIS — I350 Nonrheumatic aortic (valve) stenosis: Secondary | ICD-10-CM | POA: Diagnosis not present

## 2024-08-25 NOTE — Progress Notes (Signed)
 [facility] Consult Note  Chief Complaint  Patient presents with   New Patient (Initial Visit)    Aortic stenosis   History of Present Illness: 68 yo Chase [name] with history of coronary artery calcification, AAA, ascending thoracic aortic aneurysm, HTN, HLD, ongoing alcohol and tobacco abuse, obesity and severe aortic stenosis who is here today as a new consult, referred by Dr. Darron, for further discussion regarding his aortic stenosis and planning for TAVR. Cardiac cath in September 2025 with no evidence of CAD. Mildly elevated right heart pressures. Cardiac CT with AV calcium  score of 5697. Bicuspid aortic valve with annular area of 699 mm2 suitable for 29 mm Edwards Sapien Ultra Resilia THV with extra volume. Mild dilation of the aortic root. He has transfemoral access. He was seen in the CT surgery clinic on 07/14/24 by Dr. Daniel and while he is felt to be a candidate for surgery, the best plan for AVR is felt to be TAVR after discussion with the patient. Plan is to follow his dilated aortic root. He has undergone a dental cleaning and extraction of one tooth.   He tells me today that he progressive dyspnea on exertion and fatigue. No chest pain or dizziness. He has some LE edema. He lives in [place], [place] with his wife. He is retired and now working part time as a librarian, academic. He has seen a dentist and had a recent dental extraction.   Primary Care Physician: [name], MD Primary Cardiologist: Darron Referring Cardiologist: Darron  Past Medical History:  Diagnosis Date   Arthritis    maybe - hands   Chest pain    a. ETT 06/14/2015: no st segment or T-waves changes during stress, mildly reduced exercise capacity, hypertensive response to exercise   Chicken pox    Chronic venous insufficiency    a. improved with support hose.   Erectile dysfunction    Essential hypertension    CONTROLLED ON MEDS   History of echocardiogram    a. 03/2016 Echo: EF Chase-60%,  no rwma, mild AS, triv AI, mildly dil Ao.   HLD (hyperlipidemia)    Measles    Mumps    Polysubstance abuse (HCC)    a. ongoing tobacco and alcohol abuse   Wears dentures    full upper    Past Surgical History:  Procedure Laterality Date   APPENDECTOMY  1972   COLONOSCOPY WITH PROPOFOL  N/A 08/27/2015   Procedure: COLONOSCOPY WITH PROPOFOL ;  Surgeon: Rogelia Copping, MD;  Location: [facility];  Service: Endoscopy;  Laterality: N/A;   POLYPECTOMY  08/27/2015   Procedure: POLYPECTOMY;  Surgeon: Rogelia Copping, MD;  Location: [facility];  Service: Endoscopy;;   RIGHT HEART CATH AND CORONARY ANGIOGRAPHY Bilateral 07/01/2024   Procedure: RIGHT HEART CATH AND CORONARY ANGIOGRAPHY;  Surgeon: Darron Deatrice LABOR, MD;  Location: [facility];  Service: Cardiovascular;  Laterality: Bilateral;    Current Outpatient Medications  Medication Sig Dispense Refill   amLODipine  (NORVASC ) 10 MG tablet Take 1 tablet (10 mg total) by mouth daily. NEED OV. 90 tablet 0   aspirin  81 MG tablet Take 81 mg by mouth daily. AM     atorvastatin  (LIPITOR) 40 MG tablet TAKE 1 TABLET BY MOUTH EVERY DAY 90 tablet 3   carvedilol  (COREG ) 3.125 MG tablet TAKE 1 TABLET(3.125 MG) BY MOUTH TWICE DAILY 180 tablet 3   chlorhexidine (PERIDEX) 0.12 % solution Use as directed 5 mLs in the mouth or throat 2 (two)  times daily.     CINNAMON PO Take by mouth. AM     gabapentin (NEURONTIN) 100 MG capsule Take 100 mg by mouth 2 (two) times daily.     gabapentin (NEURONTIN) 300 MG capsule Take 300 mg by mouth at bedtime.     lisinopril  (ZESTRIL ) 40 MG tablet Take 1 tablet (40 mg total) by mouth daily. 90 tablet 3   Multiple Vitamin (MULTIVITAMIN) capsule Take 1 capsule by mouth daily. AM     Omega-3 Fatty Acids (FISH OIL) 1000 MG CAPS Take 1 capsule by mouth.     omeprazole (PRILOSEC OTC) 20 MG tablet Take 20 mg by mouth daily.     No current facility-administered medications for this visit.    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  Tobacco Use   Smoking status: Every Day    Current packs/day: 1.50    Average packs/day: 1.5 packs/day for 54.4 years (81.6 ttl pk-yrs)    Types: Cigarettes    Start date: 03/27/1970   Smokeless tobacco: Never   Tobacco comments:    Smokes 1.5-2 packs per day   Substance and Sexual Activity   Alcohol use: Yes    Alcohol/week: 24.0 standard drinks of alcohol    Types: 24 Cans of beer per week    Comment: drinks 4-6 beers daily   Drug use: No   Sexual activity: Not on file  Other Topics Concern   Not on file  Social History Narrative   Not on file   Social Drivers of Health   Financial Resource Strain: Not on file  Food Insecurity: Not on file  Transportation Needs: Not on file  Physical Activity: Not on file  Stress: Not on file  Social Connections: Not on file  Intimate Partner Violence: Not on file    Family History  Problem Relation Age of Onset   Heart disease Mother        Aortic stenosis   Pancreatic cancer Father     Review of Systems:  As stated in the HPI and otherwise negative.   BP (!) 103/59 (BP Location: Left Arm, Patient Position: Sitting, Cuff Size: Large)   Pulse 72   Resp 16   Ht 6' 1 (1.854 m)   Wt 229 lb 9.6 oz (104.1 kg)   SpO2 96%   BMI 30.29 kg/m   Physical Examination: General: Well developed, well nourished, NAD  HEENT: OP clear, mucus membranes moist  SKIN: warm, dry. No rashes. Neuro: No focal deficits  Musculoskeletal: Muscle strength 5/5 all ext  Psychiatric: Mood and affect normal  Neck: No JVD, no carotid bruits, no thyromegaly, no lymphadenopathy.  Lungs:Clear bilaterally, no wheezes, rhonci, crackles Cardiovascular: Regular rate and  rhythm. Loud, harsh, late peaking systolic murmur.  Abdomen:Soft. Bowel sounds present. Non-tender.  Extremities: No lower extremity edema. Pulses are 2 + in the bilateral DP/PT.  EKG:  EKG is not ordered today. The ekg ordered today demonstrates   Echo 05/30/24: 1. Left ventricular ejection fraction, by estimation, is Chase to 60%. The  left ventricle has normal function. The left ventricle has no regional  wall motion abnormalities. There is mild left ventricular hypertrophy.  Left ventricular diastolic parameters  were normal.   2. Right ventricular systolic function is normal. The right ventricular  size is normal.   3. The mitral valve is normal in structure. No evidence of mitral valve  regurgitation.   4. Aortic valve DVI 0.21. The aortic valve is calcified. Aortic valve  regurgitation is mild. Severe aortic valve stenosis. Aortic valve area, by  VTI measures 0.88 cm. Aortic valve mean gradient measures 34.0 mmHg.  Aortic valve Vmax measures 3.95 m/s.   5. The inferior vena cava is normal in size with greater than 50%  respiratory variability, suggesting right atrial pressure of 3 mmHg.   FINDINGS   Left Ventricle: Left ventricular ejection fraction, by estimation, is Chase  to 60%. The left ventricle has normal function. The left ventricle has no  regional wall motion abnormalities. The left ventricular internal cavity  size was normal in size. There is   mild left ventricular hypertrophy. Left ventricular diastolic parameters  were normal.   Right Ventricle: The right ventricular size is normal. No increase in  right ventricular wall thickness. Right ventricular systolic function is  normal.   Left Atrium: Left atrial size was normal in size.   Right Atrium: Right atrial size was normal in size.   Pericardium: There is no evidence of pericardial effusion.   Mitral Valve: The mitral valve is normal in structure. No evidence of  mitral valve regurgitation.   Tricuspid  Valve: The tricuspid valve is normal in structure. Tricuspid  valve regurgitation is not demonstrated.   Aortic Valve: Aortic valve DVI 0.21. The aortic valve is calcified. Aortic  valve regurgitation is mild. Severe aortic stenosis is present. Aortic  valve mean gradient measures 34.0 mmHg. Aortic valve peak gradient  measures 62.4 mmHg. Aortic valve area, by   VTI measures 0.88 cm.   Pulmonic Valve: The pulmonic valve was normal in structure. Pulmonic valve  regurgitation is not visualized.   Aorta: The aortic root is normal in size and structure.   Venous: The inferior vena cava is normal in size with greater than 50%  respiratory variability, suggesting right atrial pressure of 3 mmHg.   IAS/Shunts: No atrial level shunt detected by color flow Doppler.     LEFT VENTRICLE  PLAX 2D  LVIDd:         4.74 cm   Diastology  LVIDs:         3.49 cm   LV e' medial:    8.70 cm/s  LV PW:         1.13 cm   LV E/e' medial:  11.1  LV IVS:        1.12 cm   LV e' lateral:   8.70 cm/s  LVOT diam:     2.30 cm   LV E/e' lateral: 11.1  LV SV:         82  LV SV Index:   36  LVOT Area:     4.15 cm     RIGHT VENTRICLE  RV Basal diam:  2.93 cm  RV Mid diam:    2.18 cm  RV S prime:     12.70 cm/s  TAPSE (M-mode): 3.3 cm   LEFT ATRIUM             Index        RIGHT ATRIUM           Index  LA diam:        3.50 cm 1.54 cm/m   RA Area:  16.50 cm  LA Vol (A2C):   45.0 ml 19.83 ml/m  RA Volume:   41.40 ml  18.25 ml/m  LA Vol (A4C):   36.6 ml 16.13 ml/m  LA Biplane Vol: 40.4 ml 17.81 ml/m   AORTIC VALVE  AV Area (Vmax):    0.85 cm  AV Area (Vmean):   0.86 cm  AV Area (VTI):     0.88 cm  AV Vmax:           395.00 cm/s  AV Vmean:          267.000 cm/s  AV VTI:            0.926 m  AV Peak Grad:      62.4 mmHg  AV Mean Grad:      34.0 mmHg  LVOT Vmax:         80.70 cm/s  LVOT Vmean:        Chase.500 cm/s  LVOT VTI:          0.197 m  LVOT/AV VTI ratio: 0.21    AORTA  Ao Sinus diam:  3.68 cm  Ao Asc diam:   3.70 cm   MITRAL VALVE  MV Area (PHT): 5.38 cm    SHUNTS  MV Decel Time: 141 msec    Systemic VTI:  0.20 m  MV E velocity: 96.90 cm/s  Systemic Diam: 2.30 cm  MV A velocity: 67.60 cm/s  MV E/A ratio:  1.43   Cardiac cath 07/01/24: RIGHT HEART CATH AND CORONARY ANGIOGRAPHY   Conclusion  1.  Normal coronary arteries. 2.  Heavily calcified aortic valve by fluoroscopy with severe stenosis by echo.  I did not attempt to cross the valve. 3.  Right heart catheterization showed mildly elevated right atrial pressure, mild pulmonary hypertension, mildly elevated wedge pressure and normal cardiac output.   Recommendations: Proceed with aortic valve replacement evaluation.  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   Procedural Details  Technical Details Procedural Details: The right antecubital area was prepped in a sterile fashion.  It was anesthetized with 1% lidocaine .  Ultrasound was used to identify the right antecubital vein.  Under direct ultrasound visualization, a 4 French slender sheath was placed.  Ultrasound guidance was also used for right radial artery access and placement of a 5 French slender sheath. 2.5 mg of verapamil  was administered through the sheath, weight-based unfractionated heparin  was administered intravenously. Right heart catheterization was performed using a 5 French Swan-Ganz catheter. Cardiac output was calculated by the Fick method. A Jackie catheter was used for selective coronary angiography.  I did not attempt to cross the aortic valve.  There were no immediate procedural complications. A TR band was used for radial hemostasis at the completion of the procedure.  The patient was transferred to the post catheterization recovery area for further monitoring.   Estimated blood loss <50 mL.   During this procedure medications were administered to achieve and maintain moderate conscious sedation while the patient's heart rate, blood pressure,  and oxygen saturation were continuously monitored and I was present face-to-face 100% of this time. April Traczyk RN and Katlyn Kiger Cardiovascular Specialist are independent, trained observers who assisted in the monitoring of the patient's level of consciousness.   Medications (Filter: Administrations occurring from 0730 to 0828 on 07/01/24)  important  Continuous medications are totaled by the amount administered until 07/01/24 0828.   Heparin  (Porcine) in NaCl 2000-0.9 UNIT/L-% SOLN (mL)  Total volume: 1,000 mL Date/Time Rate/Dose/Volume Action  07/01/24 0738 1,000 mL Given   fentaNYL  (SUBLIMAZE ) injection (mcg)  Total dose: 25 mcg Date/Time Rate/Dose/Volume Action   07/01/24 0748 25 mcg Given   midazolam  (VERSED ) injection (mg)  Total dose: 1 mg Date/Time Rate/Dose/Volume Action   07/01/24 0748 1 mg Given   lidocaine  (PF) (XYLOCAINE ) 1 % injection (mL)  Total volume: 5 mL Date/Time Rate/Dose/Volume Action   07/01/24 0756 5 mL Given   heparin  sodium (porcine) injection (Units)  Total dose: 5,000 Units Date/Time Rate/Dose/Volume Action   07/01/24 0807 5,000 Units Given   verapamil  (ISOPTIN ) injection (mg)  Total dose: 2.5 mg Date/Time Rate/Dose/Volume Action   07/01/24 0759 2.5 mg Given   iohexol  (OMNIPAQUE ) 300 MG/ML solution (mL)  Total volume: 26 mL Date/Time Rate/Dose/Volume Action   07/01/24 0815 26 mL Given    Sedation Time  Sedation Time Physician-1: 23 minutes 38 seconds Contrast     Administrations occurring from 0730 to 0828 on 07/01/24:  Medication Name Total Dose  iohexol  (OMNIPAQUE ) 300 MG/ML solution 26 mL   Radiation/Fluoro  Fluoro time: 4.2 (min) DAP: 15.5 (Gycm2) Cumulative Air Kerma: 210 (mGy) Complications  Complications documented before study signed (07/01/2024  8:32 AM)   No complications were associated with this study.  Documented by Kiger, Katlyn, RT - 07/01/2024  8:12 AM     Coronary Findings  Diagnostic Dominance: Right Left  Main  Vessel is angiographically normal.    Left Circumflex  Vessel is angiographically normal.    Right Coronary Artery  Vessel is angiographically normal.    Right Posterior Descending Artery  Vessel is angiographically normal.    Right Posterior Atrioventricular Artery  Vessel is angiographically normal.    Intervention   No interventions have been documented.   Coronary Diagrams  Diagnostic Dominance: Right  Intervention   Implants   No implant documentation for this case.   Syngo Images   Show images for CARDIAC CATHETERIZATION Images on Long Term Storage   Show images for [place], [place] to Procedure Log  Procedure Log    Hemo Data  Flowsheet Row Most Recent Value  Fick Cardiac Output 5.85 L/min  Fick Cardiac Output Index 2.59 (L/min)/BSA  RA A Wave 18 mmHg  RA V Wave 16 mmHg  RA Mean 14 mmHg  RV Systolic Pressure 40 mmHg  RV Diastolic Pressure 10 mmHg  RV EDP 18 mmHg  PA Systolic Pressure 41 mmHg  PA Diastolic Pressure 21 mmHg  PA Mean 28 mmHg  PW A Wave 24 mmHg  PW V Wave 22 mmHg  PW Mean 20 mmHg  AO Systolic Pressure 16 mmHg  AO Diastolic Pressure -19 mmHg  AO Mean -2 mmHg  QP/QS 1  TPVR Index 10.83 HRUI  TSVR Index 38.26 HRUI  PVR SVR Ratio 0.09  TPVR/TSVR Ratio 0.28   Cardiac CT 07/20/24: FINDINGS: Vascular: Aortic atherosclerosis. The ascending aorta is dilated at 4.2 cm.   Mediastinum/nodes: No adenopathy or mass. Unremarkable esophagus.   Lungs: No focal airspace disease. No pulmonary nodule. No pleural fluid. The included airways are patent.   Upper abdomen: No acute findings. 13 mm enhancing focus in the left lobe of the liver, better assessed on same-day abdominopelvic CTA. Some of the additional hypervascular foci are faintly visualized.   Musculoskeletal: There are no acute or suspicious osseous abnormalities. Stable Schmorl's node in the superior endplate of T12.   IMPRESSION: 1. Dilated ascending aorta at 4.2 cm.  Recommend annual imaging followup by CTA or MRA. This recommendation follows 2010 ACCF/AHA/AATS/ACR/ASA/SCA/SCAI/SIR/STS/SVM  Guidelines for the Diagnosis and Management of Patients with Thoracic Aortic Disease. Circulation. 2010; 121: Z733-z630. Aortic aneurysm NOS (ICD10-I71.9) 2.  Aortic Atherosclerosis (ICD10-I70.0).     Electronically Signed   By: Andrea Gasman M.D.   On: 07/29/2024 15:29    Addended by Gasman Andrea NOVAK, MD on 07/29/2024  3:32 PM    Study Result  Narrative & Impression  CLINICAL DATA:  Severe Aortic Stenosis.   EXAM: Cardiac TAVR CT   TECHNIQUE: A non-contrast, gated CT scan was obtained with axial slices of 2.5 mm through the heart for aortic valve scoring. A 100 kV retrospective, gated, contrast cardiac scan was obtained. Gantry rotation speed was 230 msec and collimation was 0.63 mm. Nitroglycerin was not given. The 3D dataset was reconstructed in systole with motion correction. The 3D data set was reconstructed in 5% intervals of the 0-95% of the R-R cycle. Systolic and diastolic phases were analyzed on a dedicated workstation using MPR, MIP, and VRT modes. The patient received 100 cc of contrast.   FINDINGS: Image quality: Excellent.   Noise artifact is: Limited.   Valve Morphology: Bicuspid aortic valve with fusion of the RCC/LCC with raphe. Severely calcified leaflets with restricted leaflet motion in systole. Bulky calcification of the NCC and LCC.   Aortic Valve Calcium  score: 5697   Aortic annular dimension:   Phase assessed: 30%   Annular area: 699 mm2   Annular perimeter: 95.6 mm   Max diameter: 33.1 mm   Min diameter: 27.5 mm   Annular and subannular calcification: Minimal annular calcium  under the RCC/LCC/NCC.   Membranous septum length: 9.3 mm   Optimal coplanar projection: LAO 12 CAU 4   Coronary Artery Height above Annulus:   Left Main: 7.2 mm   Right Coronary: 9.7 mm   Sinus of Valsalva Measurements:    Non-coronary: 42 mm   Right-coronary: 40 mm   Left-coronary: 40 mm   Max diameter: 45 mm RCC to LCC.   Sinus of Valsalva Height:   Non-coronary: 28.1 mm   Right-coronary: 19.0 mm   Left-coronary: 20.2 mm   Sinotubular Junction: 38 mm   Ascending Thoracic Aorta: 41 mm   Coronary Arteries: Normal coronary origin. Right dominance. The study was performed without use of NTG and is insufficient for plaque evaluation. Please refer to recent cardiac catheterization for coronary assessment. 3-vessel coronary calcifications noted.   Cardiac Morphology:   Right Atrium: Right atrial size is within normal limits.   Right Ventricle: The right ventricular cavity is within normal limits.   Left Atrium: Left atrial size is normal in size with no left atrial appendage filling defect.   Left Ventricle: The ventricular cavity size is within normal limits.   Pulmonary arteries: Normal in size without proximal filling defect.   Pulmonary veins: Normal pulmonary venous drainage.   Pericardium: Thickened pericardium up to 8 mm anteriorly. No calcifications. Correlate clinically with symptoms of pericarditis.   Mitral Valve: The mitral valve is normal structure without significant calcification.   Extra-cardiac findings: See attached radiology report for non-cardiac structures.   IMPRESSION: 1. Bicuspid aortic valve with fusion of the RCC/LCC with raphe. Bulky calcification of the NCC and LCC. AoV calcium  score 5697.   2. Annular measurements outside of 29 mm S3. Structural heart team discussion recommended.   3. Low coronary artery heights noted (LM 7.2 mm).   4. Optimal Fluoroscopic Angle for Delivery: LAO 12 CAU 4   5. 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 42 mm commissure to NCC. Ascending aorta up to 41 mm.   6. Thickened pericardium up to 8 mm anteriorly. No pericardial calcifications. Correlate clinically with symptoms  of pericarditis.    Recent Labs: 06/23/2024: ALT 25; BUN 13; Creatinine, Ser 0.98; Platelets 194 07/01/2024: Hemoglobin 13.9; Potassium 4.3; Sodium 140    Wt Readings from Last 3 Encounters:  08/25/24 229 lb 9.6 oz (104.1 kg)  07/14/24 230 lb (104.3 kg)  07/13/24 230 lb (104.3 kg)     Assessment and Plan:   1. Severe Aortic Valve Stenosis: He has severe aortic valve stenosis. NYHA class 2 symptoms. I have personally reviewed the echo images. The aortic valve is thickened and calcified with limited leaflet mobility. I think he would benefit from AVR. He has been seen in the CT surgery office by Dr. Daniel and she feels that TAVR is the better option for this patient. He has been scheduled for TAVR from the transfemoral approach on 09/06/24.    I have reviewed the natural history of aortic stenosis with the patient and their family members  who are present today. We have discussed the limitations of medical therapy and the poor prognosis associated with symptomatic aortic stenosis. We have reviewed potential treatment options, including palliative medical therapy, conventional surgical aortic valve replacement, and transcatheter aortic valve replacement. We discussed treatment options in the context of the patient's specific comorbid medical conditions.   Proceed with TAVR on 09/06/24 with placement of a 29 mm Edwards Sapien 3 Ultra Resilia THV from the transfemoral approach.     Labs/ tests ordered today include:  No orders of the defined types were placed in this encounter.  Disposition:   F/U will be arranged with the structural team  Signed, Lonni Cash, MD, [facility] 08/25/2024 2:57 PM    [facility] [address] [place], [place], [place]  [address] Phone: [phone]; Fax: [phone]

## 2024-08-25 NOTE — Progress Notes (Signed)
 Pre Surgical Assessment: 5 M Walk Test  34M=16.65ft  5 Meter Walk Test- trial 1: 4.34 seconds 5 Meter Walk Test- trial 2: 4.33 seconds 5 Meter Walk Test- trial 3: 3.98 seconds 5 Meter Walk Test Average: 4.22 seconds

## 2024-08-25 NOTE — Patient Instructions (Signed)
 Medication Instructions:  Your physician recommends that you continue on your current medications as directed. Please refer to the Current Medication list given to you today.  *If you need a refill on your cardiac medications before your next appointment, please call your pharmacy*  Lab Work: none If you have labs (blood work) drawn today and your tests are completely normal, you will receive your results only by: MyChart Message (if you have MyChart) OR A paper copy in the mail If you have any lab test that is abnormal or we need to change your treatment, we will call you to review the results.  Testing/Procedures: TAVR scheduled for 11/18  Follow-Up: At [facility], you and your health needs are our priority.  As part of our continuing mission to provide you with exceptional heart care, our providers are all part of one team.  This team includes your primary Cardiologist (physician) and Advanced Practice Providers or APPs (Physician Assistants and Nurse Practitioners) who all work together to provide you with the care you need, when you need it.  Your next appointment:   To be arranged after procedure  Provider:   Izetta Hummer, PA-C    We recommend signing up for the patient portal called MyChart.  Sign up information is provided on this After Visit Summary.  MyChart is used to connect with patients for Virtual Visits (Telemedicine).  Patients are able to view lab/test results, encounter notes, upcoming appointments, etc.  Non-urgent messages can be sent to your provider as well.   To learn more about what you can do with MyChart, go to [email or web address].   Other Instructions

## 2024-09-02 ENCOUNTER — Encounter (HOSPITAL_COMMUNITY)
Admission: RE | Admit: 2024-09-02 | Discharge: 2024-09-02 | Disposition: A | Discharge status: Home / Self Care | Source: Ambulatory Visit | Attending: Cardiovascular Disease | Admitting: Cardiovascular Disease

## 2024-09-02 ENCOUNTER — Other Ambulatory Visit: Payer: Self-pay

## 2024-09-02 ENCOUNTER — Ambulatory Visit (HOSPITAL_COMMUNITY)
Admission: RE | Admit: 2024-09-02 | Discharge: 2024-09-02 | Disposition: A | Discharge status: Home / Self Care | Source: Ambulatory Visit | Attending: Cardiovascular Disease | Admitting: Cardiovascular Disease

## 2024-09-02 DIAGNOSIS — I350 Nonrheumatic aortic (valve) stenosis: Secondary | ICD-10-CM | POA: Insufficient documentation

## 2024-09-02 DIAGNOSIS — Z01818 Encounter for other preprocedural examination: Secondary | ICD-10-CM | POA: Insufficient documentation

## 2024-09-02 DIAGNOSIS — I77810 Thoracic aortic ectasia: Secondary | ICD-10-CM | POA: Diagnosis not present

## 2024-09-02 LAB — TYPE AND SCREEN
ABO/RH(D): O POS
Antibody Screen: NEGATIVE

## 2024-09-02 LAB — CBC
HCT: 41.9 % (ref 39.0–52.0)
Hemoglobin: 14.1 g/dL (ref 13.0–17.0)
MCH: 33.4 pg (ref 26.0–34.0)
MCHC: 33.7 g/dL (ref 30.0–36.0)
MCV: 99.3 fL (ref 80.0–100.0)
Platelets: 196 K/uL (ref 150–400)
RBC: 4.22 MIL/uL (ref 4.22–5.81)
RDW: 13 % (ref 11.5–15.5)
WBC: 5 K/uL (ref 4.0–10.5)
nRBC: 0 % (ref 0.0–0.2)

## 2024-09-02 LAB — COMPREHENSIVE METABOLIC PANEL WITH GFR
ALT: 26 U/L (ref 0–44)
AST: 21 U/L (ref 15–41)
Albumin: 3.7 g/dL (ref 3.5–5.0)
Alkaline Phosphatase: 47 U/L (ref 38–126)
Anion gap: 10 (ref 5–15)
BUN: 13 mg/dL (ref 8–23)
CO2: 26 mmol/L (ref 22–32)
Calcium: 8.7 mg/dL — ABNORMAL LOW (ref 8.9–10.3)
Chloride: 103 mmol/L (ref 98–111)
Creatinine, Ser: 0.92 mg/dL (ref 0.61–1.24)
GFR, Estimated: >60 mL/min (ref >60)
Glucose, Bld: 107 mg/dL — ABNORMAL HIGH (ref 70–99)
Potassium: 4.2 mmol/L (ref 3.5–5.1)
Sodium: 139 mmol/L (ref 135–145)
Total Bilirubin: 1.4 mg/dL — ABNORMAL HIGH (ref 0.0–1.2)
Total Protein: 6.5 g/dL (ref 6.5–8.1)

## 2024-09-02 LAB — URINALYSIS, ROUTINE W REFLEX MICROSCOPIC
Bilirubin Urine: NEGATIVE
Glucose, UA: NEGATIVE mg/dL
Hgb urine dipstick: NEGATIVE
Ketones, ur: NEGATIVE mg/dL
Leukocytes,Ua: NEGATIVE
Nitrite: NEGATIVE
Protein, ur: NEGATIVE mg/dL
Specific Gravity, Urine: 1.006 (ref 1.005–1.030)
pH: 6 (ref 5.0–8.0)

## 2024-09-02 LAB — PROTIME-INR
INR: 1 (ref 0.8–1.2)
Prothrombin Time: 13.9 s (ref 11.4–15.2)

## 2024-09-02 LAB — SURGICAL PCR SCREEN
MRSA, PCR: NEGATIVE
Staphylococcus aureus: NEGATIVE

## 2024-09-02 NOTE — Progress Notes (Signed)
 All consents signed by patient at PAT lab appointment. Pt was sent home with printed copy of surgical instructions and CHG soap/CHG soap instructions. All instructions reviewed with patient and questions answered.  Patients chart send to anesthesia for review. Pt denies any respiratory illness/infection in the last two months.

## 2024-09-05 ENCOUNTER — Other Ambulatory Visit: Payer: Self-pay | Admitting: Physician Assistant

## 2024-09-05 DIAGNOSIS — Z952 Presence of prosthetic heart valve: Secondary | ICD-10-CM

## 2024-09-05 MED ORDER — POTASSIUM CHLORIDE 2 MEQ/ML IV SOLN
80.0000 meq | INTRAVENOUS | Status: DC
  Filled 2024-09-05 (×2): qty 40

## 2024-09-05 MED ORDER — NOREPINEPHRINE 4 MG/250ML-% IV SOLN
0.0000 ug/min | INTRAVENOUS | Status: AC
  Administered 2024-09-06: 1 ug/min via INTRAVENOUS
  Filled 2024-09-05: qty 250

## 2024-09-05 MED ORDER — DEXMEDETOMIDINE HCL IN NACL 400 MCG/100ML IV SOLN
0.1000 ug/kg/h | INTRAVENOUS | Status: AC
  Administered 2024-09-06: .7 ug/kg/h via INTRAVENOUS
  Administered 2024-09-06: 104.12 ug via INTRAVENOUS
  Filled 2024-09-05: qty 100

## 2024-09-05 MED ORDER — MAGNESIUM SULFATE 50 % IJ SOLN
40.0000 meq | INTRAMUSCULAR | Status: DC
  Filled 2024-09-05 (×2): qty 9.85

## 2024-09-05 MED ORDER — CEFAZOLIN SODIUM-DEXTROSE 2-4 GM/100ML-% IV SOLN
2.0000 g | INTRAVENOUS | Status: AC
  Administered 2024-09-06: 2 g via INTRAVENOUS
  Filled 2024-09-05: qty 100

## 2024-09-05 MED ORDER — HEPARIN 30,000 UNITS/1000 ML (OHS) CELLSAVER SOLUTION
Status: DC
  Filled 2024-09-05 (×2): qty 1000

## 2024-09-06 ENCOUNTER — Encounter (HOSPITAL_COMMUNITY): Payer: Self-pay | Admitting: Cardiovascular Disease

## 2024-09-06 ENCOUNTER — Inpatient Hospital Stay (HOSPITAL_COMMUNITY): Admitting: Certified Registered Nurse Anesthetist

## 2024-09-06 ENCOUNTER — Inpatient Hospital Stay (HOSPITAL_COMMUNITY): Admission: RE | Disposition: A | Payer: Self-pay | Source: Home / Self Care | Attending: Cardiovascular Disease

## 2024-09-06 ENCOUNTER — Inpatient Hospital Stay (HOSPITAL_COMMUNITY)

## 2024-09-06 ENCOUNTER — Inpatient Hospital Stay (HOSPITAL_COMMUNITY)
Admission: RE | Admit: 2024-09-06 | Discharge: 2024-09-07 | DRG: 267 | Disposition: A | Discharge status: Home / Self Care | Attending: Cardiovascular Disease | Admitting: Cardiovascular Disease

## 2024-09-06 ENCOUNTER — Inpatient Hospital Stay (HOSPITAL_COMMUNITY): Payer: Self-pay | Admitting: Physician Assistant

## 2024-09-06 ENCOUNTER — Other Ambulatory Visit: Payer: Self-pay

## 2024-09-06 DIAGNOSIS — Z952 Presence of prosthetic heart valve: Secondary | ICD-10-CM | POA: Diagnosis not present

## 2024-09-06 DIAGNOSIS — I10 Essential (primary) hypertension: Secondary | ICD-10-CM

## 2024-09-06 DIAGNOSIS — E669 Obesity, unspecified: Secondary | ICD-10-CM | POA: Diagnosis present

## 2024-09-06 DIAGNOSIS — Z006 Encounter for examination for normal comparison and control in clinical research program: Secondary | ICD-10-CM | POA: Diagnosis not present

## 2024-09-06 DIAGNOSIS — I350 Nonrheumatic aortic (valve) stenosis: Principal | ICD-10-CM | POA: Diagnosis present

## 2024-09-06 DIAGNOSIS — Z6830 Body mass index (BMI) 30.0-30.9, adult: Secondary | ICD-10-CM

## 2024-09-06 DIAGNOSIS — F1010 Alcohol abuse, uncomplicated: Secondary | ICD-10-CM | POA: Diagnosis present

## 2024-09-06 DIAGNOSIS — Z720 Tobacco use: Secondary | ICD-10-CM | POA: Diagnosis present

## 2024-09-06 DIAGNOSIS — K769 Liver disease, unspecified: Secondary | ICD-10-CM | POA: Diagnosis not present

## 2024-09-06 DIAGNOSIS — E785 Hyperlipidemia, unspecified: Secondary | ICD-10-CM | POA: Diagnosis present

## 2024-09-06 DIAGNOSIS — I2510 Atherosclerotic heart disease of native coronary artery without angina pectoris: Secondary | ICD-10-CM | POA: Diagnosis not present

## 2024-09-06 DIAGNOSIS — Z79899 Other long term (current) drug therapy: Secondary | ICD-10-CM | POA: Diagnosis not present

## 2024-09-06 DIAGNOSIS — F17210 Nicotine dependence, cigarettes, uncomplicated: Secondary | ICD-10-CM

## 2024-09-06 DIAGNOSIS — Z8679 Personal history of other diseases of the circulatory system: Secondary | ICD-10-CM

## 2024-09-06 DIAGNOSIS — I77810 Thoracic aortic ectasia: Secondary | ICD-10-CM | POA: Diagnosis present

## 2024-09-06 HISTORY — PX: INTRAOPERATIVE TRANSTHORACIC ECHOCARDIOGRAM: SHX6523

## 2024-09-06 HISTORY — PX: TRANSCATHETER AORTIC VALVE REPLACEMENT, TRANSFEMORAL: SHX6400

## 2024-09-06 LAB — ECHOCARDIOGRAM LIMITED
AR max vel: 5.46 cm2
AV Area VTI: 5.56 cm2
AV Area mean vel: 6.87 cm2
AV Mean grad: 3 mmHg
AV Peak grad: 6.5 mmHg
Ao pk vel: 1.27 m/s
Calc EF: 58 %
Est EF: 55
S' Lateral: 2.8 cm
Single Plane A2C EF: 59.8 %
Single Plane A4C EF: 59.2 %

## 2024-09-06 LAB — ABO/RH: ABO/RH(D): O POS

## 2024-09-06 SURGERY — TRANSCATHETER AORTIC VALVE REPLACEMENT, TRANSFEMORAL (CATHLAB)
Anesthesia: Monitor Anesthesia Care

## 2024-09-06 MED ORDER — ONDANSETRON HCL 4 MG/2ML IJ SOLN: 4.0000 mg | Freq: Four times a day (QID) | INTRAMUSCULAR | Status: DC | PRN

## 2024-09-06 MED ORDER — GABAPENTIN 300 MG PO CAPS
300.0000 mg | ORAL_CAPSULE | Freq: Every day | ORAL | Status: DC
  Administered 2024-09-06: 300 mg via ORAL
  Filled 2024-09-06: qty 1

## 2024-09-06 MED ORDER — PHENYLEPHRINE 80 MCG/ML (10ML) SYRINGE FOR IV PUSH (FOR BLOOD PRESSURE SUPPORT)
PREFILLED_SYRINGE | INTRAVENOUS | Status: DC | PRN
  Administered 2024-09-06: 40 ug via INTRAVENOUS

## 2024-09-06 MED ORDER — LIDOCAINE HCL (PF) 1 % IJ SOLN
INTRAMUSCULAR | Status: DC | PRN
  Administered 2024-09-06: 5 mL

## 2024-09-06 MED ORDER — TRAMADOL HCL 50 MG PO TABS: 50.0000 mg | ORAL_TABLET | ORAL | Status: DC | PRN

## 2024-09-06 MED ORDER — CHLORHEXIDINE GLUCONATE 4 % EX SOLN
30.0000 mL | CUTANEOUS | Status: DC
  Filled 2024-09-06: qty 30

## 2024-09-06 MED ORDER — PROPOFOL 10 MG/ML IV BOLUS
INTRAVENOUS | Status: DC | PRN
  Administered 2024-09-06 (×2): 10 mg via INTRAVENOUS
  Administered 2024-09-06: 20 mg via INTRAVENOUS

## 2024-09-06 MED ORDER — NOREPINEPHRINE 4 MG/250ML-% IV SOLN
0.0000 ug/min | INTRAVENOUS | Status: DC
  Filled 2024-09-06: qty 250

## 2024-09-06 MED ORDER — ASPIRIN 81 MG PO TBEC
81.0000 mg | DELAYED_RELEASE_TABLET | Freq: Every evening | ORAL | Status: DC
  Administered 2024-09-06: 81 mg via ORAL
  Filled 2024-09-06: qty 1

## 2024-09-06 MED ORDER — FENTANYL CITRATE (PF) 100 MCG/2ML IJ SOLN
INTRAMUSCULAR | Status: AC
  Filled 2024-09-06: qty 2

## 2024-09-06 MED ORDER — HEPARIN SODIUM (PORCINE) 1000 UNIT/ML IJ SOLN
INTRAMUSCULAR | Status: DC | PRN
  Administered 2024-09-06: 15600 [IU] via INTRAVENOUS

## 2024-09-06 MED ORDER — PROTAMINE SULFATE 10 MG/ML IV SOLN
INTRAVENOUS | Status: DC | PRN
  Administered 2024-09-06: 150 mg via INTRAVENOUS

## 2024-09-06 MED ORDER — NITROGLYCERIN IN D5W 200-5 MCG/ML-% IV SOLN: 0.0000 ug/min | INTRAVENOUS | Status: DC

## 2024-09-06 MED ORDER — CHLORHEXIDINE GLUCONATE 4 % EX SOLN
60.0000 mL | Freq: Once | CUTANEOUS | Status: DC
  Filled 2024-09-06: qty 60

## 2024-09-06 MED ORDER — IODIXANOL 320 MG/ML IV SOLN
INTRAVENOUS | Status: DC | PRN
  Administered 2024-09-06: 50 mL

## 2024-09-06 MED ORDER — PANTOPRAZOLE SODIUM 40 MG PO TBEC
40.0000 mg | DELAYED_RELEASE_TABLET | Freq: Every day | ORAL | Status: DC
  Administered 2024-09-06: 40 mg via ORAL
  Filled 2024-09-06: qty 1

## 2024-09-06 MED ORDER — GABAPENTIN 100 MG PO CAPS
100.0000 mg | ORAL_CAPSULE | Freq: Two times a day (BID) | ORAL | Status: DC
  Administered 2024-09-06 – 2024-09-07 (×2): 100 mg via ORAL
  Filled 2024-09-06 (×2): qty 1

## 2024-09-06 MED ORDER — LACTATED RINGERS IV SOLN: INTRAVENOUS | Status: DC | PRN

## 2024-09-06 MED ORDER — ACETAMINOPHEN 325 MG PO TABS: 650.0000 mg | ORAL_TABLET | Freq: Four times a day (QID) | ORAL | Status: DC | PRN

## 2024-09-06 MED ORDER — SODIUM CHLORIDE 0.9% FLUSH
3.0000 mL | Freq: Two times a day (BID) | INTRAVENOUS | Status: DC
  Administered 2024-09-06 – 2024-09-07 (×2): 3 mL via INTRAVENOUS

## 2024-09-06 MED ORDER — ATORVASTATIN CALCIUM 40 MG PO TABS
40.0000 mg | ORAL_TABLET | Freq: Every evening | ORAL | Status: DC
  Administered 2024-09-06: 40 mg via ORAL
  Filled 2024-09-06: qty 1

## 2024-09-06 MED ORDER — PROPOFOL 500 MG/50ML IV EMUL
INTRAVENOUS | Status: DC | PRN
  Administered 2024-09-06: 25 ug/kg/min via INTRAVENOUS

## 2024-09-06 MED ORDER — SODIUM CHLORIDE 0.9% FLUSH: 3.0000 mL | INTRAVENOUS | Status: DC | PRN

## 2024-09-06 MED ORDER — MORPHINE SULFATE (PF) 2 MG/ML IV SOLN: 1.0000 mg | INTRAVENOUS | Status: DC | PRN

## 2024-09-06 MED ORDER — MIDAZOLAM HCL 5 MG/5ML IJ SOLN
INTRAMUSCULAR | Status: DC | PRN
  Administered 2024-09-06: 1 mg via INTRAVENOUS

## 2024-09-06 MED ORDER — SODIUM CHLORIDE 0.9 % IV SOLN: INTRAVENOUS | Status: DC

## 2024-09-06 MED ORDER — GLYCOPYRROLATE 0.2 MG/ML IJ SOLN
INTRAMUSCULAR | Status: DC | PRN
  Administered 2024-09-06 (×2): .1 mg via INTRAVENOUS

## 2024-09-06 MED ORDER — SODIUM CHLORIDE 0.9 % IV SOLN: INTRAVENOUS | Status: AC

## 2024-09-06 MED ORDER — AMISULPRIDE (ANTIEMETIC) 5 MG/2ML IV SOLN: 10.0000 mg | Freq: Once | INTRAVENOUS | Status: DC | PRN

## 2024-09-06 MED ORDER — CEFAZOLIN SODIUM-DEXTROSE 2-4 GM/100ML-% IV SOLN
2.0000 g | Freq: Three times a day (TID) | INTRAVENOUS | Status: AC
  Administered 2024-09-06 – 2024-09-07 (×2): 2 g via INTRAVENOUS
  Filled 2024-09-06 (×2): qty 100

## 2024-09-06 MED ORDER — MIDAZOLAM HCL 2 MG/2ML IJ SOLN
INTRAMUSCULAR | Status: AC
  Filled 2024-09-06: qty 2

## 2024-09-06 MED ORDER — OXYCODONE HCL 5 MG PO TABS: 5.0000 mg | ORAL_TABLET | ORAL | Status: DC | PRN

## 2024-09-06 MED ORDER — OMEPRAZOLE MAGNESIUM 20 MG PO TBEC: 20.0000 mg | DELAYED_RELEASE_TABLET | Freq: Every evening | ORAL | Status: DC

## 2024-09-06 MED ORDER — CHLORHEXIDINE GLUCONATE 0.12 % MT SOLN
15.0000 mL | Freq: Once | OROMUCOSAL | Status: AC
  Administered 2024-09-06: 15 mL via OROMUCOSAL

## 2024-09-06 MED ORDER — LIDOCAINE HCL (PF) 1 % IJ SOLN
INTRAMUSCULAR | Status: AC
  Filled 2024-09-06: qty 30

## 2024-09-06 MED ORDER — ONDANSETRON HCL 4 MG/2ML IJ SOLN
INTRAMUSCULAR | Status: DC | PRN
  Administered 2024-09-06: 4 mg via INTRAVENOUS

## 2024-09-06 MED ORDER — ACETAMINOPHEN 650 MG RE SUPP: 650.0000 mg | Freq: Four times a day (QID) | RECTAL | Status: DC | PRN

## 2024-09-06 MED ORDER — ONDANSETRON HCL 4 MG/2ML IJ SOLN: 4.0000 mg | Freq: Once | INTRAMUSCULAR | Status: DC | PRN

## 2024-09-06 MED ORDER — SODIUM CHLORIDE 0.9 % IV SOLN: 250.0000 mL | INTRAVENOUS | Status: DC | PRN

## 2024-09-06 MED ORDER — FENTANYL CITRATE (PF) 100 MCG/2ML IJ SOLN
INTRAMUSCULAR | Status: DC | PRN
  Administered 2024-09-06: 25 ug via INTRAVENOUS

## 2024-09-06 SURGICAL SUPPLY — 26 items
CABLE ADAPT PACING TEMP 12FT (ADAPTER) IMPLANT
CATH COMMANDER DELIVERY SYS 29 (CATHETERS) IMPLANT
CATH DIAG 6FR PIGTAIL ANGLED (CATHETERS) IMPLANT
CATH INFINITI 5FR ANG PIGTAIL (CATHETERS) IMPLANT
CATH INFINITI 6F AL1 (CATHETERS) IMPLANT
CATH S G BIP PACING (CATHETERS) IMPLANT
CLOSURE MYNX CONTROL 6F/7F (Vascular Products) IMPLANT
CLOSURE PERCLOSE PROSTYLE (Vascular Products) IMPLANT
CRIMPER (MISCELLANEOUS) IMPLANT
DEVICE INFLATION ATRION QL38 (MISCELLANEOUS) IMPLANT
KIT MICROPUNCTURE NIT STIFF (SHEATH) IMPLANT
KIT SAPIAN 3 ULTRA RESILIA 29 (Valve) IMPLANT
PACK CARDIAC CATHETERIZATION (CUSTOM PROCEDURE TRAY) ×1 IMPLANT
SET ATX-X65L (MISCELLANEOUS) IMPLANT
SHEATH BRITE TIP 7FR 35CM (SHEATH) IMPLANT
SHEATH INTRODUCER SET 29 (SHEATH) IMPLANT
SHEATH PINNACLE 6F 10CM (SHEATH) IMPLANT
SHEATH PINNACLE 8F 10CM (SHEATH) IMPLANT
SHIELD CATH-GARD CONTAMINATION (MISCELLANEOUS) IMPLANT
TRANSDUCER W/STOPCOCK (MISCELLANEOUS) IMPLANT
TUBING ART PRESS 72 MALE/FEM (TUBING) IMPLANT
WIRE AMPLATZ SS-J .035X260CM (WIRE) IMPLANT
WIRE EMERALD 3MM-J .035X150CM (WIRE) IMPLANT
WIRE EMERALD 3MM-J .035X260CM (WIRE) IMPLANT
WIRE EMERALD ST .035X260CM (WIRE) IMPLANT
WIRE SAFARI SM CURVE 275 (WIRE) IMPLANT

## 2024-09-06 NOTE — Anesthesia Preprocedure Evaluation (Addendum)
 Anesthesia Evaluation  Patient identified by MRN, date of birth, ID band Patient awake    Reviewed: Allergy & Precautions, NPO status , Patient's Chart, lab work & pertinent test results  Airway Mallampati: III  TM Distance: >3 FB Neck ROM: Full    Dental  (+) Edentulous Upper, Missing,    Pulmonary Current Smoker and Patient abstained from smoking.    + decreased breath sounds      Cardiovascular hypertension, Pt. on medications and Pt. on home beta blockers + Valvular Problems/Murmurs AS  Rhythm:Regular Rate:Normal + Systolic murmurs Echo:   1. Left ventricular ejection fraction, by estimation, is 55 to 60%. The  left ventricle has normal function. The left ventricle has no regional  wall motion abnormalities. There is mild left ventricular hypertrophy.  Left ventricular diastolic parameters  were normal.   2. Right ventricular systolic function is normal. The right ventricular  size is normal.   3. The mitral valve is normal in structure. No evidence of mitral valve  regurgitation.   4. Aortic valve DVI 0.21. The aortic valve is calcified. Aortic valve  regurgitation is mild. Severe aortic valve stenosis. Aortic valve area, by  VTI measures 0.88 cm. Aortic valve mean gradient measures 34.0 mmHg.  Aortic valve Vmax measures 3.95 m/s.   5. The inferior vena cava is normal in size with greater than 50%  respiratory variability, suggesting right atrial pressure of 3 mmHg.     Neuro/Psych  PSYCHIATRIC DISORDERS      negative neurological ROS     GI/Hepatic Neg liver ROS,GERD  Medicated,,  Endo/Other  negative endocrine ROS    Renal/GU negative Renal ROS     Musculoskeletal  (+) Arthritis ,    Abdominal   Peds  Hematology negative hematology ROS (+)   Anesthesia Other Findings   Reproductive/Obstetrics                              Anesthesia Physical Anesthesia Plan  ASA:  4  Anesthesia Plan: MAC   Post-op Pain Management: Minimal or no pain anticipated   Induction: Intravenous  PONV Risk Score and Plan: 0 and Propofol  infusion  Airway Management Planned: Natural Airway and Nasal Cannula  Additional Equipment: None  Intra-op Plan:   Post-operative Plan:   Informed Consent: I have reviewed the patients History and Physical, chart, labs and discussed the procedure including the risks, benefits and alternatives for the proposed anesthesia with the patient or authorized representative who has indicated his/her understanding and acceptance.     Dental advisory given  Plan Discussed with: CRNA  Anesthesia Plan Comments:          Anesthesia Quick Evaluation

## 2024-09-06 NOTE — Anesthesia Procedure Notes (Signed)
 Arterial Line Insertion Start/End11/18/2025 9:15 AM, 09/06/2024 9:20 AM Performed by: Zelphia Norleen HERO, CRNA, CRNA  Patient location: Pre-op. Preanesthetic checklist: patient identified, IV checked, site marked, risks and benefits discussed, surgical consent, monitors and equipment checked, pre-op evaluation, timeout performed and anesthesia consent Lidocaine  1% used for infiltration Left, radial was placed Catheter size: 20 G Hand hygiene performed  and maximum sterile barriers used   Attempts: 2 Procedure performed using ultrasound to evaluate access site. Ultrasound Notes:relevant anatomy identified, ultrasound used to visualize needle entry and vessel patent under ultrasound. Following insertion, dressing applied and Biopatch. Post procedure assessment: normal and unchanged  Post procedure complications: unsuccessful attempts. Patient tolerated the procedure well with no immediate complications.

## 2024-09-06 NOTE — CV Procedure (Signed)
 [facility] VASC[facility]  TAVR OPERATIVE NOTE   Date of Procedure:  09/06/2024  Preoperative Diagnosis: Severe Aortic Stenosis   Postoperative Diagnosis: Same   Procedure:   Transcatheter Aortic Valve Replacement - Transfemoral Approach  Edwards Sapien 3 Ultra Resilia THV (size 29 mm, model # [id number], serial # [id number] )   Co-Surgeons:  Lonni Cash, MD and Con Clunes, MD   Anesthesiologist:  [place]  Echocardiographer:  Delford  Pre-operative Echo Findings: Severe aortic stenosis Normal left ventricular systolic function  Post-operative Echo Findings: No paravalvular leak Normal left ventricular systolic function  BRIEF CLINICAL NOTE AND INDICATIONS FOR SURGERY  68 yo male with history of coronary artery calcification, AAA, ascending thoracic aortic aneurysm, HTN, HLD, ongoing alcohol and tobacco abuse, obesity and severe aortic stenosis. Cardiac cath in September 2025 with no evidence of CAD. Mildly elevated right heart pressures. Cardiac CT with AV calcium  score of 5697. Bicuspid aortic valve with annular area of 699 mm2 suitable for 29 mm Edwards Sapien Ultra Resilia THV with extra volume. Mild dilation of the aortic root. He has transfemoral access. He was seen in the CT surgery clinic on 07/14/24 by Dr. Clunes and while he is felt to be a candidate for surgery, the best plan for AVR is felt to be TAVR after discussion with the patient. Plan is to follow his dilated aortic root. He has had progressive dyspnea and fatigue.   During the course of the patient's preoperative work up they have been evaluated comprehensively by a multidisciplinary team of specialists coordinated through the Multidisciplinary Heart Valve Clinic in the [facility].  They have been demonstrated to suffer from symptomatic severe aortic stenosis as noted above. The patient has been counseled extensively as to the relative risks and benefits of all options for the treatment of  severe aortic stenosis including long term medical therapy, conventional surgery for aortic valve replacement, and transcatheter aortic valve replacement.  The patient has been independently evaluated by Dr. Clunes with CT surgery and they are felt to be at high risk for conventional surgical aortic valve replacement. The surgeon indicated the patient would be a poor candidate for conventional surgery. Based upon review of all of the patient's preoperative diagnostic tests they are felt to be candidate for transcatheter aortic valve replacement using the transfemoral approach as an alternative to high risk conventional surgery.    Following the decision to proceed with transcatheter aortic valve replacement, a discussion has been held regarding what types of management strategies would be attempted intraoperatively in the event of life-threatening complications, including whether or not the patient would be considered a candidate for the use of cardiopulmonary bypass and/or conversion to open sternotomy for attempted surgical intervention.  The patient has been advised of a variety of complications that might develop peculiar to this approach including but not limited to risks of death, stroke, paravalvular leak, aortic dissection or other major vascular complications, aortic annulus rupture, device embolization, cardiac rupture or perforation, acute myocardial infarction, arrhythmia, heart block or bradycardia requiring permanent pacemaker placement, congestive heart failure, respiratory failure, renal failure, pneumonia, infection, other late complications related to structural valve deterioration or migration, or other complications that might ultimately cause a temporary or permanent loss of functional independence or other long term morbidity.  The patient provides full informed consent for the procedure as described and all questions were answered preoperatively.    DETAILS OF THE OPERATIVE  PROCEDURE  PREPARATION:   The patient is brought  to the operating room on the above mentioned date and central monitoring was established by the anesthesia team including placement of a radial arterial line. The patient is placed in the supine position on the operating table.  Intravenous antibiotics are administered. Conscious sedation is used.   Baseline transthoracic echocardiogram was performed. The patient's chest, abdomen, both groins, and both lower extremities are prepared and draped in a sterile manner. A time out procedure is performed.   PERIPHERAL ACCESS:   Using the modified Seldinger technique, femoral arterial and venous access were obtained with placement of a 6 Fr sheath in the artery and a 7 Fr sheath in the vein on the left side using u/s guidance.  A pigtail diagnostic catheter was passed through the femoral arterial sheath under fluoroscopic guidance into the aortic root.  A temporary transvenous pacemaker catheter was passed through the femoral venous sheath under fluoroscopic guidance into the right ventricle.  The pacemaker was tested to ensure stable lead placement and pacemaker capture. Aortic root angiography was performed in order to determine the optimal angiographic angle for valve deployment.  TRANSFEMORAL ACCESS:  A micropuncture kit was used to gain access to the right femoral artery using u/s guidance. Position confirmed with angiography. Pre-closure with double ProGlide closure devices. The patient was heparinized systemically and ACT verified > 250 seconds.    A 16 Fr transfemoral E-sheath was introduced into the right femoral artery after progressively dilating over an Amplatz superstiff wire. An AL-2 catheter was used to direct a straight-tip exchange length wire across the native aortic valve into the left ventricle. This was exchanged out for a pigtail catheter and position was confirmed in the LV apex. Simultaneous LV and Ao pressures were recorded.  The pigtail  catheter was then exchanged for a Safari wire in the LV apex.   TRANSCATHETER HEART VALVE DEPLOYMENT:  An Edwards Sapien 3 THV (size 29 mm) was prepared and crimped per manufacturer's guidelines, and the proper orientation of the valve is confirmed on the [employer] delivery system. The valve was advanced through the introducer sheath using normal technique until in an appropriate position in the abdominal aorta beyond the sheath tip. The balloon was then retracted and using the fine-tuning wheel was centered on the valve. The valve was then advanced across the aortic arch using appropriate flexion of the catheter. The valve was carefully positioned across the aortic valve annulus. The Commander catheter was retracted using normal technique. Once final position of the valve has been confirmed by angiographic assessment, the valve is deployed while temporarily holding ventilation and during rapid ventricular pacing to maintain systolic blood pressure < 50 mmHg and pulse pressure < 10 mmHg. The balloon inflation is held for >3 seconds after reaching full deployment volume. Once the balloon has fully deflated the balloon is retracted into the ascending aorta and valve function is assessed using TTE. There is felt to be no paravalvular leak and no central aortic insufficiency.  The patient's hemodynamic recovery following valve deployment is good.  The deployment balloon and guidewire are both removed. Echo demostrated acceptable post-procedural gradients, stable mitral valve function, and no AI.   PROCEDURE COMPLETION:  The sheath was then removed and closure devices were completed. Protamine was administered once femoral arterial repair was complete. The temporary pacemaker, pigtail catheters and femoral sheaths were removed with a Mynx closure device placed in the artery and manual pressure used for venous hemostasis.    The patient tolerated the procedure well and is transported  to the surgical  intensive care in stable condition. There were no immediate intraoperative complications. All sponge instrument and needle counts are verified correct at completion of the operation.   No blood products were administered during the operation.  The patient received a total of 50 mL of intravenous contrast during the procedure.  LVEDP: 18 mmHg  Lonni Cash MD, FACC 09/06/2024 2:25 PM

## 2024-09-06 NOTE — Transfer of Care (Signed)
 Immediate Anesthesia Transfer of Care Note  Patient: Chase [name]  Procedure(s) Performed: Transcatheter Aortic Valve Replacement, Transfemoral ECHOCARDIOGRAM, TRANSTHORACIC  Patient Location: Cath Lab  Anesthesia Type:MAC  Level of Consciousness: awake, alert , and oriented  Airway & Oxygen Therapy: Patient connected to face mask oxygen  Post-op Assessment: Report given to RN and Post -op Vital signs reviewed and stable  Post vital signs: Reviewed and stable  Last Vitals:  Vitals Value Taken Time  BP 107/65 09/06/24   14:30  Temp    Pulse 59 09/06/24 14:30  Resp 10 09/06/24 14:30  SpO2 95 % 09/06/24 14:30  Vitals shown include unfiled device data.  Last Pain:  Vitals:   09/06/24 1308  TempSrc:   PainSc: 0-No pain         Complications: There were no known notable events for this encounter.

## 2024-09-06 NOTE — Discharge Summary (Incomplete)
 HEART AND VASCULAR CENTER   MULTIDISCIPLINARY HEART VALVE TEAM  Discharge Summary    Patient ID: Chase [name] MRN: [id number]; DOB: [birth year]  Admit date: 09/06/2024 Discharge date: 09/07/2024  PCP:  Chase Nancyann BRAVO, MD  [facility] Cardiologist:  Chase Cage, MD  [facility] Structural heart: Chase Cash, MD [facility] Electrophysiologist:  None   Discharge Diagnoses    Principal Problem:   S/P TAVR (transcatheter aortic valve replacement) Active Problems:   Tobacco abuse   Essential hypertension   Alcohol abuse   Obesity   Severe aortic stenosis   Allergies No Known Allergies  Diagnostic Studies/Procedures    HEART AND VASCULAR CENTER  TAVR OPERATIVE NOTE     Date of Procedure:                09/06/2024   Preoperative Diagnosis:      Severe Aortic Stenosis    Postoperative Diagnosis:    Same    Procedure:        Transcatheter Aortic Valve Replacement - Transfemoral Approach             Edwards Sapien 3 Ultra Resilia THV (size 29 mm, model # [id number], serial # [id number] )              Co-Surgeons:                        Chase Cash, MD and Chase Clunes, MD    Anesthesiologist:                  Chase [name]   Echocardiographer:              Chase [name]   Pre-operative Echo Findings: Severe aortic stenosis Normal left ventricular systolic function   Post-operative Echo Findings: No paravalvular leak Normal left ventricular systolic function   _____________    Echo 09/07/24: completed but pending formal read at the time of discharge.   History of Present Illness     Chase [name] is a 68 y.o. male with a history of coronary artery calcification, AAA, ascending thoracic aortic aneurysm, HTN, HLD, ongoing alcohol and tobacco abuse, obesity and severe aortic stenosis who presented to [facility] on 09/06/24 for planned TAVR.   Mr Chase [name] developed progressive DOE and fatigue. Echo 05/30/24 showed EF 55% and severe AS with mean grad 34 mmHg, Vmax  3.95 m/s, AVA 0.88 cm2 and mild AI. Cardiac cath in 07/01/24 with no evidence of CAD and mildly elevated right heart pressures. Cardiac CT with AV calcium  score of 5697 and bicuspid aortic valve with mild dilation of the aortic root. He was seen in the CT surgery clinic on 07/14/24 by Dr. Clunes and while he is felt to be a candidate for surgery, the best plan for AVR is felt to be TAVR after discussion with the patient with plans to follow his dilated aortic root. He underwent a dental cleaning and extraction of one tooth.   The patient was evaluated by the multidisciplinary valve team and felt to have severe, symptomatic aortic stenosis and to be a suitable candidate for TAVR, which was set up for 09/06/24.  Hospital Course     Consultants: none   Severe AS:  -- S/p successful TAVR with a 29 mm Edwards Sapien 3 Ultra Resilia THV via the TF approach on 09/06/24.  -- Post operative echo completed but pending formal read. -- Groin sites are stable.  -- ECG with sinus and no high grade heart  block. -- Continue Asprin 81mg  daily.   -- Met with cardiac rehab to discuss CRP phase II.  -- Plan for discharge home today with close follow up in the outpatient setting.   HTN: -- BP well controlled.  -- Resume home amlodipine  10mg  daily, carvedilol  3.125mg  BID, lisinopril  40mg  daily.  TAA:  -- 4.1cm on recent CT.  -- Continue surveillance.   AAA:  -- 3.2 cm.  -- Continue surveillance.    Liver lesions: -- Pre TAVR CTs noted hypervascular lesions are present within the liver. These may represent flash filling hemangiomas, however, consideration should be given toward definitive characterization by MRI, liver lesion protocol, with and without contrast. -- This will be discussed in the outpatient setting.  _____________  Discharge Vitals Blood pressure 136/73, pulse 78, temperature 98 F (36.7 C), temperature source Oral, resp. rate 17, height 6' 1 (1.854 m), weight 102.1 kg, SpO2 92%.  Filed  Weights   09/06/24 0900 09/07/24 0500  Weight: 104.3 kg 102.1 kg     GEN: Well nourished, well developed in no acute distress NECK: No JVD CARDIAC: soft flow murmurs. No rubs, gallops RESPIRATORY:  Clear to auscultation without rales, wheezing or rhonchi  ABDOMEN: Soft, non-tender, non-distended EXTREMITIES:  No edema; No deformity.  Groin sites clear without hematoma or ecchymosis.    Disposition   Pt is being discharged home today in good condition.  Follow-up Plans & Appointments     Follow-up Information     Chase Lamarr SAUNDERS, PA-C. Go on 09/12/2024.   Specialties: Cardiology, Radiology Why: @ 10:20am, please arrive at least 20 minutes early. Contact information: [address] [place] [address] [phone]                Discharge Instructions     Amb Referral to Cardiac Rehabilitation   Complete by: As directed    Diagnosis: Valve Replacement   Valve: Aortic Comment - tavr   After initial evaluation and assessments completed: Virtual Based Care may be provided alone or in conjunction with Phase 2 Cardiac Rehab based on patient barriers.: Yes   Intensive Cardiac Rehabilitation (ICR) [facility] location only OR Traditional Cardiac Rehabilitation (TCR) *If criteria for ICR are not met will enroll in TCR [facility] only): Yes       Discharge Medications   Allergies as of 09/07/2024   No Known Allergies      Medication List     TAKE these medications    amLODipine  10 MG tablet Commonly known as: NORVASC  Take 1 tablet (10 mg total) by mouth daily. What changed: when to take this   aspirin  EC 81 MG tablet Take 81 mg by mouth every evening.   atorvastatin  40 MG tablet Commonly known as: LIPITOR TAKE 1 TABLET BY MOUTH EVERY DAY What changed: when to take this   carvedilol  3.125 MG tablet Commonly known as: COREG  TAKE 1 TABLET(3.125 MG) BY MOUTH TWICE DAILY   chlorhexidine 0.12 % solution Commonly known as: PERIDEX Use as directed 5 mLs in  the mouth or throat 2 (two) times daily.   CINNAMON PO Take 1 capsule by mouth at bedtime.   Fish Oil 1000 MG Caps Take 1 capsule by mouth at bedtime.   gabapentin 100 MG capsule Commonly known as: NEURONTIN Take 100 mg by mouth 2 (two) times daily.   gabapentin 300 MG capsule Commonly known as: NEURONTIN Take 300 mg by mouth at bedtime.   lisinopril  40 MG tablet Commonly known as: ZESTRIL  Take 1 tablet (40  mg total) by mouth daily. What changed: when to take this   multivitamin capsule Take 1 capsule by mouth daily. AM   omeprazole 20 MG tablet Commonly known as: PRILOSEC OTC Take 20 mg by mouth every evening.         Outstanding Labs/Studies    none  ______________________  Duration of Discharge Encounter: APP Time: 15 minutes    Signed, Lamarr Hummer, PA-C 09/07/2024, 11:18 AM [phone]  [name] was seen by me today along with MARLA Hummer, PA-C I have personally performed an evaluation on this patient.  My findings are as follows:  68 y.o. male yo male with history of AAA, TAA, HTN, HLD, tobacco abuse, etoh abuse and severe aortic stenosis who was admitted post TAVR.  TAVR with placement of a 29 mm Edwards Sapien 3 THV from the TF approach.  Doing well today.  No complaints  Data: EKG(s) and pertinent labs, studies, etc were personally reviewed and interpreted by me:  EKG reviewed by me: sinus Telemetry reviewed by me: sinus All labs reviewed by me Otherwise, I agree with data as outlined by the advanced practice provider.  Exam performed by me: Gen: NAD Neck: No JVD Cardiac: RRR without murmur Lungs: clear bilaterally Extremities: No LE edema  My Assessment and Plan:  Severe AS: s/p TAVR. Doing well today. Will discharge home today. See discharge medications above.   The patient will be discharged home today  I spent 20 minutes seeing the patient. During that time, I reviewed the labs, EKG, telemetry, echo images, evaluated their  symptoms and performed an examination. This time also included plan formulation, discussion of the plan with the patient and the time spent with documentation.   Signed,  Chase Cash, MD  09/07/2024 5:13 PM

## 2024-09-06 NOTE — Interval H&P Note (Signed)
 History and Physical Interval Note:  09/06/2024 12:18 PM  Chase [name]  has presented today for surgery, with the diagnosis of Severe Aortic Stenosis.  The various methods of treatment have been discussed with the patient and family. After consideration of risks, benefits and other options for treatment, the patient has consented to  Procedure(s): Transcatheter Aortic Valve Replacement, Transfemoral (N/A) ECHOCARDIOGRAM, TRANSTHORACIC (N/A) as a surgical intervention.  The patient's history has been reviewed, patient examined, no change in status, stable for surgery.  I have reviewed the patient's chart and labs.  Questions were answered to the patient's satisfaction.     Lonni Cash

## 2024-09-06 NOTE — Discharge Instructions (Signed)

## 2024-09-06 NOTE — Progress Notes (Signed)
 MEWS Progress Note  Patient Details Name: Chase [name] MRN: [id number] DOB: Jan 10, 1956 Today's Date: 09/06/2024   MEWS Flowsheet Documentation:  Assess: MEWS Score Temp: 97.6 F (36.4 C) BP: 97/68 MAP (mmHg): 77 Pulse Rate: (!) 55 ECG Heart Rate: (!) 52 Resp: 13 Level of Consciousness: Alert SpO2: 94 % O2 Device: Room Air Assess: MEWS Score MEWS Temp: 0 MEWS Systolic: 1 MEWS Pulse: 0 MEWS RR: 1 MEWS LOC: 0 MEWS Score: 2 MEWS Score Color: Yellow Assess: SIRS CRITERIA SIRS Temperature : 0 SIRS Respirations : 0 SIRS Pulse: 0 SIRS WBC: 0 SIRS Score Sum : 0 SIRS Temperature : 0 SIRS Pulse: 0 SIRS Respirations : 0 SIRS WBC: 0 SIRS Score Sum : 0 Assess: if the MEWS score is Yellow or Red Were vital signs accurate and taken at a resting state?: Yes Does the patient meet 2 or more of the SIRS criteria?: No MEWS guidelines implemented : Yes, yellow Treat MEWS Interventions: Considered administering scheduled or prn medications/treatments as ordered Take Vital Signs Increase Vital Sign Frequency : Yellow: Q2hr x1, continue Q4hrs until patient remains green for 12hrs Escalate MEWS: Escalate: Yellow: Discuss with charge nurse and consider notifying provider and/or RRT Notify: Charge Nurse/RN Name of Charge Nurse/RN Notified: Automotive Engineer Provider Notification Provider Name/Title: Verlin Date Provider Notified: 09/06/24 Time Provider Notified: 1600      Laneta JAYSON Rao 09/06/2024, 4:05 PM

## 2024-09-06 NOTE — Anesthesia Postprocedure Evaluation (Signed)
 Anesthesia Post Note  Patient: Chase [name]  Procedure(s) Performed: Transcatheter Aortic Valve Replacement, Transfemoral ECHOCARDIOGRAM, TRANSTHORACIC     Patient location during evaluation: PACU Anesthesia Type: MAC Level of consciousness: awake and alert Pain management: pain level controlled Vital Signs Assessment: post-procedure vital signs reviewed and stable Respiratory status: spontaneous breathing, nonlabored ventilation, respiratory function stable and patient connected to nasal cannula oxygen Cardiovascular status: stable and blood pressure returned to baseline Postop Assessment: no apparent nausea or vomiting Anesthetic complications: no   There were no known notable events for this encounter.  Last Vitals:  Vitals:   09/06/24 1540 09/06/24 1600  BP: 97/68   Pulse:    Resp: 13   Temp:  36.4 C  SpO2: 94%     Last Pain:  Vitals:   09/06/24 1600  TempSrc: Oral  PainSc:                  Heli Dino D Jordyan Hardiman

## 2024-09-06 NOTE — Plan of Care (Signed)

## 2024-09-06 NOTE — Plan of Care (Signed)

## 2024-09-06 NOTE — Progress Notes (Signed)
  Echocardiogram 2D Echocardiogram has been performed.  Devora Ellouise SAUNDERS 09/06/2024, 2:04 PM

## 2024-09-06 NOTE — Op Note (Signed)
 [facility]ART [facility] VASCULAR [facility]ENTER   MULTIDISCIPLINARY HEART VALVE TEAM   TAVR OPERATIVE NOTE   Date of Procedure:  09/06/2024  Preoperative Diagnosis: Severe Aortic Stenosis   Postoperative Diagnosis: Same   Procedure:   Transcatheter Aortic Valve Replacement - Percutaneous Transfemoral Approach  Edwards Sapien 3 Ultra Resilia (size 29 mm, model # [id number] , serial # [id number])   Co-Surgeons:  Con Clunes, MD and Lonni Cash, MD   Anesthesiologist:  Tilford, MD  Echocardiographer:  Delford, MD  Pre-operative Echo Findings: Severe aortic stenosis normal left ventricular systolic function  Post-operative Echo Findings: No paravalvular leak Normal left ventricular systolic function   BRIEF CLINICAL NOTE AND INDICATIONS FOR SURGERY  Chase [name] is a very pleasant 68 year old gentleman with history of HTN, HL, AAA (3.2 cm) active tobacco and alcohol use who presents with severe aortic stenosis 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  He has progressive shortness of breath and some intermittent chest pain.  In general, I think he is a reasonable candidate for surgery.  He is fairly functional although is a daily cigarette and alcohol user.  The main thing that needs to be addressed is his aortic stenosis.  Although his aortic root is dilated I think his risk of having a dissection or complication from aortic root dilation is low.  He is an overall pretty big guy so the root is not terribly dilated relative to the rest of his aorta. Although I did offer him SAVR, he very much prefers TAVR.  DETAILS OF THE OPERATIVE PROCEDURE  PREPARATION:    The patient was brought to the operating room on the above mentioned date and appropriate monitoring was established by the anesthesia team. The patient was placed in the supine position on the operating table.  Intravenous antibiotics were administered. The patient was monitored closely throughout the procedure under conscious  sedation.  Baseline transthoracic echocardiogram was performed. The patient's abdomen and both groins were prepped and draped in a sterile manner. A time out procedure was performed.   PERIPHERAL ACCESS:    Using the modified Seldinger technique, femoral arterial and venous access was obtained with placement of 6 Fr sheaths on the left side.  A pigtail diagnostic catheter was passed through the left arterial sheath under fluoroscopic guidance into the aortic root.  A temporary transvenous pacemaker catheter was passed through the left femoral venous sheath under fluoroscopic guidance into the right ventricle.  The pacemaker was tested to ensure stable lead placement and pacemaker capture. Aortic root angiography was performed in order to determine the optimal angiographic angle for valve deployment.   TRANSFEMORAL ACCESS:   Percutaneous transfemoral access and sheath placement was performed using ultrasound guidance.  The right common femoral artery was cannulated using a micropuncture needle and appropriate location was verified using hand injection angiogram.  A pair of Abbott Perclose percutaneous closure devices were placed and a 6 French sheath replaced into the femoral artery.  The patient was heparinized systemically and ACT verified > 250 seconds.    A 14 Fr transfemoral E-sheath was introduced into the right common femoral artery after progressively dilating over an Amplatz superstiff wire. An AL-2 catheter was used to direct a straight-tip exchange length wire across the native aortic valve into the left ventricle. This was exchanged out for a pigtail catheter and position was confirmed in the LV apex. Simultaneous LV and Ao pressures were recorded.  The LVEDP was 18 mmHg.  The pigtail catheter was exchanged for a  Safari wire in the LV apex.   TRANSCATHETER HEART VALVE DEPLOYMENT:   An Edwards Sapien 3 Ultra transcatheter heart valve (29 mm + 2 ml) was prepared and crimped per  manufacturer's guidelines, and the proper orientation of the valve was confirmed on the [employer] delivery system. The valve was advanced through the introducer sheath using normal technique until in an appropriate position in the abdominal aorta beyond the sheath tip. The balloon was then retracted and using the fine-tuning wheel was centered on the valve. The valve was then advanced across the aortic arch using appropriate flexion of the catheter. The valve was carefully positioned across the aortic valve annulus. The Commander catheter was retracted using normal technique. Once final position of the valve was confirmed by angiographic assessment, the valve was deployed during rapid ventricular pacing to maintain systolic blood pressure < 50 mmHg and pulse pressure < 10 mmHg. The balloon inflation was held for >3 seconds after reaching full deployment volume. Once the balloon was fully deflated the balloon was retracted into the ascending aorta and valve function was assessed using echocardiography. There was felt to be no paravalvular leak and no central aortic insufficiency.  The patient's hemodynamic recovery following valve deployment was good.  The deployment balloon and guidewire were both removed.    PROCEDURE COMPLETION:   The sheath was removed and femoral artery closure performed.  Protamine was administered once femoral arterial repair was complete. The temporary pacemaker, pigtail catheter and femoral sheaths were removed with manual pressure used for venous hemostasis.  A Mynx femoral closure device was utilized following removal of the diagnostic sheath in the left femoral artery.  The patient tolerated the procedure well and was transported to the cath lab recovery area in stable condition. There were no immediate intraoperative complications. All sponge instrument and needle counts were verified correct at completion of the operation.   No blood products were administered during  the operation.  The patient received a total of 50 mL of intravenous contrast during the procedure.   Con GORMAN Clunes, MD 09/06/2024 2:35 PM

## 2024-09-06 NOTE — H&P (Signed)
 Brief H&P  Chase [name] is a 68 y.o. male with history of AAA, HTN, tobacco and EtOH use who presents for surgical evaluation of severe aortic stenosis and an aortic root aneurysm.  He reports that he has some shortness of breath that comes and goes as well as some twinges of chest pain that is usually reproducible.  He isn't sure if these symptoms are worsening or if he's just overthinking things.  He denies palpitations, syncope or dizziness. He also reports a chronic cough (likely related to tobacco use) but denies fever or chills.  He does have leg swelling related to venous insufficiency.   He is an active smoker of 1 pack per day for the last 45-50 years.  He drinks 6 beers a day but sometime it's less.  He denies history of liver issues, kidney disease, stroke, diabetes or MI.  He is semi-retired and works part time as a surveyor, quantity at [employer].  He will walk the grounds once a week but mostly sits at a desk.  He is very functional and completely independent in his ADLs.   He has no family history of dissection or aortic aneurysm.  There is no history of unexplained sudden death or connective tissue disease.   Transthoracic ECHO demonstrated a calcified aortic valve with mild AI and severe AS (AVA 0.88, MG 34.0, Vmax 3.95).  LHC demonstrated  normal coronaries and the valve was not crossed.   CTA aorta showed an ascending aorta of 4.1 cm and a root of 4.3-4.4 cm.   On his non-gated CT of the chest, his annulus measures to be Area 744, avg diameter 30.8 and perimeter 97.8.  Vitals:   09/06/24 0900  BP: (!) 143/72  Pulse: 66  Resp: 18  Temp: 98.3 F (36.8 C)  SpO2: 99%   General - resting comfortably in bed CV - RRR Resp - Unlabored on RA Abd - Soft, ND/NT  Plan: Proceed today with transfemoral TAVR  Con Clunes, MD Cardiothoracic Surgery Pager: [phone]

## 2024-09-07 ENCOUNTER — Inpatient Hospital Stay (HOSPITAL_COMMUNITY)

## 2024-09-07 ENCOUNTER — Encounter (HOSPITAL_COMMUNITY): Payer: Self-pay | Admitting: Cardiovascular Disease

## 2024-09-07 DIAGNOSIS — E669 Obesity, unspecified: Secondary | ICD-10-CM | POA: Diagnosis not present

## 2024-09-07 DIAGNOSIS — I10 Essential (primary) hypertension: Secondary | ICD-10-CM | POA: Diagnosis not present

## 2024-09-07 DIAGNOSIS — I350 Nonrheumatic aortic (valve) stenosis: Principal | ICD-10-CM

## 2024-09-07 DIAGNOSIS — F1010 Alcohol abuse, uncomplicated: Secondary | ICD-10-CM | POA: Diagnosis not present

## 2024-09-07 DIAGNOSIS — Z952 Presence of prosthetic heart valve: Secondary | ICD-10-CM | POA: Diagnosis not present

## 2024-09-07 LAB — BASIC METABOLIC PANEL WITH GFR
Anion gap: 13 (ref 5–15)
BUN: 16 mg/dL (ref 8–23)
CO2: 23 mmol/L (ref 22–32)
Calcium: 8.3 mg/dL — ABNORMAL LOW (ref 8.9–10.3)
Chloride: 104 mmol/L (ref 98–111)
Creatinine, Ser: 0.96 mg/dL (ref 0.61–1.24)
GFR, Estimated: >60 mL/min (ref >60)
Glucose, Bld: 105 mg/dL — ABNORMAL HIGH (ref 70–99)
Potassium: 4 mmol/L (ref 3.5–5.1)
Sodium: 140 mmol/L (ref 135–145)

## 2024-09-07 LAB — POCT I-STAT, CHEM 8
BUN: 16 mg/dL (ref 8–23)
BUN: 14 mg/dL (ref 8–23)
Calcium, Ion: 1.17 mmol/L (ref 1.15–1.40)
Calcium, Ion: 1.12 mmol/L — ABNORMAL LOW (ref 1.15–1.40)
Chloride: 106 mmol/L (ref 98–111)
Chloride: 109 mmol/L (ref 98–111)
Creatinine, Ser: 0.9 mg/dL (ref 0.61–1.24)
Creatinine, Ser: 0.7 mg/dL (ref 0.61–1.24)
Glucose, Bld: 123 mg/dL — ABNORMAL HIGH (ref 70–99)
Glucose, Bld: 98 mg/dL (ref 70–99)
HCT: 35 % — ABNORMAL LOW (ref 39.0–52.0)
HCT: 33 % — ABNORMAL LOW (ref 39.0–52.0)
Hemoglobin: 11.9 g/dL — ABNORMAL LOW (ref 13.0–17.0)
Hemoglobin: 11.2 g/dL — ABNORMAL LOW (ref 13.0–17.0)
Potassium: 3.9 mmol/L (ref 3.5–5.1)
Potassium: 3.6 mmol/L (ref 3.5–5.1)
Sodium: 142 mmol/L (ref 135–145)
Sodium: 143 mmol/L (ref 135–145)
TCO2: 23 mmol/L (ref 22–32)
TCO2: 22 mmol/L (ref 22–32)

## 2024-09-07 LAB — CBC
HCT: 39.4 % (ref 39.0–52.0)
Hemoglobin: 13.5 g/dL (ref 13.0–17.0)
MCH: 34 pg (ref 26.0–34.0)
MCHC: 34.3 g/dL (ref 30.0–36.0)
MCV: 99.2 fL (ref 80.0–100.0)
Platelets: 141 K/uL — ABNORMAL LOW (ref 150–400)
RBC: 3.97 MIL/uL — ABNORMAL LOW (ref 4.22–5.81)
RDW: 13 % (ref 11.5–15.5)
WBC: 6.6 K/uL (ref 4.0–10.5)
nRBC: 0 % (ref 0.0–0.2)

## 2024-09-07 LAB — ECHOCARDIOGRAM COMPLETE
AR max vel: 2.92 cm2
AV Area VTI: 3.23 cm2
AV Area mean vel: 3.45 cm2
AV Mean grad: 8 mmHg
AV Peak grad: 15.2 mmHg
Ao pk vel: 1.95 m/s
Area-P 1/2: 3.03 cm2
Height: 73 in
S' Lateral: 3 cm
Weight: 3600 [oz_av]

## 2024-09-07 LAB — POCT ACTIVATED CLOTTING TIME
Activated Clotting Time: 129 s
Activated Clotting Time: 320 s

## 2024-09-07 LAB — MAGNESIUM: Magnesium: 1.7 mg/dL (ref 1.7–2.4)

## 2024-09-07 MED FILL — Midazolam HCl Inj 2 MG/2ML (Base Equivalent): INTRAMUSCULAR | Qty: 1 | Status: AC

## 2024-09-07 NOTE — Progress Notes (Signed)
 Echocardiogram 2D Echocardiogram has been performed.  Damien FALCON Curlie Sittner RDCS 09/07/2024, 11:44 AM

## 2024-09-07 NOTE — Progress Notes (Signed)
 Discussed with pt and wife smoking cessation, restrictions, exercise guidelines, and CRPII. Pt receptive. Will refer to [facility] CRPII.  [id number] Aliene Aris BS, ACSM-CEP 09/07/2024 10:16 AM

## 2024-09-07 NOTE — Progress Notes (Signed)
 Mobility Specialist Progress Note:   09/07/24 0903  Mobility  Activity Ambulated independently  Level of Assistance Independent  Assistive Device None  Distance Ambulated (ft) 400 ft  Activity Response Tolerated well  Mobility Referral Yes  Mobility visit 1 Mobility  Mobility Specialist Start Time (ACUTE ONLY) [id number]  Mobility Specialist Stop Time (ACUTE ONLY) 0907  Mobility Specialist Time Calculation (min) (ACUTE ONLY) 4 min   Pt received in bed, agreeable to mobility session. Ambulated in hallway independently. Tolerated well, asx throughout. HR in 80s. Eager for d/c. Left with all needs met, wife in room.   Amillia Biffle Mobility Specialist Please contact via Special Educational Needs Teacher or  Rehab office at [phone]

## 2024-09-08 ENCOUNTER — Telehealth: Payer: Self-pay

## 2024-09-08 ENCOUNTER — Encounter: Payer: Self-pay | Admitting: Family Medicine

## 2024-09-08 ENCOUNTER — Ambulatory Visit: Payer: Self-pay | Admitting: Family Medicine

## 2024-09-08 NOTE — Telephone Encounter (Signed)
 Patient contacted regarding discharge from [facility] on 09/07/24  Patient understands to follow up with provider Izetta Hummer, PA-C on 09/12/24 at 10:20AM at [address] Location. Patient understands discharge instructions? Yes Patient understands medications and regiment? Yes Patient understands to bring all medications to this visit? Yes

## 2024-09-12 ENCOUNTER — Ambulatory Visit: Attending: Physician Assistant | Admitting: Physician Assistant

## 2024-09-12 ENCOUNTER — Other Ambulatory Visit (HOSPITAL_COMMUNITY): Payer: Self-pay

## 2024-09-12 VITALS — BP 118/56 | HR 74 | Ht 73.0 in | Wt 228.2 lb

## 2024-09-12 DIAGNOSIS — I77810 Thoracic aortic ectasia: Secondary | ICD-10-CM

## 2024-09-12 DIAGNOSIS — Z720 Tobacco use: Secondary | ICD-10-CM

## 2024-09-12 DIAGNOSIS — Z952 Presence of prosthetic heart valve: Secondary | ICD-10-CM | POA: Diagnosis not present

## 2024-09-12 DIAGNOSIS — I10 Essential (primary) hypertension: Secondary | ICD-10-CM | POA: Diagnosis not present

## 2024-09-12 DIAGNOSIS — I714 Abdominal aortic aneurysm, without rupture, unspecified: Secondary | ICD-10-CM

## 2024-09-12 DIAGNOSIS — K769 Liver disease, unspecified: Secondary | ICD-10-CM | POA: Diagnosis not present

## 2024-09-12 MED ORDER — AMOXICILLIN 500 MG PO CAPS
2000.0000 mg | ORAL_CAPSULE | ORAL | 12 refills | Status: AC
  Filled 2024-09-12: qty 12, 3d supply, fill #0

## 2024-09-12 NOTE — Patient Instructions (Addendum)
 Medication Instructions:  Your physician has recommended you make the following change in your medication:  START Amoxicillin  500 mg, take 4 tablets by mouth 1 hour prior to dental procedures and cleanings.    *If you need a refill on your cardiac medications before your next appointment, please call your pharmacy*  Lab Work: None needed If you have labs (blood work) drawn today and your tests are completely normal, you will receive your results only by: MyChart Message (if you have MyChart) OR A paper copy in the mail If you have any lab test that is abnormal or we need to change your treatment, we will call you to review the results.  Testing/Procedures: 10/04/24 Your physician has requested that you have an echocardiogram. Echocardiography is a painless test that uses sound waves to create images of your heart. It provides your doctor with information about the size and shape of your heart and how well your heart's chambers and valves are working. This procedure takes approximately one hour. There are no restrictions for this procedure. Please do NOT wear cologne, perfume, aftershave, or lotions (deodorant is allowed). Please arrive 15 minutes prior to your appointment time.  Please note: We ask at that you not bring children with you during ultrasound (echo/ vascular) testing. Due to room size and safety concerns, children are not allowed in the ultrasound rooms during exams. Our front office staff cannot provide observation of children in our lobby area while testing is being conducted. An adult accompanying a patient to their appointment will only be allowed in the ultrasound room at the discretion of the ultrasound technician under special circumstances. We apologize for any inconvenience.   Follow-Up: At [facility], you and your health needs are our priority.  As part of our continuing mission to provide you with exceptional heart care, our providers are all part of one team.   This team includes your primary Cardiologist (physician) and Advanced Practice Providers or APPs (Physician Assistants and Nurse Practitioners) who all work together to provide you with the care you need, when you need it.  Your next appointment:   As scheduled on 10/14/24  Provider:   Bernardino Bring, PA-C  We recommend signing up for the patient portal called MyChart.  Sign up information is provided on this After Visit Summary.  MyChart is used to connect with patients for Virtual Visits (Telemedicine).  Patients are able to view lab/test results, encounter notes, upcoming appointments, etc.  Non-urgent messages can be sent to your provider as well.   To learn more about what you can do with MyChart, go to [email or web address].   Other Instructions After discussing your MRI at your appointment with Bernardino Bring, please call [phone] to schedule your liver MRI.

## 2024-09-12 NOTE — Progress Notes (Signed)
 [facility] VASCULAR [name]   MULTIDISCIPLINARY HEART VALVE CLINIC                                     Cardiology Office Note:    Date:  09/12/2024   ID:  Chase [name], DOB 05/26/1956, MRN [id number]  PCP:  Chase Nancyann BRAVO, MD  [facility] Cardiologist:  Chase Cage, MD  [name] Structural heart: Chase Cash, MD [facility] Electrophysiologist:  None   Referring MD: Chase Nancyann BRAVO, MD   [facility] s/p TAVR  History of Present Illness:    Chase [name] is a 68 y.o. male with a hx of  AAA, ascending thoracic aortic aneurysm, HTN, HLD, ongoing alcohol and tobacco abuse, obesity and severe aortic stenosis s/p TAVR (09/06/24) who presents to clinic for follow up.   Chase [name] developed progressive DOE and fatigue. Echo 05/30/24 showed EF 55% and severe AS with mean grad 34 mmHg, Vmax 3.95 m/s, AVA 0.88 cm2 and mild AI. Cardiac cath in 07/01/24 with no evidence of CAD and mildly elevated right heart pressures. Cardiac CT with AV calcium  score of 5697 and bicuspid aortic valve with mild dilation of the aortic root. He was seen in the CT surgery clinic on 07/14/24 by Dr. Daniel and while he is felt to be a candidate for surgery, the best plan for AVR is felt to be TAVR after discussion with the patient with plans to follow his dilated aortic root. He underwent a dental cleaning and extraction of one tooth. S/p successful TAVR with a 29 mm Edwards Sapien 3 Ultra Resilia THV via the TF approach on 09/06/24. Post operative echo showed EF 60%, normally functioning TAVR with a mean gradient of 8 mmHg and no PVL.  Today the patient presents to clinic for follow up. No CP or SOB. No LE edema, orthopnea or PND. No dizziness or syncope. No blood in stool or urine. No palpitations. Walked up 3 flights of stairs and was able to do it withough stopping to catch breath, which he could not do before. Cut way back on smoking from 1 PPD to 40 cigs in about 1 week.   Past Medical History:  Diagnosis Date    Arthritis    maybe - hands   Chest pain    a. ETT 06/14/2015: no st segment or T-waves changes during stress, mildly reduced exercise capacity, hypertensive response to exercise   Chicken pox    Chronic venous insufficiency    a. improved with support hose.   Erectile dysfunction    Essential hypertension    CONTROLLED ON MEDS   History of echocardiogram    a. 03/2016 Echo: EF 55-60%, no rwma, mild AS, triv AI, mildly dil Ao.   HLD (hyperlipidemia)    Measles    Mumps    Polysubstance abuse (HCC)    a. 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 Daniel and Dr. Cash   Wears dentures    full upper     Current Medications: Current Meds  Medication Sig   amLODipine  (NORVASC ) 10 MG tablet Take 1 tablet (10 mg total) by mouth daily.   amoxicillin  (AMOXIL ) 500 MG capsule Take 4 capsules (2,000 mg total) by mouth as directed 1 hour prior to dental  work including cleanings   aspirin  EC 81 MG tablet Take 81 mg by mouth every evening.   atorvastatin  (LIPITOR) 40 MG tablet TAKE 1 TABLET BY MOUTH EVERY DAY   carvedilol  (COREG ) 3.125 MG tablet TAKE 1 TABLET(3.125 MG) BY MOUTH TWICE DAILY   chlorhexidine  (PERIDEX ) 0.12 % solution Use as directed 5 mLs in the mouth or throat 2 (two) times daily.   CINNAMON PO Take 1 capsule by mouth at bedtime.   gabapentin  (NEURONTIN ) 100 MG capsule Take 100 mg by mouth 2 (two) times daily.   gabapentin  (NEURONTIN ) 300 MG capsule Take 300 mg by mouth at bedtime.   lisinopril  (ZESTRIL ) 40 MG tablet Take 1 tablet (40 mg total) by mouth daily.   Multiple Vitamin (MULTIVITAMIN) capsule Take 1 capsule by mouth daily. AM   Omega-3 Fatty Acids (FISH OIL) 1000 MG CAPS Take 1 capsule by mouth at bedtime.   omeprazole  (PRILOSEC  OTC) 20 MG tablet Take 20 mg by mouth every evening.      ROS:   Please see the history of present  illness.    All other systems reviewed and are negative.  EKGs   EKG Interpretation Date/Time:  Monday September 12 2024 09:48:40 EST Ventricular Rate:  73 PR Interval:  188 QRS Duration:  84 QT Interval:  380 QTC Calculation: 418 R Axis:   74  Text Interpretation: Normal sinus rhythm Normal ECG Confirmed by Chase [name] [phone]) on 09/12/2024 10:01:41 AM   Risk Assessment/Calculations:            Physical Exam:    VS:  BP (!) 118/56   Pulse 74   Ht 6' 1 (1.854 m)   Wt 228 lb 3.2 oz (103.5 kg)   SpO2 95%   BMI 30.11 kg/m     Wt Readings from Last 3 Encounters:  09/12/24 228 lb 3.2 oz (103.5 kg)  09/07/24 225 lb (102.1 kg)  08/25/24 229 lb 9.6 oz (104.1 kg)     GEN: Well nourished, well developed in no acute distress NECK: No JVD CARDIAC: RRR, no murmurs, rubs, gallops RESPIRATORY:  Clear to auscultation without rales, wheezing or rhonchi  ABDOMEN: Soft, non-tender, non-distended EXTREMITIES:  No edema; No deformity.  Groin sites clear without hematoma. Mild ecchymosis bilaterally.   ASSESSMENT:    1. S/P TAVR (transcatheter aortic valve replacement)   2. Essential hypertension   3. Ascending aorta dilatation   4. Abdominal aortic aneurysm (AAA) without rupture, unspecified part   5. Tobacco use   6. Lesion of liver     PLAN:    In order of problems listed above:  Severe AS s/p TAVR:  -- Pt doing great s/p TAVR.  -- ECG with no HAVB.  -- Groin sites healing well.  -- SBE prophylaxis discussed; I have RX'd amoxicillin .  -- Continue Aspirin  81mg  daily. -- Cleared to resume all activities without restriction. -- He will do his 1 month follow up in [place] and follow up with Chase Bring PA-C.  HTN: -- BP well controlled.  -- Continue home amlodipine  10mg  daily, carvedilol  3.125mg  BID, and lisinopril  40mg  daily.   TAA:  -- 4.1cm on recent CT.  -- Continue surveillance.    AAA:  -- 3.2 cm.  -- Continue surveillance.   Tobacco abuse: --  Cut way back. Has kept track and only smoked 44 cigarettes over 6 days -- Encouraged complete cessation.    Liver lesions: -- Pre TAVR CTs noted hypervascular lesions are present within the liver. These  may represent flash filling hemangiomas, however, consideration should be given toward definitive characterization by MRI, liver lesion protocol, with and without contrast. -- MRI ordered, but he wants to discuss with Chase Bring PA-C before scheduling. He has the radiology scheduling number [phone]) to call after that discussion.      Cardiac Rehabilitation Eligibility Assessment  The patient is ready to start cardiac rehabilitation from a cardiac standpoint.      Medication Adjustments/Labs and Tests Ordered: Current medicines are reviewed at length with the patient today.  Concerns regarding medicines are outlined above.  Orders Placed This Encounter  Procedures   Chase LIVER W WO CONTRAST   EKG 12-Lead   Meds ordered this encounter  Medications   amoxicillin  (AMOXIL ) 500 MG capsule    Sig: Take 4 capsules (2,000 mg total) by mouth as directed 1 hour prior to dental work including cleanings    Dispense:  12 capsule    Refill:  12    Supervising Provider:   WONDA SHARPER [[id number]]    Patient Instructions  Medication Instructions:  Your physician has recommended you make the following change in your medication:  START Amoxicillin  500 mg, take 4 tablets by mouth 1 hour prior to dental procedures and cleanings.    *If you need a refill on your cardiac medications before your next appointment, please call your pharmacy*  Lab Work: None needed If you have labs (blood work) drawn today and your tests are completely normal, you will receive your results only by: MyChart Message (if you have MyChart) OR A paper copy in the mail If you have any lab test that is abnormal or we need to change your treatment, we will call you to review the results.  Testing/Procedures:  10/04/24 Your physician has requested that you have an echocardiogram. Echocardiography is a painless test that uses sound waves to create images of your heart. It provides your doctor with information about the size and shape of your heart and how well your heart's chambers and valves are working. This procedure takes approximately one hour. There are no restrictions for this procedure. Please do NOT wear cologne, perfume, aftershave, or lotions (deodorant is allowed). Please arrive 15 minutes prior to your appointment time.  Please note: We ask at that you not [name] children with you during ultrasound (echo/ vascular) testing. Due to room size and safety concerns, children are not allowed in the ultrasound rooms during exams. Our front office staff cannot provide observation of children in our lobby area while testing is being conducted. An adult accompanying a patient to their appointment will only be allowed in the ultrasound room at the discretion of the ultrasound technician under special circumstances. We apologize for any inconvenience.   Follow-Up: At [name], you and your health needs are our priority.  As part of our continuing mission to provide you with exceptional heart care, our providers are all part of one team.  This team includes your primary Cardiologist (physician) and Advanced Practice Providers or APPs (Physician Assistants and Nurse Practitioners) who all work together to provide you with the care you need, when you need it.  Your next appointment:   As scheduled on 10/14/24  Provider:   Bernardino Bring, PA-C  We recommend signing up for the patient portal called MyChart.  Sign up information is provided on this After Visit Summary.  MyChart is used to connect with patients for Virtual Visits (Telemedicine).  Patients are able to view lab/test results, encounter notes,  upcoming appointments, etc.  Non-urgent messages can be sent to your provider as well.   To learn  more about what you can do with MyChart, go to [email or web address].   Other Instructions After discussing your MRI at your appointment with Chase [name], please call [phone] to schedule your liver MRI.     Signed, Lamarr Hummer, PA-C  09/12/2024 1:08 PM    [facility]

## 2024-09-22 ENCOUNTER — Other Ambulatory Visit: Payer: Self-pay

## 2024-09-22 ENCOUNTER — Encounter

## 2024-09-22 VITALS — Ht 73.1 in | Wt 230.1 lb

## 2024-09-22 DIAGNOSIS — Z952 Presence of prosthetic heart valve: Secondary | ICD-10-CM | POA: Insufficient documentation

## 2024-09-22 NOTE — Progress Notes (Signed)
 Cardiac Individual Treatment Plan  Patient Details  Name: Chase [name] MRN: [id number] Date of Birth: 1955/10/21 Referring Provider:   Flowsheet Row Cardiac Rehab from 09/22/2024 in [facility] Cardiac and Pulmonary Rehab  Referring Provider Dr. [name]    Initial Encounter Date:  Flowsheet Row Cardiac Rehab from 09/22/2024 in [facility] Cardiac and Pulmonary Rehab  Date 09/22/24    Visit Diagnosis: S/P TAVR (transcatheter aortic valve replacement)  Patient's Home Medications on Admission:  Current Outpatient Medications:    amLODipine  (NORVASC ) 10 MG tablet, Take 1 tablet (10 mg total) by mouth daily., Disp: 90 tablet, Rfl: 3   amoxicillin  (AMOXIL ) 500 MG capsule, Take 4 capsules (2,000 mg total) by mouth as directed 1 hour prior to dental work including cleanings, Disp: 12 capsule, Rfl: 12   aspirin  EC 81 MG tablet, Take 81 mg by mouth every evening., Disp: , Rfl:    atorvastatin  (LIPITOR) 40 MG tablet, TAKE 1 TABLET BY MOUTH EVERY DAY, Disp: 90 tablet, Rfl: 3   carvedilol  (COREG ) 3.125 MG tablet, TAKE 1 TABLET(3.125 MG) BY MOUTH TWICE DAILY, Disp: 180 tablet, Rfl: 3   chlorhexidine  (PERIDEX ) 0.12 % solution, Use as directed 5 mLs in the mouth or throat 2 (two) times daily., Disp: , Rfl:    CINNAMON PO, Take 1 capsule by mouth at bedtime., Disp: , Rfl:    gabapentin  (NEURONTIN ) 100 MG capsule, Take 100 mg by mouth 2 (two) times daily., Disp: , Rfl:    gabapentin  (NEURONTIN ) 300 MG capsule, Take 300 mg by mouth at bedtime., Disp: , Rfl:    lisinopril  (ZESTRIL ) 40 MG tablet, Take 1 tablet (40 mg total) by mouth daily., Disp: 90 tablet, Rfl: 3   Multiple Vitamin (MULTIVITAMIN) capsule, Take 1 capsule by mouth daily. AM, Disp: , Rfl:    Omega-3 Fatty Acids (FISH OIL) 1000 MG CAPS, Take 1 capsule by mouth at bedtime., Disp: , Rfl:    omeprazole  (PRILOSEC  OTC) 20 MG tablet, Take 20 mg by mouth every evening., Disp: , Rfl:   Past Medical History: Past Medical History:  Diagnosis Date   Arthritis     maybe - hands   Chest pain    a. ETT 06/14/2015: no st segment or T-waves changes during stress, mildly reduced exercise capacity, hypertensive response to exercise   Chicken pox    Chronic venous insufficiency    a. improved with support hose.   Erectile dysfunction    Essential hypertension    CONTROLLED ON MEDS   History of echocardiogram    a. 03/2016 Echo: EF 55-60%, no rwma, mild AS, triv AI, mildly dil Ao.   HLD (hyperlipidemia)    Measles    Mumps    Polysubstance abuse (HCC)    a. 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 Daniel and Dr. Verlin   Wears dentures    full upper    Tobacco Use: Social History   Tobacco Use  Smoking Status Every Day   Current packs/day: 1.50   Average packs/day: 1.5 packs/day for 54.5 years (81.7 ttl pk-yrs)   Types: Cigarettes   Start date: 03/27/1970  Smokeless Tobacco Never  Tobacco Comments   Smokes 1.5-2 packs per day     Labs: Review Flowsheet  More data exists      Latest Ref Rng & Units 09/27/2021 06/29/2023 06/23/2024 07/01/2024 09/06/2024  Labs for ITP Cardiac and Pulmonary Rehab  Cholestrol 100 - 199 mg/dL [phone]  - -  LDL (calc) 0 - 99 mg/dL 78  60  56  - -  HDL-C >39 mg/dL 56  51  60  - -  Trlycerides 0 - 149 mg/dL 69  83  95  - -  PH, Arterial 7.35 - 7.45 - - - 7.380  -  PCO2 arterial 32 - 48 mmHg - - - 43.0  -  Bicarbonate 20.0 - 28.0 mmol/L - - - 27.4  25.4  -  TCO2 22 - 32 mmol/L - - - 29  27  22  23    O2 Saturation % - - - 68  96  -    Details       Multiple values from one day are sorted in reverse-chronological order          Exercise Target Goals: Exercise Program Goal: Individual exercise prescription set using results from initial 6 min walk test and THRR while considering  patient's activity barriers and safety.   Exercise Prescription Goal: Initial  exercise prescription builds to 30-45 minutes a day of aerobic activity, 2-3 days per week.  Home exercise guidelines will be given to patient during program as part of exercise prescription that the participant will acknowledge.   Education: Aerobic Exercise: - Group verbal and visual presentation on the components of exercise prescription. Introduces F.I.T.T principle from ACSM for exercise prescriptions.  Reviews F.I.T.T. principles of aerobic exercise including progression. Written material provided at class time.   Education: Resistance Exercise: - Group verbal and visual presentation on the components of exercise prescription. Introduces F.I.T.T principle from ACSM for exercise prescriptions  Reviews F.I.T.T. principles of resistance exercise including progression. Written material provided at class time.    Education: Exercise & Equipment Safety: - Individual verbal instruction and demonstration of equipment use and safety with use of the equipment. Flowsheet Row Cardiac Rehab from 09/22/2024 in [facility] Cardiac and Pulmonary Rehab  Date 09/22/24  Educator [facility]  Instruction Review Code 1- Verbalizes Understanding    Education: Exercise Physiology & General Exercise Guidelines: - Group verbal and written instruction with models to review the exercise physiology of the cardiovascular system and associated critical values. Provides general exercise guidelines with specific guidelines to those with heart or lung disease. Written material provided at class time.   Education: Flexibility, Balance, Mind/Body Relaxation: - Group verbal and visual presentation with interactive activity on the components of exercise prescription. Introduces F.I.T.T principle from ACSM for exercise prescriptions. Reviews F.I.T.T. principles of flexibility and balance exercise training including progression. Also discusses the mind body connection.  Reviews various relaxation techniques to help reduce and manage stress  (i.e. Deep breathing, progressive muscle relaxation, and visualization). Balance handout provided to take home. Written material provided at class time.   Activity Barriers & Risk Stratification:  Activity Barriers & Cardiac Risk Stratification - 09/22/24 1613       Activity Barriers & Cardiac Risk Stratification   Activity Barriers None    Cardiac Risk Stratification Moderate          6 Minute Walk:  6 Minute Walk     Row Name 09/22/24 1615         6 Minute Walk   Phase Initial     Distance 1435 feet     Walk Time 6 minutes     # of Rest Breaks 0     MPH 2.7  METS 3.12     RPE 9     Perceived Dyspnea  1     VO2 Peak 10.9     Symptoms Yes (comment)     Comments tightness in chest     Resting HR 78 bpm     Resting BP 124/64     Resting Oxygen Saturation  97 %     Exercise Oxygen Saturation  during 6 min walk 97 %     Max Ex. HR 88 bpm     Max Ex. BP 138/62     2 Minute Post BP 122/64        Oxygen Initial Assessment:   Oxygen Re-Evaluation:   Oxygen Discharge (Final Oxygen Re-Evaluation):   Initial Exercise Prescription:  Initial Exercise Prescription - 09/22/24 1600       Date of Initial Exercise RX and Referring Provider   Date 09/22/24    Referring Provider Dr. [name]      Oxygen   Maintain Oxygen Saturation 88% or higher      Treadmill   MPH 2.7    Grade 0    Minutes 15    METs 3.07      NuStep   Level 3    SPM 80    Minutes 15    METs 3.12      Rower   Level 3    Watts 30    Minutes 15    METs 3.12      Prescription Details   Duration Progress to 30 minutes of continuous aerobic without signs/symptoms of physical distress      Intensity   THRR 40-80% of Max Heartrate 108-138    Ratings of Perceived Exertion 11-13    Perceived Dyspnea 0-4      Progression   Progression Continue to progress workloads to maintain intensity without signs/symptoms of physical distress.      Resistance Training   Training  Prescription Yes    Weight 5lb    Reps 10-15          Perform Capillary Blood Glucose checks as needed.  Exercise Prescription Changes:   Exercise Prescription Changes     Row Name 09/22/24 1600             Response to Exercise   Blood Pressure (Admit) 124/64       Blood Pressure (Exercise) 138/62       Blood Pressure (Exit) 122/64       Heart Rate (Admit) 78 bpm       Heart Rate (Exercise) 88 bpm       Heart Rate (Exit) 80 bpm       Oxygen Saturation (Admit) 97 %       Oxygen Saturation (Exercise) 97 %       Oxygen Saturation (Exit) 97 %       Rating of Perceived Exertion (Exercise) 9       Perceived Dyspnea (Exercise) 1       Symptoms chest tightness       Comments results          Exercise Comments:   Exercise Goals and Review:   Exercise Goals     Row Name 09/22/24 1634             Exercise Goals   Increase Physical Activity Yes       Intervention Provide advice, education, support and counseling about physical activity/exercise needs.;Develop an individualized exercise prescription for aerobic and resistive training  based on initial evaluation findings, risk stratification, comorbidities and participant's personal goals.       Expected Outcomes Short Term: Attend rehab on a regular basis to increase amount of physical activity.;Long Term: Add in home exercise to make exercise part of routine and to increase amount of physical activity.;Long Term: Exercising regularly at least 3-5 days a week.       Increase Strength and Stamina Yes       Intervention Provide advice, education, support and counseling about physical activity/exercise needs.;Develop an individualized exercise prescription for aerobic and resistive training based on initial evaluation findings, risk stratification, comorbidities and participant's personal goals.       Expected Outcomes Short Term: Increase workloads from initial exercise prescription for resistance, speed, and METs.;Long  Term: Improve cardiorespiratory fitness, muscular endurance and strength as measured by increased METs and functional capacity ( );Short Term: Perform resistance training exercises routinely during rehab and add in resistance training at home       Able to understand and use rate of perceived exertion (RPE) scale Yes       Intervention Provide education and explanation on how to use RPE scale       Expected Outcomes Short Term: Able to use RPE daily in rehab to express subjective intensity level;Long Term:  Able to use RPE to guide intensity level when exercising independently       Able to understand and use Dyspnea scale Yes       Intervention Provide education and explanation on how to use Dyspnea scale       Expected Outcomes Short Term: Able to use Dyspnea scale daily in rehab to express subjective sense of shortness of breath during exertion;Long Term: Able to use Dyspnea scale to guide intensity level when exercising independently       Knowledge and understanding of Target Heart Rate Range (THRR) Yes       Intervention Provide education and explanation of THRR including how the numbers were predicted and where they are located for reference       Expected Outcomes Short Term: Able to state/look up THRR;Short Term: Able to use daily as guideline for intensity in rehab;Long Term: Able to use THRR to govern intensity when exercising independently       Able to check pulse independently Yes       Intervention Review the importance of being able to check your own pulse for safety during independent exercise;Provide education and demonstration on how to check pulse in carotid and radial arteries.       Expected Outcomes Short Term: Able to explain why pulse checking is important during independent exercise;Long Term: Able to check pulse independently and accurately       Understanding of Exercise Prescription Yes       Intervention Provide education, explanation, and written materials on patient's  individual exercise prescription       Expected Outcomes Short Term: Able to explain program exercise prescription;Long Term: Able to explain home exercise prescription to exercise independently          Exercise Goals Re-Evaluation :   Discharge Exercise Prescription (Final Exercise Prescription Changes):  Exercise Prescription Changes - 09/22/24 1600       Response to Exercise   Blood Pressure (Admit) 124/64    Blood Pressure (Exercise) 138/62    Blood Pressure (Exit) 122/64    Heart Rate (Admit) 78 bpm    Heart Rate (Exercise) 88 bpm    Heart Rate (Exit) 80 bpm  Oxygen Saturation (Admit) 97 %    Oxygen Saturation (Exercise) 97 %    Oxygen Saturation (Exit) 97 %    Rating of Perceived Exertion (Exercise) 9    Perceived Dyspnea (Exercise) 1    Symptoms chest tightness    Comments results          Nutrition:  Target Goals: Understanding of nutrition guidelines, daily intake of sodium 1500mg , cholesterol 200mg , calories 30% from fat and 7% or less from saturated fats, daily to have 5 or more servings of fruits and vegetables.  Education: Nutrition 1 -Group instruction provided by verbal, written material, interactive activities, discussions, models, and posters to present general guidelines for heart healthy nutrition including macronutrients, label reading, and promoting whole foods over processed counterparts. Education serves as pensions consultant of discussion of heart healthy eating for all. Written material provided at class time.    Education: Nutrition 2 -Group instruction provided by verbal, written material, interactive activities, discussions, models, and posters to present general guidelines for heart healthy nutrition including sodium, cholesterol, and saturated fat. Providing guidance of habit forming to improve blood pressure, cholesterol, and body weight. Written material provided at class time.     Biometrics:  Pre Biometrics - 09/22/24 1635       Pre  Biometrics   Height 6' 1.1 (1.857 m)    Weight 230 lb 1.6 oz (104.4 kg)    Waist Circumference 44 inches    Hip Circumference 42 inches    Waist to Hip Ratio 1.05 %    BMI (Calculated) 30.27    Single Leg Stand 30 seconds           Nutrition Therapy Plan and Nutrition Goals:   Nutrition Assessments:  MEDIFICTS Score Key: >=70 Need to make dietary changes  40-70 Heart Healthy Diet <= 40 Therapeutic Level Cholesterol Diet   Picture Your Plate Scores: <59 Unhealthy dietary pattern with much room for improvement. 41-50 Dietary pattern unlikely to meet recommendations for good health and room for improvement. 51-60 More healthful dietary pattern, with some room for improvement.  >60 Healthy dietary pattern, although there may be some specific behaviors that could be improved.    Nutrition Goals Re-Evaluation:   Nutrition Goals Discharge (Final Nutrition Goals Re-Evaluation):   Psychosocial: Target Goals: Acknowledge presence or absence of significant depression and/or stress, maximize coping skills, provide positive support system. Participant is able to verbalize types and ability to use techniques and skills needed for reducing stress and depression.   Education: Stress, Anxiety, and Depression - Group verbal and visual presentation to define topics covered.  Reviews how body is impacted by stress, anxiety, and depression.  Also discusses healthy ways to reduce stress and to treat/manage anxiety and depression. Written material provided at class time.   Education: Sleep Hygiene -Provides group verbal and written instruction about how sleep can affect your health.  Define sleep hygiene, discuss sleep cycles and impact of sleep habits. Review good sleep hygiene tips.   Initial Review & Psychosocial Screening:  Initial Psych Review & Screening - 09/22/24 1623       Initial Review   Current issues with None Identified      Family Dynamics   Good Support System? Yes    wife, children and church     Barriers   Psychosocial barriers to participate in program There are no identifiable barriers or psychosocial needs.;The patient should benefit from training in stress management and relaxation.      Screening Interventions  Interventions Encouraged to exercise;To provide support and resources with identified psychosocial needs;Provide feedback about the scores to participant    Expected Outcomes Short Term goal: Utilizing psychosocial counselor, staff and physician to assist with identification of specific Stressors or current issues interfering with healing process. Setting desired goal for each stressor or current issue identified.;Long Term Goal: Stressors or current issues are controlled or eliminated.;Short Term goal: Identification and review with participant of any Quality of Life or Depression concerns found by scoring the questionnaire.;Long Term goal: The participant improves quality of Life and PHQ9 Scores as seen by post scores and/or verbalization of changes          Quality of Life Scores:   Scores of 19 and below usually indicate a poorer quality of life in these areas.  A difference of  2-3 points is a clinically meaningful difference.  A difference of 2-3 points in the total score of the Quality of Life Index has been associated with significant improvement in overall quality of life, self-image, physical symptoms, and general health in studies assessing change in quality of life.  PHQ-9: Review Flowsheet        No data to display         Interpretation of Total Score  Total Score Depression Severity:  1-4 = Minimal depression, 5-9 = Mild depression, 10-14 = Moderate depression, 15-19 = Moderately severe depression, 20-27 = Severe depression   Psychosocial Evaluation and Intervention:  Psychosocial Evaluation - 09/22/24 1624       Psychosocial Evaluation & Interventions   Interventions Stress management education;Relaxation  education;Encouraged to exercise with the program and follow exercise prescription    Comments Eri is looking forward to starting cardiac rehab after his recent TAVR. He has a long history of smoking 1-2 packs of cigarettes/day, however has recently decreased his smoking to about 14/day and only outside. He states he is writing down every time he smokes and it has helped him to visualize the amount he was smoking. He does report feeling better since decreasing his tobacco intake. Manases denies any stress or sleep concerns and states that his family and  church have been very supportive. 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    Expected Outcomes Short: Attend cardiac rehab for education and exercise. Long: Develop and maintain positive self care habits.    Continue Psychosocial Services  Follow up required by staff          Psychosocial Re-Evaluation:   Psychosocial Discharge (Final Psychosocial Re-Evaluation):   Vocational Rehabilitation: Provide vocational rehab assistance to qualifying candidates.   Vocational Rehab Evaluation & Intervention:  Vocational Rehab - 09/22/24 1623       Initial Vocational Rehab Evaluation & Intervention   Assessment shows need for Vocational Rehabilitation No          Education: Education Goals: Education classes will be provided on a variety of topics geared toward better understanding of heart health and risk factor modification. Participant will state understanding/return demonstration of topics presented as noted by education test scores.  Learning Barriers/Preferences:  Learning Barriers/Preferences - 09/22/24 1623       Learning Barriers/Preferences   Learning Barriers None    Learning Preferences None          General Cardiac Education Topics:  AED/CPR: - Group verbal and written instruction with the use of models to demonstrate the basic use of the AED with  the basic  ABC's of resuscitation.   Test and Procedures: - Group verbal and visual presentation and models provide information about basic cardiac anatomy and function. Reviews the testing methods done to diagnose heart disease and the outcomes of the test results. Describes the treatment choices: Medical Management, Angioplasty, or Coronary Bypass Surgery for treating various heart conditions including Myocardial Infarction, Angina, Valve Disease, and Cardiac Arrhythmias. Written material provided at class time.   Medication Safety: - Group verbal and visual instruction to review commonly prescribed medications for heart and lung disease. Reviews the medication, class of the drug, and side effects. Includes the steps to properly store meds and maintain the prescription regimen. Written material provided at class time.   Intimacy: - Group verbal instruction through game format to discuss how heart and lung disease can affect sexual intimacy. Written material provided at class time.   Know Your Numbers and Heart Failure: - Group verbal and visual instruction to discuss disease risk factors for cardiac and pulmonary disease and treatment options.  Reviews associated critical values for Overweight/Obesity, Hypertension, Cholesterol, and Diabetes.  Discusses basics of heart failure: signs/symptoms and treatments.  Introduces Heart Failure Zone chart for action plan for heart failure. Written material provided at class time.   Infection Prevention: - Provides verbal and written material to individual with discussion of infection control including proper hand washing and proper equipment cleaning during exercise session. Flowsheet Row Cardiac Rehab from 09/22/2024 in [facility] Cardiac and Pulmonary Rehab  Date 09/22/24  Educator [facility]  Instruction Review Code 1- Verbalizes Understanding    Falls Prevention: - Provides verbal and written material to individual with discussion of falls prevention and  safety. Flowsheet Row Cardiac Rehab from 09/22/2024 in [facility] Cardiac and Pulmonary Rehab  Date 09/22/24  Educator [facility]  Instruction Review Code 1- Verbalizes Understanding    Other: -Provides group and verbal instruction on various topics (see comments)   Knowledge Questionnaire Score:   Core Components/Risk Factors/Patient Goals at Admission:  Personal Goals and Risk Factors at Admission - 09/22/24 1622       Core Components/Risk Factors/Patient Goals on Admission    Weight Management Yes    Intervention Weight Management: Develop a combined nutrition and exercise program designed to reach desired caloric intake, while maintaining appropriate intake of nutrient and fiber, sodium and fats, and appropriate energy expenditure required for the weight goal.;Weight Management: Provide education and appropriate resources to help participant work on and attain dietary goals.;Weight Management/Obesity: Establish reasonable short term and long term weight goals.    Goal Weight: Long Term 220 lb (99.8 kg)    Expected Outcomes Short Term: Continue to assess and modify interventions until short term weight is achieved;Long Term: Adherence to nutrition and physical activity/exercise program aimed toward attainment of established weight goal;Weight Maintenance: Understanding of the daily nutrition guidelines, which includes 25-35% calories from fat, 7% or less cal from saturated fats, less than 200mg  cholesterol, less than 1.5gm of sodium, & 5 or more servings of fruits and vegetables daily;Weight Loss: Understanding of general recommendations for a balanced deficit meal plan, which promotes 1-2 lb weight loss per week and includes a negative energy balance of [phone] kcal/d;Understanding recommendations for meals to include 15-35% energy as protein, 25-35% energy from fat, 35-60% energy from carbohydrates, less than 200mg  of dietary cholesterol, 20-35 gm of total fiber daily;Understanding of distribution of  calorie intake throughout the day with the consumption of 4-5 meals/snacks    Tobacco Cessation Yes    Number of packs per day  0.5    Intervention Assist the participant in steps to quit. Provide individualized education and counseling about committing to Tobacco Cessation, relapse prevention, and pharmacological support that can be provided by physician.;Education officer, environmental, assist with locating and accessing local/national Quit Smoking programs, and support quit date choice.    Expected Outcomes Short Term: Will demonstrate readiness to quit, by selecting a quit date.;Long Term: Complete abstinence from all tobacco products for at least 12 months from quit date.    Hypertension Yes    Intervention Provide education on lifestyle modifcations including regular physical activity/exercise, weight management, moderate sodium restriction and increased consumption of fresh fruit, vegetables, and low fat dairy, alcohol moderation, and smoking cessation.;Monitor prescription use compliance.    Expected Outcomes Short Term: Continued assessment and intervention until BP is < 140/67mm HG in hypertensive participants. < 130/80mm HG in hypertensive participants with diabetes, heart failure or chronic kidney disease.;Long Term: Maintenance of blood pressure at goal levels.    Lipids Yes    Intervention Provide education and support for participant on nutrition & aerobic/resistive exercise along with prescribed medications to achieve LDL 70mg , HDL >40mg .    Expected Outcomes Short Term: Participant states understanding of desired cholesterol values and is compliant with medications prescribed. Participant is following exercise prescription and nutrition guidelines.;Long Term: Cholesterol controlled with medications as prescribed, with individualized exercise RX and with personalized nutrition plan. Value goals: LDL < 70mg , HDL > 40 mg.          Education:Diabetes - Individual verbal and written  instruction to review signs/symptoms of diabetes, desired ranges of glucose level fasting, after meals and with exercise. Acknowledge that pre and post exercise glucose checks will be done for 3 sessions at entry of program.   Core Components/Risk Factors/Patient Goals Review:    Core Components/Risk Factors/Patient Goals at Discharge (Final Review):    ITP Comments:  ITP Comments     Row Name 09/22/24 1607           ITP Comments Completed program orientation and . Initial ITP created and sent for review to Medical Director.    Lewis is a current tobacco user. Intervention for tobacco cessation was provided at the initial medical review. He was asked about readiness to quit and reported that he is not ready to quit, however has significantly decreased his tobacco use in the last few weeks . Patient was advised and educated about tobacco cessation using combination therapy, tobacco cessation classes, quit line, and quit smoking apps. Patient demonstrated understanding of this material. Staff will continue to provide encouragement and follow up with the patient throughout the program.          Comments: Initial ITP

## 2024-09-22 NOTE — Patient Instructions (Signed)
 Patient Instructions  Patient Details  Name: Chase [name] MRN: [id number] Date of Birth: Feb 27, 1956 Referring Provider:  Darron Deatrice LABOR, MD  Below are your personal goals for exercise, nutrition, and risk factors. Our goal is to help you stay on track towards obtaining and maintaining these goals. We will be discussing your progress on these goals with you throughout the program.  Initial Exercise Prescription:  Initial Exercise Prescription - 09/22/24 1600       Date of Initial Exercise RX and Referring Provider   Date 09/22/24    Referring Provider Dr. Deatrice Darron      Oxygen   Maintain Oxygen Saturation 88% or higher      Treadmill   MPH 2.7    Grade 0    Minutes 15    METs 3.07      NuStep   Level 3    SPM 80    Minutes 15    METs 3.12      Rower   Level 3    Watts 30    Minutes 15    METs 3.12      Prescription Details   Duration Progress to 30 minutes of continuous aerobic without signs/symptoms of physical distress      Intensity   THRR 40-80% of Max Heartrate 108-138    Ratings of Perceived Exertion 11-13    Perceived Dyspnea 0-4      Progression   Progression Continue to progress workloads to maintain intensity without signs/symptoms of physical distress.      Resistance Training   Training Prescription Yes    Weight 5lb    Reps 10-15          Exercise Goals: Frequency: Be able to perform aerobic exercise two to three times per week in program working toward 2-5 days per week of home exercise.  Intensity: Work with a perceived exertion of 11 (fairly light) - 15 (hard) while following your exercise prescription.  We will make changes to your prescription with you as you progress through the program.   Duration: Be able to do 30 to 45 minutes of continuous aerobic exercise in addition to a 5 minute warm-up and a 5 minute cool-down routine.   Nutrition Goals: Your personal nutrition goals will be established when you do your nutrition  analysis with the dietician.  The following are general nutrition guidelines to follow: Cholesterol < 200mg /day Sodium < 1500mg /day Fiber: Men over 50 yrs - 30 grams per day  Personal Goals:  Personal Goals and Risk Factors at Admission - 09/22/24 1622       Core Components/Risk Factors/Patient Goals on Admission    Weight Management Yes    Intervention Weight Management: Develop a combined nutrition and exercise program designed to reach desired caloric intake, while maintaining appropriate intake of nutrient and fiber, sodium and fats, and appropriate energy expenditure required for the weight goal.;Weight Management: Provide education and appropriate resources to help participant work on and attain dietary goals.;Weight Management/Obesity: Establish reasonable short term and long term weight goals.    Goal Weight: Long Term 220 lb (99.8 kg)    Expected Outcomes Short Term: Continue to assess and modify interventions until short term weight is achieved;Long Term: Adherence to nutrition and physical activity/exercise program aimed toward attainment of established weight goal;Weight Maintenance: Understanding of the daily nutrition guidelines, which includes 25-35% calories from fat, 7% or less cal from saturated fats, less than 200mg  cholesterol, less than 1.5gm of sodium, & 5 or more  servings of fruits and vegetables daily;Weight Loss: Understanding of general recommendations for a balanced deficit meal plan, which promotes 1-2 lb weight loss per week and includes a negative energy balance of [phone] kcal/d;Understanding recommendations for meals to include 15-35% energy as protein, 25-35% energy from fat, 35-60% energy from carbohydrates, less than 200mg  of dietary cholesterol, 20-35 gm of total fiber daily;Understanding of distribution of calorie intake throughout the day with the consumption of 4-5 meals/snacks    Tobacco Cessation Yes    Number of packs per day 0.5    Intervention Assist the  participant in steps to quit. Provide individualized education and counseling about committing to Tobacco Cessation, relapse prevention, and pharmacological support that can be provided by physician.;Education officer, environmental, assist with locating and accessing local/national Quit Smoking programs, and support quit date choice.    Expected Outcomes Short Term: Will demonstrate readiness to quit, by selecting a quit date.;Long Term: Complete abstinence from all tobacco products for at least 12 months from quit date.    Hypertension Yes    Intervention Provide education on lifestyle modifcations including regular physical activity/exercise, weight management, moderate sodium restriction and increased consumption of fresh fruit, vegetables, and low fat dairy, alcohol moderation, and smoking cessation.;Monitor prescription use compliance.    Expected Outcomes Short Term: Continued assessment and intervention until BP is < 140/1mm HG in hypertensive participants. < 130/57mm HG in hypertensive participants with diabetes, heart failure or chronic kidney disease.;Long Term: Maintenance of blood pressure at goal levels.    Lipids Yes    Intervention Provide education and support for participant on nutrition & aerobic/resistive exercise along with prescribed medications to achieve LDL 70mg , HDL >40mg .    Expected Outcomes Short Term: Participant states understanding of desired cholesterol values and is compliant with medications prescribed. Participant is following exercise prescription and nutrition guidelines.;Long Term: Cholesterol controlled with medications as prescribed, with individualized exercise RX and with personalized nutrition plan. Value goals: LDL < 70mg , HDL > 40 mg.          Exercise Goals and Review:  Exercise Goals     Row Name 09/22/24 1634             Exercise Goals   Increase Physical Activity Yes       Intervention Provide advice, education, support and counseling about  physical activity/exercise needs.;Develop an individualized exercise prescription for aerobic and resistive training based on initial evaluation findings, risk stratification, comorbidities and participant's personal goals.       Expected Outcomes Short Term: Attend rehab on a regular basis to increase amount of physical activity.;Long Term: Add in home exercise to make exercise part of routine and to increase amount of physical activity.;Long Term: Exercising regularly at least 3-5 days a week.       Increase Strength and Stamina Yes       Intervention Provide advice, education, support and counseling about physical activity/exercise needs.;Develop an individualized exercise prescription for aerobic and resistive training based on initial evaluation findings, risk stratification, comorbidities and participant's personal goals.       Expected Outcomes Short Term: Increase workloads from initial exercise prescription for resistance, speed, and METs.;Long Term: Improve cardiorespiratory fitness, muscular endurance and strength as measured by increased METs and functional capacity ( );Short Term: Perform resistance training exercises routinely during rehab and add in resistance training at home       Able to understand and use rate of perceived exertion (RPE) scale Yes       Intervention Provide  education and explanation on how to use RPE scale       Expected Outcomes Short Term: Able to use RPE daily in rehab to express subjective intensity level;Long Term:  Able to use RPE to guide intensity level when exercising independently       Able to understand and use Dyspnea scale Yes       Intervention Provide education and explanation on how to use Dyspnea scale       Expected Outcomes Short Term: Able to use Dyspnea scale daily in rehab to express subjective sense of shortness of breath during exertion;Long Term: Able to use Dyspnea scale to guide intensity level when exercising independently       Knowledge  and understanding of Target Heart Rate Range (THRR) Yes       Intervention Provide education and explanation of THRR including how the numbers were predicted and where they are located for reference       Expected Outcomes Short Term: Able to state/look up THRR;Short Term: Able to use daily as guideline for intensity in rehab;Long Term: Able to use THRR to govern intensity when exercising independently       Able to check pulse independently Yes       Intervention Review the importance of being able to check your own pulse for safety during independent exercise;Provide education and demonstration on how to check pulse in carotid and radial arteries.       Expected Outcomes Short Term: Able to explain why pulse checking is important during independent exercise;Long Term: Able to check pulse independently and accurately       Understanding of Exercise Prescription Yes       Intervention Provide education, explanation, and written materials on patient's individual exercise prescription       Expected Outcomes Short Term: Able to explain program exercise prescription;Long Term: Able to explain home exercise prescription to exercise independently          Copy of goals given to participant.

## 2024-09-26 ENCOUNTER — Encounter

## 2024-09-26 DIAGNOSIS — Z952 Presence of prosthetic heart valve: Secondary | ICD-10-CM

## 2024-09-26 NOTE — Progress Notes (Signed)
 Daily Session Note  Patient Details  Name: Chase [name] MRN: [id number] Date of Birth: 06-Jan-1956 Referring Provider:   Flowsheet Row Cardiac Rehab from 09/22/2024 in [facility] Cardiac and Pulmonary Rehab  Referring Provider Dr. [name]    Encounter Date: 09/26/2024  Check In:  Session Check In - 09/26/24 0847       Check-In   Supervising physician immediately available to respond to emergencies See telemetry face sheet for immediately available ER MD    Location [facility]-Cardiac & Pulmonary Rehab    Staff Present Burnard Davenport RN,BSN,MPA;Joseph [facility] Dyane BS, ACSM CEP, Exercise Physiologist;Jason Elnor RDN,LDN    Virtual Visit No    Medication changes reported     No    Fall or balance concerns reported    No    Warm-up and Cool-down Performed on first and last piece of equipment    Resistance Training Performed Yes    VAD Patient? No    PAD/SET Patient? No      Pain Assessment   Currently in Pain? No/denies             Social History   Tobacco Use  Smoking Status Every Day   Current packs/day: 1.50   Average packs/day: 1.5 packs/day for 54.5 years (81.8 ttl pk-yrs)   Types: Cigarettes   Start date: 03/27/1970  Smokeless Tobacco Never  Tobacco Comments   Smokes 1.5-2 packs per day     Goals Met:  Independence with exercise equipment Exercise tolerated well No report of concerns or symptoms today Strength training completed today  Goals Unmet:  Not Applicable  Comments: First full day of exercise!  Patient was oriented to gym and equipment including functions, settings, policies, and procedures.  Patient's individual exercise prescription and treatment plan were reviewed.  All starting workloads were established based on the results of the 6 minute walk test done at initial orientation visit.  The plan for exercise progression was also introduced and progression will be customized based on patient's performance and goals.    Dr. Oneil Pinal  is Medical Director for [facility] Cardiac Rehabilitation.  Dr. Fuad Aleskerov is Medical Director for [facility] Pulmonary Rehabilitation.

## 2024-09-28 ENCOUNTER — Encounter

## 2024-09-28 DIAGNOSIS — Z952 Presence of prosthetic heart valve: Secondary | ICD-10-CM

## 2024-09-28 NOTE — Progress Notes (Signed)
 Daily Session Note  Patient Details  Name: Chase [name] MRN: [id number] Date of Birth: April 04, 1956 Referring Provider:   Flowsheet Row Cardiac Rehab from 09/22/2024 in [facility] Cardiac and Pulmonary Rehab  Referring Provider Dr. [name]    Encounter Date: 09/28/2024  Check In:  Session Check In - 09/28/24 0936       Check-In   Supervising physician immediately available to respond to emergencies See telemetry face sheet for immediately available ER MD    Location [facility]-Cardiac & Pulmonary Rehab    Staff Present Burnard Davenport RN,BSN,MPA;Joseph [facility] RCP,RRT,BSRT;Laura Cates RN,BSN;Margaret Best, MS, Exercise Physiologist    Virtual Visit No    Medication changes reported     No    Fall or balance concerns reported    No    Warm-up and Cool-down Performed on first and last piece of equipment    Resistance Training Performed Yes    VAD Patient? No    PAD/SET Patient? No      Pain Assessment   Currently in Pain? No/denies             Social History   Tobacco Use  Smoking Status Every Day   Current packs/day: 1.50   Average packs/day: 1.5 packs/day for 54.5 years (81.8 ttl pk-yrs)   Types: Cigarettes   Start date: 03/27/1970  Smokeless Tobacco Never  Tobacco Comments   Smokes 1.5-2 packs per day     Goals Met:  Independence with exercise equipment Exercise tolerated well No report of concerns or symptoms today Strength training completed today  Goals Unmet:  Not Applicable  Comments: Pt able to follow exercise prescription today without complaint.  Will continue to monitor for progression.    Dr. Oneil Pinal is Medical Director for [facility] Cardiac Rehabilitation.  Dr. Fuad Aleskerov is Medical Director for [facility] Pulmonary Rehabilitation.

## 2024-09-30 ENCOUNTER — Encounter: Admitting: Emergency Medicine

## 2024-09-30 DIAGNOSIS — Z952 Presence of prosthetic heart valve: Secondary | ICD-10-CM

## 2024-09-30 NOTE — Progress Notes (Signed)
 Daily Session Note  Patient Details  Name: Chase [name] MRN: [id number] Date of Birth: 02/18/1956 Referring Provider:   Flowsheet Row Cardiac Rehab from 09/22/2024 in [facility] Cardiac and Pulmonary Rehab  Referring Provider Dr. [name]    Encounter Date: 09/30/2024  Check In:  Session Check In - 09/30/24 0954       Check-In   Supervising physician immediately available to respond to emergencies See telemetry face sheet for immediately available ER MD    Location [facility]-Cardiac & Pulmonary Rehab    Staff Present Leita Franks RN,BSN;Joseph [facility] BS, Exercise Physiologist;Noah Tickle, BS, Exercise Physiologist    Virtual Visit No    Medication changes reported     No    Fall or balance concerns reported    No    Warm-up and Cool-down Performed on first and last piece of equipment    Resistance Training Performed Yes    VAD Patient? No    PAD/SET Patient? No      Pain Assessment   Currently in Pain? No/denies             Tobacco Use History[1]  Goals Met:  Independence with exercise equipment Exercise tolerated well No report of concerns or symptoms today Strength training completed today  Goals Unmet:  Not Applicable  Comments: Pt able to follow exercise prescription today without complaint.  Will continue to monitor for progression.    Dr. Oneil Pinal is Medical Director for [facility] Cardiac Rehabilitation.  Dr. Fuad Aleskerov is Medical Director for [facility] Pulmonary Rehabilitation.    [1]  Social History Tobacco Use  Smoking Status Every Day   Current packs/day: 1.50   Average packs/day: 1.5 packs/day for 54.5 years (81.8 ttl pk-yrs)   Types: Cigarettes   Start date: 03/27/1970  Smokeless Tobacco Never  Tobacco Comments   Smokes 1.5-2 packs per day

## 2024-10-03 ENCOUNTER — Encounter

## 2024-10-03 DIAGNOSIS — Z952 Presence of prosthetic heart valve: Secondary | ICD-10-CM | POA: Diagnosis not present

## 2024-10-03 NOTE — Progress Notes (Signed)
 Daily Session Note  Patient Details  Name: Chase [name] MRN: [id number] Date of Birth: 10-03-1956 Referring Provider:   Flowsheet Row Cardiac Rehab from 09/22/2024 in [facility] Cardiac and Pulmonary Rehab  Referring Provider Dr. [name]    Encounter Date: 10/03/2024  Check In:  Session Check In - 10/03/24 0954       Check-In   Supervising physician immediately available to respond to emergencies See telemetry face sheet for immediately available ER MD    Location [facility]-Cardiac & Pulmonary Rehab    Staff Present Burnard Davenport RN,BSN,MPA;Maxon Burnell BS, Exercise Physiologist;Joseph [facility] Dyane BS, ACSM CEP, Exercise Physiologist    Virtual Visit No    Medication changes reported     No    Fall or balance concerns reported    No    Warm-up and Cool-down Performed on first and last piece of equipment    Resistance Training Performed Yes    VAD Patient? No    PAD/SET Patient? No      Pain Assessment   Currently in Pain? No/denies             Tobacco Use History[1]  Goals Met:  Independence with exercise equipment Exercise tolerated well No report of concerns or symptoms today Strength training completed today  Goals Unmet:  Not Applicable  Comments: Pt able to follow exercise prescription today without complaint.  Will continue to monitor for progression.    Dr. Oneil Pinal is Medical Director for [facility] Cardiac Rehabilitation.  Dr. Fuad Aleskerov is Medical Director for [facility] Pulmonary Rehabilitation.    [1]  Social History Tobacco Use  Smoking Status Every Day   Current packs/day: 1.50   Average packs/day: 1.5 packs/day for 54.5 years (81.8 ttl pk-yrs)   Types: Cigarettes   Start date: 03/27/1970  Smokeless Tobacco Never  Tobacco Comments   Smokes 1.5-2 packs per day

## 2024-10-04 ENCOUNTER — Ambulatory Visit
Admission: RE | Admit: 2024-10-04 | Discharge: 2024-10-04 | Disposition: A | Source: Ambulatory Visit | Attending: Physician Assistant

## 2024-10-04 DIAGNOSIS — I119 Hypertensive heart disease without heart failure: Secondary | ICD-10-CM | POA: Insufficient documentation

## 2024-10-04 DIAGNOSIS — E785 Hyperlipidemia, unspecified: Secondary | ICD-10-CM | POA: Insufficient documentation

## 2024-10-04 DIAGNOSIS — I77810 Thoracic aortic ectasia: Secondary | ICD-10-CM | POA: Insufficient documentation

## 2024-10-04 DIAGNOSIS — Z952 Presence of prosthetic heart valve: Secondary | ICD-10-CM

## 2024-10-04 DIAGNOSIS — I77819 Aortic ectasia, unspecified site: Secondary | ICD-10-CM | POA: Insufficient documentation

## 2024-10-04 LAB — ECHOCARDIOGRAM COMPLETE
AR max vel: 2.31 cm2
AV Area VTI: 2.43 cm2
AV Area mean vel: 2.42 cm2
AV Mean grad: 7.3 mmHg
AV Peak grad: 15.1 mmHg
Ao pk vel: 1.94 m/s
Area-P 1/2: 4.12 cm2
S' Lateral: 3.2 cm

## 2024-10-05 ENCOUNTER — Ambulatory Visit: Payer: Self-pay | Admitting: Physician Assistant

## 2024-10-05 ENCOUNTER — Encounter

## 2024-10-05 DIAGNOSIS — Z952 Presence of prosthetic heart valve: Secondary | ICD-10-CM

## 2024-10-05 NOTE — Progress Notes (Signed)
 Cardiac Individual Treatment Plan  Patient Details  Name: Chase [name] MRN: [id number] Date of Birth: [birth year] Referring Provider:   Flowsheet Row Cardiac Rehab from 09/22/2024 in [facility] Cardiac and Pulmonary Rehab  Referring Provider Dr. [name]    Initial Encounter Date:  Flowsheet Row Cardiac Rehab from 09/22/2024 in [facility] Cardiac and Pulmonary Rehab  Date 09/22/24    Visit Diagnosis: S/P TAVR (transcatheter aortic valve replacement)  Patient's Home Medications on Admission: Current Medications[1]  Past Medical History: Past Medical History:  Diagnosis Date   Arthritis    maybe - hands   Chest pain    a. ETT 06/14/2015: no st segment or T-waves changes during stress, mildly reduced exercise capacity, hypertensive response to exercise   Chicken pox    Chronic venous insufficiency    a. improved with support hose.   Erectile dysfunction    Essential hypertension    CONTROLLED ON MEDS   History of echocardiogram    a. 03/2016 Echo: EF 55-60%, no rwma, mild AS, triv AI, mildly dil Ao.   HLD (hyperlipidemia)    Measles    Mumps    Polysubstance abuse (HCC)    a. 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 Daniel and Dr. Verlin   Wears dentures    full upper    Tobacco Use: Tobacco Use History[2]  Labs: Review Flowsheet  More data exists      Latest Ref Rng & Units 09/27/2021 06/29/2023 06/23/2024 07/01/2024 09/06/2024  Labs for ITP Cardiac and Pulmonary Rehab  Cholestrol 100 - 199 mg/dL [phone]  [phone]  - -  LDL (calc) 0 - 99 mg/dL 78  60  56  - -  HDL-C >39 mg/dL 56  51  60  - -  Trlycerides 0 - 149 mg/dL 69  83  95  - -  PH, Arterial 7.35 - 7.45 - - - 7.380  -  PCO2 arterial 32 - 48 mmHg - - - 43.0  -  Bicarbonate 20.0 - 28.0 mmol/L - - - 27.4  25.4  -  TCO2 22 - 32 mmol/L - - - 29  27  22  23    O2 Saturation % - - - 68  96  -     Details       Multiple values from one day are sorted in reverse-chronological order          Exercise Target Goals: Exercise Program Goal: Individual exercise prescription set using results from initial 6 min walk test and THRR while considering  patients activity barriers and safety.   Exercise Prescription Goal: Initial exercise prescription builds to 30-45 minutes a day of aerobic activity, 2-3 days per week.  Home exercise guidelines will be given to patient during program as part of exercise prescription that the participant will acknowledge.   Education: Aerobic Exercise: - Group verbal and visual presentation on the components of exercise prescription. Introduces F.I.T.T principle from ACSM for exercise prescriptions.  Reviews F.I.T.T. principles of aerobic exercise including progression. Written material provided at class time.   Education: Resistance Exercise: - Group verbal and visual presentation on the components of exercise prescription. Introduces F.I.T.T principle from ACSM for exercise prescriptions  Reviews F.I.T.T. principles of resistance exercise including progression. Written material provided at class time.    Education: Exercise & Equipment Safety: -  Individual verbal instruction and demonstration of equipment use and safety with use of the equipment. Flowsheet Row Cardiac Rehab from 09/22/2024 in [facility] Cardiac and Pulmonary Rehab  Date 09/22/24  Educator [facility]  Instruction Review Code 1- Verbalizes Understanding    Education: Exercise Physiology & General Exercise Guidelines: - Group verbal and written instruction with models to review the exercise physiology of the cardiovascular system and associated critical values. Provides general exercise guidelines with specific guidelines to those with heart or lung disease. Written material provided at class time.   Education: Flexibility, Balance, Mind/Body Relaxation: - Group verbal and visual presentation  with interactive activity on the components of exercise prescription. Introduces F.I.T.T principle from ACSM for exercise prescriptions. Reviews F.I.T.T. principles of flexibility and balance exercise training including progression. Also discusses the mind body connection.  Reviews various relaxation techniques to help reduce and manage stress (i.e. Deep breathing, progressive muscle relaxation, and visualization). Balance handout provided to take home. Written material provided at class time.   Activity Barriers & Risk Stratification:  Activity Barriers & Cardiac Risk Stratification - 09/22/24 1613       Activity Barriers & Cardiac Risk Stratification   Activity Barriers None    Cardiac Risk Stratification Moderate          6 Minute Walk:  6 Minute Walk     Row Name 09/22/24 1615         6 Minute Walk   Phase Initial     Distance 1435 feet     Walk Time 6 minutes     # of Rest Breaks 0     MPH 2.7     METS 3.12     RPE 9     Perceived Dyspnea  1     VO2 Peak 10.9     Symptoms Yes (comment)     Comments tightness in chest     Resting HR 78 bpm     Resting BP 124/64     Resting Oxygen Saturation  97 %     Exercise Oxygen Saturation  during 6 min walk 97 %     Max Ex. HR 88 bpm     Max Ex. BP 138/62     2 Minute Post BP 122/64        Oxygen Initial Assessment:   Oxygen Re-Evaluation:   Oxygen Discharge (Final Oxygen Re-Evaluation):   Initial Exercise Prescription:  Initial Exercise Prescription - 09/22/24 1600       Date of Initial Exercise RX and Referring Provider   Date 09/22/24    Referring Provider Dr. [name]      Oxygen   Maintain Oxygen Saturation 88% or higher      Treadmill   MPH 2.7    Grade 0    Minutes 15    METs 3.07      NuStep   Level 3    SPM 80    Minutes 15    METs 3.12      Rower   Level 3    Watts 30    Minutes 15    METs 3.12      Prescription Details   Duration Progress to 30 minutes of continuous aerobic  without signs/symptoms of physical distress      Intensity   THRR 40-80% of Max Heartrate 108-138    Ratings of Perceived Exertion 11-13    Perceived Dyspnea 0-4      Progression   Progression Continue to progress workloads to  maintain intensity without signs/symptoms of physical distress.      Resistance Training   Training Prescription Yes    Weight 5lb    Reps 10-15          Perform Capillary Blood Glucose checks as needed.  Exercise Prescription Changes:   Exercise Prescription Changes     Row Name 09/22/24 1600 10/03/24 1100           Response to Exercise   Blood Pressure (Admit) 124/64 112/60      Blood Pressure (Exercise) 138/62 138/70      Blood Pressure (Exit) 122/64 112/58      Heart Rate (Admit) 78 bpm 71 bpm      Heart Rate (Exercise) 88 bpm 121 bpm      Heart Rate (Exit) 80 bpm 83 bpm      Oxygen Saturation (Admit) 97 % --      Oxygen Saturation (Exercise) 97 % --      Oxygen Saturation (Exit) 97 % --      Rating of Perceived Exertion (Exercise) 9 15      Perceived Dyspnea (Exercise) 1 0      Symptoms chest tightness none      Comments results first 2 weeks of exercise      Duration -- Progress to 30 minutes of  aerobic without signs/symptoms of physical distress      Intensity -- THRR unchanged        Progression   Progression -- Continue to progress workloads to maintain intensity without signs/symptoms of physical distress.      Average METs -- 3.4        Resistance Training   Weight -- 5lb      Reps -- 10-15        Interval Training   Interval Training -- No        Treadmill   MPH -- 2.8      Grade -- 1      Minutes -- 15      METs -- 3.53        NuStep   Level -- 2  T6      Minutes -- 15      METs -- 2.7        REL-XR   Level -- 1      Minutes -- 15      METs -- 3.5        Oxygen   Maintain Oxygen Saturation -- 88% or higher         Exercise Comments:   Exercise Comments     Row Name 09/26/24 0848            Exercise Comments First full day of exercise!  Patient was oriented to gym and equipment including functions, settings, policies, and procedures.  Patient's individual exercise prescription and treatment plan were reviewed.  All starting workloads were established based on the results of the 6 minute walk test done at initial orientation visit.  The plan for exercise progression was also introduced and progression will be customized based on patient's performance and goals.          Exercise Goals and Review:   Exercise Goals     Row Name 09/22/24 1634             Exercise Goals   Increase Physical Activity Yes       Intervention Provide advice, education, support and counseling about physical activity/exercise needs.;Develop an individualized exercise prescription  for aerobic and resistive training based on initial evaluation findings, risk stratification, comorbidities and participant's personal goals.       Expected Outcomes Short Term: Attend rehab on a regular basis to increase amount of physical activity.;Long Term: Add in home exercise to make exercise part of routine and to increase amount of physical activity.;Long Term: Exercising regularly at least 3-5 days a week.       Increase Strength and Stamina Yes       Intervention Provide advice, education, support and counseling about physical activity/exercise needs.;Develop an individualized exercise prescription for aerobic and resistive training based on initial evaluation findings, risk stratification, comorbidities and participant's personal goals.       Expected Outcomes Short Term: Increase workloads from initial exercise prescription for resistance, speed, and METs.;Long Term: Improve cardiorespiratory fitness, muscular endurance and strength as measured by increased METs and functional capacity ( );Short Term: Perform resistance training exercises routinely during rehab and add in resistance training at home       Able to  understand and use rate of perceived exertion (RPE) scale Yes       Intervention Provide education and explanation on how to use RPE scale       Expected Outcomes Short Term: Able to use RPE daily in rehab to express subjective intensity level;Long Term:  Able to use RPE to guide intensity level when exercising independently       Able to understand and use Dyspnea scale Yes       Intervention Provide education and explanation on how to use Dyspnea scale       Expected Outcomes Short Term: Able to use Dyspnea scale daily in rehab to express subjective sense of shortness of breath during exertion;Long Term: Able to use Dyspnea scale to guide intensity level when exercising independently       Knowledge and understanding of Target Heart Rate Range (THRR) Yes       Intervention Provide education and explanation of THRR including how the numbers were predicted and where they are located for reference       Expected Outcomes Short Term: Able to state/look up THRR;Short Term: Able to use daily as guideline for intensity in rehab;Long Term: Able to use THRR to govern intensity when exercising independently       Able to check pulse independently Yes       Intervention Review the importance of being able to check your own pulse for safety during independent exercise;Provide education and demonstration on how to check pulse in carotid and radial arteries.       Expected Outcomes Short Term: Able to explain why pulse checking is important during independent exercise;Long Term: Able to check pulse independently and accurately       Understanding of Exercise Prescription Yes       Intervention Provide education, explanation, and written materials on patient's individual exercise prescription       Expected Outcomes Short Term: Able to explain program exercise prescription;Long Term: Able to explain home exercise prescription to exercise independently          Exercise Goals Re-Evaluation :  Exercise Goals  Re-Evaluation     Row Name 09/26/24 0848 10/03/24 1127           Exercise Goal Re-Evaluation   Exercise Goals Review Increase Physical Activity;Able to understand and use rate of perceived exertion (RPE) scale;Knowledge and understanding of Target Heart Rate Range (THRR);Understanding of Exercise Prescription;Increase Strength and Stamina;Able to check pulse independently;Able to  understand and use Dyspnea scale Increase Physical Activity;Increase Strength and Stamina;Understanding of Exercise Prescription      Comments Reviewed RPE and dyspnea scale, THR and program prescription with pt today.  Pt voiced understanding and was given a copy of goals to take home. Rajeev is off to a good start in the program, and was able to attend his first 3 sessions during this review period. During those sessions he was able to use the treadmill at a speed of 2. and 1% incline, and use the T6 nustep at level 2. We will continue to monitor his progress in the program.      Expected Outcomes Short: Use RPE daily to regulate intensity. Long: Follow program prescription in THR. Short: Continue to follow exercise prescription. Long: Continue exercise to improve strength and stamina         Discharge Exercise Prescription (Final Exercise Prescription Changes):  Exercise Prescription Changes - 10/03/24 1100       Response to Exercise   Blood Pressure (Admit) 112/60    Blood Pressure (Exercise) 138/70    Blood Pressure (Exit) 112/58    Heart Rate (Admit) 71 bpm    Heart Rate (Exercise) 121 bpm    Heart Rate (Exit) 83 bpm    Rating of Perceived Exertion (Exercise) 15    Perceived Dyspnea (Exercise) 0    Symptoms none    Comments first 2 weeks of exercise    Duration Progress to 30 minutes of  aerobic without signs/symptoms of physical distress    Intensity THRR unchanged      Progression   Progression Continue to progress workloads to maintain intensity without signs/symptoms of physical distress.     Average METs 3.4      Resistance Training   Weight 5lb    Reps 10-15      Interval Training   Interval Training No      Treadmill   MPH 2.8    Grade 1    Minutes 15    METs 3.53      NuStep   Level 2   T6   Minutes 15    METs 2.7      REL-XR   Level 1    Minutes 15    METs 3.5      Oxygen   Maintain Oxygen Saturation 88% or higher          Nutrition:  Target Goals: Understanding of nutrition guidelines, daily intake of sodium 1500mg , cholesterol 200mg , calories 30% from fat and 7% or less from saturated fats, daily to have 5 or more servings of fruits and vegetables.  Education: Nutrition 1 -Group instruction provided by verbal, written material, interactive activities, discussions, models, and posters to present general guidelines for heart healthy nutrition including macronutrients, label reading, and promoting whole foods over processed counterparts. Education serves as pensions consultant of discussion of heart healthy eating for all. Written material provided at class time.    Education: Nutrition 2 -Group instruction provided by verbal, written material, interactive activities, discussions, models, and posters to present general guidelines for heart healthy nutrition including sodium, cholesterol, and saturated fat. Providing guidance of habit forming to improve blood pressure, cholesterol, and body weight. Written material provided at class time.     Biometrics:  Pre Biometrics - 09/22/24 1635       Pre Biometrics   Height 6' 1.1 (1.857 m)    Weight 230 lb 1.6 oz (104.4 kg)    Waist Circumference 44 inches  Hip Circumference 42 inches    Waist to Hip Ratio 1.05 %    BMI (Calculated) 30.27    Single Leg Stand 30 seconds           Nutrition Therapy Plan and Nutrition Goals:   Nutrition Assessments:  MEDIFICTS Score Key: >=70 Need to make dietary changes  40-70 Heart Healthy Diet <= 40 Therapeutic Level Cholesterol Diet  Flowsheet Row Cardiac  Rehab from 09/22/2024 in [facility] Cardiac and Pulmonary Rehab  Picture Your Plate Total Score on Admission 46   Picture Your Plate Scores: <59 Unhealthy dietary pattern with much room for improvement. 41-50 Dietary pattern unlikely to meet recommendations for good health and room for improvement. 51-60 More healthful dietary pattern, with some room for improvement.  >60 Healthy dietary pattern, although there may be some specific behaviors that could be improved.    Nutrition Goals Re-Evaluation:   Nutrition Goals Discharge (Final Nutrition Goals Re-Evaluation):   Psychosocial: Target Goals: Acknowledge presence or absence of significant depression and/or stress, maximize coping skills, provide positive support system. Participant is able to verbalize types and ability to use techniques and skills needed for reducing stress and depression.   Education: Stress, Anxiety, and Depression - Group verbal and visual presentation to define topics covered.  Reviews how body is impacted by stress, anxiety, and depression.  Also discusses healthy ways to reduce stress and to treat/manage anxiety and depression. Written material provided at class time.   Education: Sleep Hygiene -Provides group verbal and written instruction about how sleep can affect your health.  Define sleep hygiene, discuss sleep cycles and impact of sleep habits. Review good sleep hygiene tips.   Initial Review & Psychosocial Screening:  Initial Psych Review & Screening - 09/22/24 1623       Initial Review   Current issues with None Identified      Family Dynamics   Good Support System? Yes   wife, children and church     Barriers   Psychosocial barriers to participate in program There are no identifiable barriers or psychosocial needs.;The patient should benefit from training in stress management and relaxation.      Screening Interventions   Interventions Encouraged to exercise;To provide support and resources with  identified psychosocial needs;Provide feedback about the scores to participant    Expected Outcomes Short Term goal: Utilizing psychosocial counselor, staff and physician to assist with identification of specific Stressors or current issues interfering with healing process. Setting desired goal for each stressor or current issue identified.;Long Term Goal: Stressors or current issues are controlled or eliminated.;Short Term goal: Identification and review with participant of any Quality of Life or Depression concerns found by scoring the questionnaire.;Long Term goal: The participant improves quality of Life and PHQ9 Scores as seen by post scores and/or verbalization of changes          Quality of Life Scores:   Quality of Life - 09/22/24 1638       Quality of Life   Select Quality of Life      Quality of Life Scores   Health/Function Pre 26.6 %    Socioeconomic Pre 21.44 %    Psych/Spiritual Pre 24.43 %    Family Pre 30 %    GLOBAL Pre 25.47 %         Scores of 19 and below usually indicate a poorer quality of life in these areas.  A difference of  2-3 points is a clinically meaningful difference.  A difference of 2-3  points in the total score of the Quality of Life Index has been associated with significant improvement in overall quality of life, self-image, physical symptoms, and general health in studies assessing change in quality of life.  PHQ-9: Review Flowsheet       09/22/2024  Depression screen PHQ 2/9  Decreased Interest 0  Down, Depressed, Hopeless 0  PHQ - 2 Score 0  Altered sleeping 0  Tired, decreased energy 0  Change in appetite 0  Feeling bad or failure about yourself  0  Trouble concentrating 0  Moving slowly or fidgety/restless 0  Suicidal thoughts 0  PHQ-9 Score 0   Interpretation of Total Score  Total Score Depression Severity:  1-4 = Minimal depression, 5-9 = Mild depression, 10-14 = Moderate depression, 15-19 = Moderately severe depression, 20-27 =  Severe depression   Psychosocial Evaluation and Intervention:  Psychosocial Evaluation - 09/22/24 1624       Psychosocial Evaluation & Interventions   Interventions Stress management education;Relaxation education;Encouraged to exercise with the program and follow exercise prescription    Comments Lothar is looking forward to starting cardiac rehab after his recent TAVR. He has a long history of smoking 1-2 packs of cigarettes/day, however has recently decreased his smoking to about 14/day and only outside. He states he is writing down every time he smokes and it has helped him to visualize the amount he was smoking. He does report feeling better since decreasing his tobacco intake. Davieon denies any stress or sleep concerns and states that his family and  church have been very supportive. 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    Expected Outcomes Short: Attend cardiac rehab for education and exercise. Long: Develop and maintain positive self care habits.    Continue Psychosocial Services  Follow up required by staff          Psychosocial Re-Evaluation:   Psychosocial Discharge (Final Psychosocial Re-Evaluation):   Vocational Rehabilitation: Provide vocational rehab assistance to qualifying candidates.   Vocational Rehab Evaluation & Intervention:  Vocational Rehab - 09/22/24 1623       Initial Vocational Rehab Evaluation & Intervention   Assessment shows need for Vocational Rehabilitation No          Education: Education Goals: Education classes will be provided on a variety of topics geared toward better understanding of heart health and risk factor modification. Participant will state understanding/return demonstration of topics presented as noted by education test scores.  Learning Barriers/Preferences:  Learning Barriers/Preferences - 09/22/24 1623       Learning Barriers/Preferences   Learning  Barriers None    Learning Preferences None          General Cardiac Education Topics:  AED/CPR: - Group verbal and written instruction with the use of models to demonstrate the basic use of the AED with the basic ABC's of resuscitation.   Test and Procedures: - Group verbal and visual presentation and models provide information about basic cardiac anatomy and function. Reviews the testing methods done to diagnose heart disease and the outcomes of the test results. Describes the treatment choices: Medical Management, Angioplasty, or Coronary Bypass Surgery for treating various heart conditions including Myocardial Infarction, Angina, Valve Disease, and Cardiac Arrhythmias. Written material provided at class time.   Medication Safety: - Group verbal and visual instruction to review commonly prescribed medications for heart and lung disease. Reviews the medication, class of the drug, and side effects. Includes  the steps to properly store meds and maintain the prescription regimen. Written material provided at class time.   Intimacy: - Group verbal instruction through game format to discuss how heart and lung disease can affect sexual intimacy. Written material provided at class time.   Know Your Numbers and Heart Failure: - Group verbal and visual instruction to discuss disease risk factors for cardiac and pulmonary disease and treatment options.  Reviews associated critical values for Overweight/Obesity, Hypertension, Cholesterol, and Diabetes.  Discusses basics of heart failure: signs/symptoms and treatments.  Introduces Heart Failure Zone chart for action plan for heart failure. Written material provided at class time.   Infection Prevention: - Provides verbal and written material to individual with discussion of infection control including proper hand washing and proper equipment cleaning during exercise session. Flowsheet Row Cardiac Rehab from 09/22/2024 in [facility] Cardiac and Pulmonary  Rehab  Date 09/22/24  Educator [facility]  Instruction Review Code 1- Verbalizes Understanding    Falls Prevention: - Provides verbal and written material to individual with discussion of falls prevention and safety. Flowsheet Row Cardiac Rehab from 09/22/2024 in [facility] Cardiac and Pulmonary Rehab  Date 09/22/24  Educator [facility]  Instruction Review Code 1- Verbalizes Understanding    Other: -Provides group and verbal instruction on various topics (see comments)   Knowledge Questionnaire Score:  Knowledge Questionnaire Score - 09/22/24 1637       Knowledge Questionnaire Score   Pre Score 21/26          Core Components/Risk Factors/Patient Goals at Admission:  Personal Goals and Risk Factors at Admission - 09/22/24 1622       Core Components/Risk Factors/Patient Goals on Admission    Weight Management Yes    Intervention Weight Management: Develop a combined nutrition and exercise program designed to reach desired caloric intake, while maintaining appropriate intake of nutrient and fiber, sodium and fats, and appropriate energy expenditure required for the weight goal.;Weight Management: Provide education and appropriate resources to help participant work on and attain dietary goals.;Weight Management/Obesity: Establish reasonable short term and long term weight goals.    Goal Weight: Long Term 220 lb (99.8 kg)    Expected Outcomes Short Term: Continue to assess and modify interventions until short term weight is achieved;Long Term: Adherence to nutrition and physical activity/exercise program aimed toward attainment of established weight goal;Weight Maintenance: Understanding of the daily nutrition guidelines, which includes 25-35% calories from fat, 7% or less cal from saturated fats, less than 200mg  cholesterol, less than 1.5gm of sodium, & 5 or more servings of fruits and vegetables daily;Weight Loss: Understanding of general recommendations for a balanced deficit meal plan, which promotes  1-2 lb weight loss per week and includes a negative energy balance of [phone] kcal/d;Understanding recommendations for meals to include 15-35% energy as protein, 25-35% energy from fat, 35-60% energy from carbohydrates, less than 200mg  of dietary cholesterol, 20-35 gm of total fiber daily;Understanding of distribution of calorie intake throughout the day with the consumption of 4-5 meals/snacks    Tobacco Cessation Yes    Number of packs per day 0.5    Intervention Assist the participant in steps to quit. Provide individualized education and counseling about committing to Tobacco Cessation, relapse prevention, and pharmacological support that can be provided by physician.;Education officer, environmental, assist with locating and accessing local/national Quit Smoking programs, and support quit date choice.    Expected Outcomes Short Term: Will demonstrate readiness to quit, by selecting a quit date.;Long Term: Complete abstinence from all tobacco products for  at least 12 months from quit date.    Hypertension Yes    Intervention Provide education on lifestyle modifcations including regular physical activity/exercise, weight management, moderate sodium restriction and increased consumption of fresh fruit, vegetables, and low fat dairy, alcohol moderation, and smoking cessation.;Monitor prescription use compliance.    Expected Outcomes Short Term: Continued assessment and intervention until BP is < 140/34mm HG in hypertensive participants. < 130/34mm HG in hypertensive participants with diabetes, heart failure or chronic kidney disease.;Long Term: Maintenance of blood pressure at goal levels.    Lipids Yes    Intervention Provide education and support for participant on nutrition & aerobic/resistive exercise along with prescribed medications to achieve LDL 70mg , HDL >40mg .    Expected Outcomes Short Term: Participant states understanding of desired cholesterol values and is compliant with medications  prescribed. Participant is following exercise prescription and nutrition guidelines.;Long Term: Cholesterol controlled with medications as prescribed, with individualized exercise RX and with personalized nutrition plan. Value goals: LDL < 70mg , HDL > 40 mg.          Education:Diabetes - Individual verbal and written instruction to review signs/symptoms of diabetes, desired ranges of glucose level fasting, after meals and with exercise. Acknowledge that pre and post exercise glucose checks will be done for 3 sessions at entry of program.   Core Components/Risk Factors/Patient Goals Review:    Core Components/Risk Factors/Patient Goals at Discharge (Final Review):    ITP Comments:  ITP Comments     Row Name 09/22/24 1607 09/26/24 0848 10/05/24 0811       ITP Comments Completed program orientation and . Initial ITP created and sent for review to Medical Director.    Rhyatt is a current tobacco user. Intervention for tobacco cessation was provided at the initial medical review. He was asked about readiness to quit and reported that he is not ready to quit, however has significantly decreased his tobacco use in the last few weeks . Patient was advised and educated about tobacco cessation using combination therapy, tobacco cessation classes, quit line, and quit smoking apps. Patient demonstrated understanding of this material. Staff will continue to provide encouragement and follow up with the patient throughout the program. First full day of exercise!  Patient was oriented to gym and equipment including functions, settings, policies, and procedures.  Patient's individual exercise prescription and treatment plan were reviewed.  All starting workloads were established based on the results of the 6 minute walk test done at initial orientation visit.  The plan for exercise progression was also introduced and progression will be customized based on patient's performance and goals. 30 Day review  completed. Medical Director ITP review done, changes made as directed, and signed approval by Medical Director. New to program.        Comments: 30 day review     [1]  Current Outpatient Medications:    amLODipine  (NORVASC ) 10 MG tablet, Take 1 tablet (10 mg total) by mouth daily., Disp: 90 tablet, Rfl: 3   amoxicillin  (AMOXIL ) 500 MG capsule, Take 4 capsules (2,000 mg total) by mouth as directed 1 hour prior to dental work including cleanings, Disp: 12 capsule, Rfl: 12   aspirin  EC 81 MG tablet, Take 81 mg by mouth every evening., Disp: , Rfl:    atorvastatin  (LIPITOR) 40 MG tablet, TAKE 1 TABLET BY MOUTH EVERY DAY, Disp: 90 tablet, Rfl: 3   carvedilol  (COREG ) 3.125 MG tablet, TAKE 1 TABLET(3.125 MG) BY MOUTH TWICE DAILY, Disp: 180 tablet, Rfl: 3  chlorhexidine  (PERIDEX ) 0.12 % solution, Use as directed 5 mLs in the mouth or throat 2 (two) times daily., Disp: , Rfl:    CINNAMON PO, Take 1 capsule by mouth at bedtime., Disp: , Rfl:    gabapentin  (NEURONTIN ) 100 MG capsule, Take 100 mg by mouth 2 (two) times daily., Disp: , Rfl:    gabapentin  (NEURONTIN ) 300 MG capsule, Take 300 mg by mouth at bedtime., Disp: , Rfl:    lisinopril  (ZESTRIL ) 40 MG tablet, Take 1 tablet (40 mg total) by mouth daily., Disp: 90 tablet, Rfl: 3   Multiple Vitamin (MULTIVITAMIN) capsule, Take 1 capsule by mouth daily. AM, Disp: , Rfl:    Omega-3 Fatty Acids (FISH OIL) 1000 MG CAPS, Take 1 capsule by mouth at bedtime., Disp: , Rfl:    omeprazole  (PRILOSEC  OTC) 20 MG tablet, Take 20 mg by mouth every evening., Disp: , Rfl:  [2]  Social History Tobacco Use  Smoking Status Every Day   Current packs/day: 1.50   Average packs/day: 1.5 packs/day for 54.5 years (81.8 ttl pk-yrs)   Types: Cigarettes   Start date: 03/27/1970  Smokeless Tobacco Never  Tobacco Comments   Smokes 1.5-2 packs per day

## 2024-10-05 NOTE — Progress Notes (Signed)
 Daily Session Note  Patient Details  Name: Chase [name] MRN: [id number] Date of Birth: 10-05-1956 Referring Provider:   Flowsheet Row Cardiac Rehab from 09/22/2024 in [facility] Cardiac [facility] Pul[facility] Re[facility]  Referring Provider Dr. [name]    Encounter Date: 10/05/2024  Check In:  Session Check In - 10/05/24 0909       Check-In   Supervising physician immediately available to respond to emergencies See telemetry face sheet for immediately available ER MD    Location [facility]-Cardiac & Pulmonary Rehab    Staff Present Burnard Davenport RN,BSN,MPA;[facility] RCP,RRT,BSRT;Laura Cates RN,BSN;Margaret Best, MS, Exercise Physiologist    Virtual Visit No    Medication changes reported     No    Fall or balance concerns reported    No    Warm-up and Cool-down Performed on first and last piece of equipment    Resistance Training Performed Yes    VAD Patient? No    PAD/SET Patient? No      Pain Assessment   Currently in Pain? No/denies             Tobacco Use History[1]  Goals Met:  Independence with exercise equipment Exercise tolerated well No report of concerns or symptoms today Strength training completed today  Goals Unmet:  Not Applicable  Comments: Pt able to follow exercise prescription today without complaint.  Will continue to monitor for progression.    Dr. Oneil Pinal is Medical Director for [facility] Cardiac Rehabilitation.  Dr. Fuad Aleskerov is Medical Director for [facility] Pulmonary Rehabilitation.    [1]  Social History Tobacco Use  Smoking Status Every Day   Current packs/day: 1.50   Average packs/day: 1.5 packs/day for 54.5 years (81.8 ttl pk-yrs)   Types: Cigarettes   Start date: 03/27/1970  Smokeless Tobacco Never  Tobacco Comments   Smokes 1.5-2 packs per day

## 2024-10-07 ENCOUNTER — Encounter

## 2024-10-10 ENCOUNTER — Encounter

## 2024-10-10 DIAGNOSIS — Z952 Presence of prosthetic heart valve: Secondary | ICD-10-CM

## 2024-10-10 NOTE — Progress Notes (Signed)
 Daily Session Note  Patient Details  Name: Chase [name] MRN: [id number] Date of Birth: Sep 14, 1956 Referring Provider:   Flowsheet Row Cardiac Rehab from 09/22/2024 in [facility] Cardiac and Pulmonary Rehab  Referring Provider Dr. [name]    Encounter Date: 10/10/2024  Check In:  Session Check In - 10/10/24 0942       Check-In   Supervising physician immediately available to respond to emergencies See telemetry face sheet for immediately available ER MD    Location [facility]-Cardiac & Pulmonary Rehab    Staff Present Burnard Davenport [facility] Peggi, RN, DNP, NE-BC;Laura Cates RN,BSN;Able Malloy Dyane BS, ACSM CEP, Exercise Physiologist;Kristen Coble RN,BC,MSN    Virtual Visit No    Medication changes reported     No    Fall or balance concerns reported    No    Warm-up and Cool-down Performed on first and last piece of equipment    Resistance Training Performed Yes    VAD Patient? No    PAD/SET Patient? No      Pain Assessment   Currently in Pain? No/denies             Tobacco Use History[1]  Goals Met:  Independence with exercise equipment Exercise tolerated well No report of concerns or symptoms today Strength training completed today  Goals Unmet:  Not Applicable  Comments: Pt able to follow exercise prescription today without complaint.  Will continue to monitor for progression.    Dr. Oneil Pinal is Medical Director for [facility] Cardiac Rehabilitation.  Dr. Fuad Aleskerov is Medical Director for [facility] Pulmonary Rehabilitation.    [1]  Social History Tobacco Use  Smoking Status Every Day   Current packs/day: 1.50   Average packs/day: 1.5 packs/day for 54.5 years (81.8 ttl pk-yrs)   Types: Cigarettes   Start date: 03/27/1970  Smokeless Tobacco Never  Tobacco Comments   Smokes 1.5-2 packs per day

## 2024-10-10 NOTE — Progress Notes (Unsigned)
 "  Cardiology Office Note    Date:  10/14/2024   ID:  Chase [name], DOB 10/01/1956, MRN [id number]  PCP:  [name], MD  Cardiologist:  [name], MD  Electrophysiologist:  None   Chief Complaint: Follow-up  History of Present Illness:   Chase [name] is a 68 y.o. 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  Prior ETT in 05/2015, for atypical chest pain, showed no significant EKG changes concerning for ischemia with a hypertensive response to exercise.  He has been monitored periodically with echoes for aortic stenosis with echo from 04/2021 demonstrating an EF of 60 to 65%, no regional wall motion abnormalities, grade 1 diastolic dysfunction, normal RV systolic function and ventricular cavity size, severe calcification of the aortic valve with moderate stenosis with a mean gradient of 24.5 mmHg and valve area of 1.17 cm, borderline dilatation of the aortic root and ascending aorta measuring 38 mm and 39 mm, respectively.  He underwent CTA of the chest/aorta, mildly dilated aortic root in 05/2019, which demonstrated the ascending aorta was 3.9 cm.  He was seen in the office in 09/2021 and was working on acquiring Medicare with plans to pursue TEE once this was completed.  He underwent MRI of the lumbar spine in 03/2022, which demonstrated an abdominal aorta measuring up to 3.3 cm, though was suboptimally evaluated.  AAA ultrasound from 06/05/2022 showed a stable 3.3 cm AAA when compared to study from 03/2018.  Echo from 05/2022 demonstrated an EF of 60 to 65%, no  regional wall motion abnormalities, moderate LVH, grade 2 diastolic dysfunction, normal RV systolic function and ventricular cavity size, mildly dilated left atrium, indeterminate number of cusps on the aortic valve with moderate calcification and moderate aortic valve stenosis with a mean gradient of 23 mmHg and a valve area by VTI of 1.13 cm, and normal size and structure aortic root.  Echo from 05/2023 showed an EF of 60 to 65%, no regional wall motion abnormalities, moderate LVH of the basal septal segment, normal LV diastolic function parameters, normal RV systolic function with mildly enlarged ventricular cavity size, moderate aortic stenosis with a mean gradient of 25 mmHg, mild dilatation of the aortic root measuring 44 mm and mild dilatation of the ascending aorta measuring 41 mm, and an estimated right atrial pressure of 3 mmHg.  Abdominal aortic ultrasound from 05/2023 showed the largest aortic measurement of 2.6 cm (prior 3.3 cm), with the supraceliac portion of the aorta not visualized due to overlying bowel gas.  CTA aorta in 01/2024 showed 4.1 cm ascending thoracic aorta.    Echo from 05/2024 showed an EF of 55 to 60%, no regional wall motion abnormalities, mild LVH, normal LV diastolic function parameters, normal RV systolic function and ventricular cavity size, calcified aortic valve with mild insufficiency and severe aortic stenosis with a mean gradient of 34 mmHg and a valve area of 0.88 cm.  AAA ultrasound from 05/2024 showed a stable AAA measuring 3.2 cm.  In the setting of progressive aortic stenosis he underwent R/LHC on 07/01/2024 that showed normal coronary arteries with a heavily calcified aortic valve  by fluoroscopy with severe stenosis by echo.  The valve was not crossed during cardiac cath.  RHC showed mildly elevated right atrial pressure, mild pulmonary hypertension, mildly elevated wedge pressure, and normal cardiac output.  In the setting of progressive aortic valve disease, he was  evaluated by multidisciplinary structural heart team and underwent successful TAVR on 09/06/2024 with postoperative echo showing preserved LV systolic function with normal functioning TAVR with a mean gradient of 8 mmHg and no perivalvular leak, as well as a mildly dilated ascending thoracic aorta measuring 40 mm.  Pre-TAVR workup notable for hypervascular lesions within the liver with follow-up imaging pending.  He comes in doing well from a cardiac perspective noticing an improvement in overall functional status following TAVR.  No chest pain or dyspnea.  He does note some intermittent split-second dizziness without near syncope or syncope.  No lower extremity swelling or progressive orthopnea.  Adherent to cardiac pharmacotherapy without off target effect.  Continues to work on tapering off tobacco use.  Has not yet scheduled MRI of the liver, though plans to do so.   Labs independently reviewed: 08/2024 - magnesium  1.7, potassium 4.2, BUN 16, serum creatinine 0.96, Hgb 13.5, PLT 141 albumin 3.7, AST/ALT normal 06/2024 - TC 134, TG 95, HDL 60, LDL 56 05/2019 - TSH normal  Past Medical History:  Diagnosis Date   Arthritis    maybe - hands   Chest pain    a. ETT 06/14/2015: no st segment or T-waves changes during stress, mildly reduced exercise capacity, hypertensive response to exercise   Chicken pox    Chronic venous insufficiency    a. improved with support hose.   Erectile dysfunction    Essential hypertension    CONTROLLED ON MEDS   History of echocardiogram    a. 03/2016 Echo: EF 55-60%, no rwma, mild AS, triv AI, mildly dil Ao.   HLD (hyperlipidemia)    Measles    Mumps    Polysubstance abuse (HCC)    a. 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 Daniel and Dr. Verlin   Wears dentures    full upper    Past Surgical History:  Procedure  Laterality Date   APPENDECTOMY  10/20/1970   COLONOSCOPY WITH PROPOFOL  N/A 08/27/2015   Procedure: COLONOSCOPY WITH PROPOFOL ;  Surgeon: Rogelia Copping, MD;  Location: [facility];  Service: Endoscopy;  Laterality: N/A;   INTRAOPERATIVE TRANSTHORACIC ECHOCARDIOGRAM N/A 09/06/2024   Procedure: ECHOCARDIOGRAM, TRANSTHORACIC;  Surgeon: Verlin Lonni BIRCH, MD;  Location: [facility];  Service: Cardiovascular;  Laterality: N/A;   POLYPECTOMY  08/27/2015   Procedure: POLYPECTOMY;  Surgeon: Rogelia Copping, MD;  Location: [facility];  Service: Endoscopy;;   RIGHT HEART CATH AND CORONARY ANGIOGRAPHY Bilateral 07/01/2024   Procedure: RIGHT HEART CATH AND CORONARY ANGIOGRAPHY;  Surgeon: Darron Deatrice LABOR, MD;  Location: [facility] INVASIVE CV [facility];  Service: Cardiovascular;  Laterality: Bilateral;   TRANSCATHETER AORTIC VALVE REPLACEMENT, TRANSFEMORAL  09/06/2024   Con Daniel, MD and Lonni Verlin, MD    Current Medications: Active Medications[1]  Allergies:   Patient has no known allergies.   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  Tobacco Use   Smoking status: Every Day    Current packs/day: 1.50    Average packs/day: 1.5 packs/day for 54.6 years (81.8 ttl pk-yrs)    Types: Cigarettes    Start date: 03/27/1970   Smokeless tobacco: Never   Tobacco comments:    Smokes 1.5-2 packs per day   Substance and Sexual Activity   Alcohol use: Yes    Alcohol/week: 41.0 standard drinks of alcohol    Types: 40 Cans of beer, 1 Shots of liquor per week    Comment: drinks 6-7 beers daily   Drug use: No   Sexual activity: Not on file  Other Topics Concern   Not on file  Social History Narrative   Not on file   Social Drivers of Health    Tobacco Use: High Risk (10/14/2024)   Patient History    Smoking Tobacco Use: Every Day    Smokeless Tobacco Use: Never    Passive Exposure: Not on file  Financial Resource Strain: Not on file  Food Insecurity: No Food Insecurity (09/06/2024)   Epic    Worried About Programme Researcher, Broadcasting/film/video in the Last Year: Never true    Ran Out of Food in the Last Year: Never true  Transportation Needs: No Transportation Needs (09/06/2024)   Epic    Lack of Transportation (Medical): No    Lack of Transportation (Non-Medical): No  Physical Activity: Not on file  Stress: Not on file  Social Connections: Socially Integrated (09/06/2024)   Social Connection and Isolation Panel    Frequency of Communication with Friends and Family: Three times a week    Frequency of Social Gatherings with Friends and Family: Three times a week    Attends Religious Services: More than 4 times per year    Active Member of Clubs or Organizations: Yes    Attends Banker Meetings: More than 4 times per year    Marital Status: Married  Depression (PHQ2-9): Low Risk (09/22/2024)   Depression (PHQ2-9)    PHQ-2 Score: 0  Alcohol Screen: Not on file  Housing: Low Risk (09/06/2024)   Epic    Unable to Pay for Housing in the Last Year: No    Number of Times Moved in the Last Year: 0    Homeless in the Last Year: No  Utilities: Not At Risk (09/06/2024)   Epic    Threatened with loss of utilities: No  Health Literacy: Not on file     Family History:  The patient's family history includes Heart disease in his mother; Pancreatic cancer in his father.  ROS:   12-point review of systems is negative unless otherwise noted in the HPI.   EKGs/Labs/Other Studies Reviewed:    Studies reviewed were summarized above. The additional studies were reviewed today:  R/LHC 07/01/2024: 1.  Normal coronary arteries. 2.  Heavily calcified aortic valve by fluoroscopy with severe stenosis by echo.  I did not attempt to cross  the valve. 3.  Right heart catheterization showed mildly elevated right atrial pressure, mild pulmonary hypertension, mildly elevated wedge pressure and normal cardiac output.   Recommendations: Proceed with aortic valve replacement evaluation.  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 __________   AAA ultrasound 06/17/2024: Summary:  Abdominal Aorta: There is evidence of abnormal dilatation of the proximal  Abdominal aorta. The largest aortic measurement is 3.2 cm. Previous  diameter measurement on 05/28/23 was 2.6  cm (supraceliac portion not  visualized due to bowel gas); previous  diameter measurement on 06/05/22 was 3.3cm.    Suggest follow up study in 12 months.  __________   2D echo 05/30/2024: 1. Left ventricular ejection fraction, by estimation, is 55 to 60%. The  left ventricle has normal function. The left ventricle has no regional  wall motion abnormalities. There is mild left ventricular hypertrophy.  Left ventricular diastolic parameters  were normal.   2. Right ventricular systolic function is normal. The right ventricular  size is normal.   3. The mitral valve is normal in structure. No evidence of mitral valve  regurgitation.   4. Aortic valve DVI 0.21. The aortic valve is calcified. Aortic valve  regurgitation is mild. Severe aortic valve stenosis. Aortic valve area, by  VTI measures 0.88 cm. Aortic valve mean gradient measures 34.0 mmHg.  Aortic valve Vmax measures 3.95 m/s.   5. The inferior vena cava is normal in size with greater than 50%  respiratory variability, suggesting right atrial pressure of 3 mmHg.  __________   Abdominal aortic ultrasound 05/26/2023: Summary:  Abdominal Aorta: The largest aortic measurement is 2.6 cm. The supraceliac  portion of the aorta was not seen due to overlying bowel gas. Previous  diameter measurement was 3.3 cm obtained on 06/05/22.  __________   2D echo 05/26/2023: 1. Left ventricular ejection fraction, by estimation,  is 60 to 65%. The  left ventricle has normal function. The left ventricle has no regional  wall motion abnormalities. There is moderate left ventricular hypertrophy  of the basal-septal segment. Left  ventricular diastolic parameters were normal.   2. Right ventricular systolic function is normal. The right ventricular  size is mildly enlarged.   3. The mitral valve is normal in structure. No evidence of mitral valve  regurgitation.   4. Mean AV gradient , PG , Vmax 3.40m/s. The aortic valve is  calcified. Aortic valve regurgitation is not visualized. Moderate aortic  valve stenosis.   5. Aortic dilatation noted. There is mild dilatation of the aortic root,  measuring 44 mm. There is mild dilatation of the ascending aorta,  measuring 41 mm.   6. The inferior vena cava is normal in size with greater than 50%  respiratory variability, suggesting right atrial pressure of 3 mmHg.  __________   2D echo 06/05/2022: 1. Left ventricular ejection fraction, by estimation, is 60 to 65%. The  left ventricle has normal function. The left ventricle has no regional  wall motion abnormalities. There is moderate left ventricular hypertrophy.  Left ventricular diastolic  parameters are consistent with Grade II diastolic dysfunction  (pseudonormalization). The average left ventricular global longitudinal  strain is -19.4 %.   2. Right ventricular systolic function is normal. The right ventricular  size is normal.   3. Left atrial size was mildly dilated.   4. The mitral valve is normal in structure. No evidence of mitral valve  regurgitation. No evidence of mitral stenosis.   5. The aortic valve has an indeterminant number of cusps. There is  moderate calcification of the aortic valve. Aortic valve regurgitation is  not visualized. Moderate aortic valve stenosis. Aortic valve area, by VTI  measures 1.13 cm. Aortic valve mean  gradient measures 23.0 mmHg. Aortic valve Vmax measures 3.56  m/s.   6. The inferior vena cava is normal in size with greater than 50%  respiratory variability, suggesting right atrial pressure of 3 mmHg.   Comparison(s): Previous AV PG's 48 mmHg max, 25  mmHg mean  __________   AAA ultrasound 06/05/2022: Summary:  Abdominal Aorta: There is evidence of abnormal dilatation of the proximal  Abdominal aorta. The largest aortic measurement is 3.3 cm. The largest  aortic diameter remains essentially unchanged compared to prior exam.  Previous diameter measurement was 3.3  cm obtained on 03/22/18.  __________   2D echo 04/30/2021: 1. Left ventricular ejection fraction, by estimation, is 60 to 65%. The  left ventricle has normal function. The left ventricle has no regional  wall motion abnormalities. Left ventricular diastolic parameters are  consistent with Grade I diastolic  dysfunction (impaired relaxation).   2. Right ventricular systolic function is normal. The right ventricular  size is normal.   3. The mitral valve is normal in structure. No evidence of mitral valve  regurgitation. No evidence of mitral stenosis.   4. The aortic valve is normal in structure. There is severe calcifcation  of the aortic valve. Aortic valve regurgitation is not visualized.  Moderate aortic valve stenosis. Aortic valve area, by VTI measures 1.17  cm. Aortic valve mean gradient measures  24.5 mmHg. Aortic valve Vmax measures 3.46 m/s.   5. There is borderline dilatation of the ascending aorta, measuring 39  mm. There is borderline dilatation of the aortic root, measuring 38 mm. __________   2D echo 05/13/2019: 1. The left ventricle has normal systolic function, with an ejection  fraction of 55-60%. The cavity size was normal. There is mildly increased  left ventricular wall thickness of the septal wall. Left ventricular  diastolic Doppler parameters are  consistent with pseudonormalization.   2. The right ventricle has normal systolic function. The cavity was   normal. There is no increase in right ventricular wall thickness. Unable  to estimate RVSP.   3. Moderate stenosis of the aortic valve. Mean gradient of 24 mm Hg, peak  gradient of 44 mm Hg, estimated AVA 1.3 cmsq   4. There is dilatation of the aortic root 4.5 cm and of the ascending  aorta 4.2 cm. __________   2D echo 04/09/2016: - Left ventricle: The cavity size was normal. Wall thickness was    increased in a pattern of mild LVH. Systolic function was normal.    The estimated ejection fraction was in the range of 55% to 60%.    Wall motion was normal; there were no regional wall motion    abnormalities.  - Aortic valve: Calcified annulus. Moderately thickened leaflets.    At the most, there was very mild stenosis. There was trivial    regurgitation. Valve area (VTI): 1.86 cm^2. Valve area (Vmax):    1.74 cm^2. Valve area (Vmean): 1.69 cm^2.  - Aorta: The aorta was mildly dilated. __________   ETT 06/14/2015: There was no ST segment deviation noted during stress. No T wave inversion was noted during stress.   Normal treadmill stress test. Mildly reduced exercise capacity with hypertensive response to exercise.   EKG:  EKG is not ordered today.    Recent Labs: 09/02/2024: ALT 26 09/07/2024: BUN 16; Creatinine, Ser 0.96; Hemoglobin 13.5; Magnesium  1.7; Platelets 141; Potassium 4.0; Sodium 140  Recent Lipid Panel    Component Value Date/Time   CHOL 134 06/23/2024 1150   TRIG 95 06/23/2024 1150   HDL 60 06/23/2024 1150   CHOLHDL 2.2 06/23/2024 1150   CHOLHDL 2.6 09/27/2021 0959   VLDL 14 09/27/2021 0959   LDLCALC 56 06/23/2024 1150    PHYSICAL EXAM:    VS:  BP 114/62  Pulse 73   Ht 6' 1 (1.854 m)   Wt 231 lb 6.4 oz (105 kg)   SpO2 96%   BMI 30.53 kg/m   BMI: Body mass index is 30.53 kg/m.  Physical Exam Vitals reviewed.  Constitutional:      Appearance: He is well-developed.  HENT:     Head: Normocephalic and atraumatic.  Eyes:     General:         Right eye: No discharge.        Left eye: No discharge.  Cardiovascular:     Rate and Rhythm: Normal rate and regular rhythm.     Heart sounds: Normal heart sounds, S1 normal and S2 normal. Heart sounds not distant. No midsystolic click and no opening snap. No murmur heard.    No friction rub.  Pulmonary:     Effort: Pulmonary effort is normal. No respiratory distress.     Breath sounds: Normal breath sounds. No decreased breath sounds, wheezing, rhonchi or rales.  Musculoskeletal:     Cervical back: Normal range of motion.  Skin:    General: Skin is warm and dry.     Nails: There is no clubbing.  Neurological:     Mental Status: He is alert and oriented to person, place, and time.  Psychiatric:        Speech: Speech normal.        Behavior: Behavior normal.        Thought Content: Thought content normal.        Judgment: Judgment normal.     Wt Readings from Last 3 Encounters:  10/14/24 231 lb 6.4 oz (105 kg)  09/22/24 230 lb 1.6 oz (104.4 kg)  09/12/24 228 lb 3.2 oz (103.5 kg)     ASSESSMENT & PLAN:   Severe aortic stenosis: Status post successful TAVR on 09/06/2024 with postprocedure echo in 09/2024 showing normal functioning TAVR prosthesis.  ASA 81 mg.  Lifelong SBE prophylaxis for all dental procedures.  Repeat echo in 08/2025.  Coronary artery calcification: Noted on prior CT. LHC in 06/2024 showed normal coronary arteries.  No symptoms suggestive of angina.  Aggressive risk factor modification and primary prevention including aspirin  81 mg and atorvastatin  40 mg.  No indication for further ischemic testing at this time.  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  Echo in 09/2024 with stable mild dilatation of the ascending thoracic aorta measuring 40 mm.  Anticipate CTA aorta in 01/2025. Optimal blood pressure control  recommended.   AAA: Stable by ultrasound in 05/2024, measuring 3.2 cm.  Repeat imaging in 05/2025. Optimal blood pressure control and smoking cessation recommended.   HTN: Blood pressure is well-controlled in the office.  He remains on amlodipine  10 mg, carvedilol  3.125 mg twice daily and lisinopril  40 mg.  HLD: LDL 56 in 06/2024 with normal AST/ALT in 08/2024.  Remains on atorvastatin  40 mg.  Tobacco use: Working on tapering.  Complete cessation recommended.  Hypervascular liver lesion: Discussed with patient.  He will schedule follow-up MRI of the liver.     Disposition: F/u with Dr. Darron or an APP in 6 months.   Medication Adjustments/Labs and Tests Ordered: Current medicines are reviewed at length with the patient today.  Concerns regarding medicines are outlined above. Medication changes, Labs and Tests ordered today are summarized above and listed in the Patient Instructions accessible in Encounters.  SignedBernardino Bring, PA-C 10/14/2024 10:45 AM     [facility] [place] [address] [place], [place] [address] [phone]     [1]  Current Meds  Medication Sig   amLODipine  (NORVASC ) 10 MG tablet Take 1 tablet (10 mg total) by mouth daily.   amoxicillin  (AMOXIL ) 500 MG capsule Take 4 capsules (2,000 mg total) by mouth as directed 1 hour prior to dental work including cleanings   aspirin  EC 81 MG tablet Take 81 mg by mouth every evening.   atorvastatin  (LIPITOR) 40 MG tablet TAKE 1 TABLET BY MOUTH EVERY DAY   carvedilol  (COREG ) 3.125 MG tablet TAKE 1 TABLET(3.125 MG) BY MOUTH TWICE DAILY   chlorhexidine  (PERIDEX ) 0.12 % solution Use as directed 5 mLs in the mouth or throat 2 (two) times daily.   CINNAMON PO Take 1 capsule by mouth at bedtime.   gabapentin  (NEURONTIN ) 100 MG capsule Take 100 mg by mouth 2 (two) times daily.   gabapentin  (NEURONTIN ) 300 MG capsule Take 300 mg by mouth at bedtime.   lisinopril  (ZESTRIL ) 40 MG tablet Take 1 tablet  (40 mg total) by mouth daily.   Multiple Vitamin (MULTIVITAMIN) capsule Take 1 capsule by mouth daily. AM   Omega-3 Fatty Acids (FISH OIL) 1000 MG CAPS Take 1 capsule by mouth at bedtime.   omeprazole  (PRILOSEC  OTC) 20 MG tablet Take 20 mg by mouth every evening.   "

## 2024-10-12 ENCOUNTER — Encounter

## 2024-10-14 ENCOUNTER — Ambulatory Visit: Attending: Physician Assistant | Admitting: Physician Assistant

## 2024-10-14 ENCOUNTER — Encounter: Payer: Self-pay | Admitting: Physician Assistant

## 2024-10-14 VITALS — BP 114/62 | HR 73 | Ht 73.0 in | Wt 231.4 lb

## 2024-10-14 DIAGNOSIS — I714 Abdominal aortic aneurysm, without rupture, unspecified: Secondary | ICD-10-CM

## 2024-10-14 DIAGNOSIS — K769 Liver disease, unspecified: Secondary | ICD-10-CM

## 2024-10-14 DIAGNOSIS — I350 Nonrheumatic aortic (valve) stenosis: Secondary | ICD-10-CM | POA: Diagnosis not present

## 2024-10-14 DIAGNOSIS — Z952 Presence of prosthetic heart valve: Secondary | ICD-10-CM

## 2024-10-14 DIAGNOSIS — E785 Hyperlipidemia, unspecified: Secondary | ICD-10-CM

## 2024-10-14 DIAGNOSIS — Z720 Tobacco use: Secondary | ICD-10-CM | POA: Diagnosis not present

## 2024-10-14 DIAGNOSIS — I2510 Atherosclerotic heart disease of native coronary artery without angina pectoris: Secondary | ICD-10-CM

## 2024-10-14 DIAGNOSIS — I10 Essential (primary) hypertension: Secondary | ICD-10-CM | POA: Diagnosis not present

## 2024-10-14 DIAGNOSIS — I77810 Thoracic aortic ectasia: Secondary | ICD-10-CM

## 2024-10-14 NOTE — Patient Instructions (Signed)
"   Medication Instructions:  Your physician recommends that you continue on your current medications as directed. Please refer to the Current Medication list given to you today.  *If you need a refill on your cardiac medications before your next appointment, please call your pharmacy*  Lab Work: No labs ordered today    Testing/Procedures: No test ordered today   Follow-Up: At [facility], you and your health needs are our priority.  As part of our continuing mission to provide you with exceptional heart care, our providers are all part of one team.  This team includes your primary Cardiologist (physician) and Advanced Practice Providers or APPs (Physician Assistants and Nurse Practitioners) who all work together to provide you with the care you need, when you need it.  Your next appointment:   6 month(s)  Provider:   Bernardino Bring, PA-C       "

## 2024-10-17 ENCOUNTER — Encounter

## 2024-10-17 DIAGNOSIS — Z952 Presence of prosthetic heart valve: Secondary | ICD-10-CM

## 2024-10-17 NOTE — Progress Notes (Signed)
 Daily Session Note  Patient Details  Name: Chase [name] MRN: [id number] Date of Birth: 1956/04/16 Referring Provider:   Flowsheet Row Cardiac Rehab from 09/22/2024 in [facility] Cardiac and Pulmonary Rehab  Referring Provider Dr. [name]    Encounter Date: 10/17/2024  Check In:  Session Check In - 10/17/24 0942       Check-In   Supervising physician immediately available to respond to emergencies See telemetry face sheet for immediately available ER MD    Location [facility]-Cardiac & Pulmonary Rehab    Staff Present Burnard Davenport RN,BSN,MPA;Joseph Hood RCP,RRT,BSRT;Maxon Burnell BS, Exercise Physiologist;Laureen Delores, BS, RRT, CPFT    Virtual Visit No    Medication changes reported     No    Fall or balance concerns reported    No    Warm-up and Cool-down Performed on first and last piece of equipment    Resistance Training Performed Yes    VAD Patient? No    PAD/SET Patient? No      Pain Assessment   Currently in Pain? No/denies             Tobacco Use History[1]  Goals Met:  Independence with exercise equipment Exercise tolerated well No report of concerns or symptoms today Strength training completed today  Goals Unmet:  Not Applicable  Comments: Pt able to follow exercise prescription today without complaint.  Will continue to monitor for progression.    Dr. Oneil Pinal is Medical Director for [facility] Cardiac Rehabilitation.  Dr. Fuad Aleskerov is Medical Director for [facility] Pulmonary Rehabilitation.    [1]  Social History Tobacco Use  Smoking Status Every Day   Current packs/day: 1.50   Average packs/day: 1.5 packs/day for 54.6 years (81.8 ttl pk-yrs)   Types: Cigarettes   Start date: 03/27/1970  Smokeless Tobacco Never  Tobacco Comments   Smokes 1.5-2 packs per day

## 2024-10-18 NOTE — Progress Notes (Signed)
NYHA: 1

## 2024-10-19 ENCOUNTER — Encounter

## 2024-10-19 ENCOUNTER — Telehealth: Payer: Self-pay | Admitting: Physician Assistant

## 2024-10-19 DIAGNOSIS — Z952 Presence of prosthetic heart valve: Secondary | ICD-10-CM

## 2024-10-19 NOTE — Telephone Encounter (Signed)
 I see no obvious cardiac contraindication to COVID-19 vaccination.  However, I will forward this to the patient's primary cardiologist, Dr. Darron, for his thoughts and final recommendations.  Lonni Hanson, MD [facility]

## 2024-10-19 NOTE — Telephone Encounter (Signed)
 Pt just had tavers and was wondering if it is ok for him to have covid shot

## 2024-10-19 NOTE — Telephone Encounter (Signed)
 Called patient to advise that there is no cardiac contraindication to covid vaccine

## 2024-10-19 NOTE — Progress Notes (Signed)
 Daily Session Note  Patient Details  Name: Chase [name] MRN: [id number] Date of Birth: 21-Apr-1956 Referring Provider:   Flowsheet Row Cardiac Rehab from 09/22/2024 in [facility] Cardiac and Pulmonary Rehab  Referring Provider Dr. [name]    Encounter Date: 10/19/2024  Check In:  Session Check In - 10/19/24 0907       Check-In   Supervising physician immediately available to respond to emergencies See telemetry face sheet for immediately available ER MD    Location [facility]-Cardiac & Pulmonary Rehab    Staff Present Burnard Davenport RN,BSN,MPA;Joseph [facility] RCP,RRT,BSRT;Laura Cates RN,BSN;Margaret Best, MS, Exercise Physiologist;Noah Tickle, BS, Exercise Physiologist    Virtual Visit No    Medication changes reported     No    Fall or balance concerns reported    No    Warm-up and Cool-down Performed on first and last piece of equipment    Resistance Training Performed Yes    VAD Patient? No    PAD/SET Patient? No      Pain Assessment   Currently in Pain? No/denies             Tobacco Use History[1]  Goals Met:  Independence with exercise equipment Exercise tolerated well No report of concerns or symptoms today Strength training completed today  Goals Unmet:  Not Applicable  Comments: Pt able to follow exercise prescription today without complaint.  Will continue to monitor for progression.    Dr. Oneil Pinal is Medical Director for [facility] Cardiac Rehabilitation.  Dr. Fuad Aleskerov is Medical Director for [facility] Pulmonary Rehabilitation.    [1]  Social History Tobacco Use  Smoking Status Every Day   Current packs/day: 1.50   Average packs/day: 1.5 packs/day for 54.6 years (81.8 ttl pk-yrs)   Types: Cigarettes   Start date: 03/27/1970  Smokeless Tobacco Never  Tobacco Comments   Smokes 1.5-2 packs per day

## 2024-10-21 ENCOUNTER — Encounter: Attending: Cardiovascular Disease | Admitting: Emergency Medicine

## 2024-10-21 DIAGNOSIS — Z952 Presence of prosthetic heart valve: Secondary | ICD-10-CM | POA: Insufficient documentation

## 2024-10-21 DIAGNOSIS — Z48812 Encounter for surgical aftercare following surgery on the circulatory system: Secondary | ICD-10-CM | POA: Insufficient documentation

## 2024-10-21 NOTE — Progress Notes (Signed)
 Daily Session Note  Patient Details  Name: Chase [name] MRN: [id number] Date of Birth: [birth year] Referring Provider:   Flowsheet Row Cardiac Rehab from 09/22/2024 in [facility] Cardiac and Pulmonary Rehab  Referring Provider Dr. [name]    Encounter Date: 10/21/2024  Check In:  Session Check In - 10/21/24 0939       Check-In   Supervising physician immediately available to respond to emergencies See telemetry face sheet for immediately available ER MD    Location [facility]-Cardiac & Pulmonary Rehab    Staff Present Leita Franks RN,BSN;Joseph [facility] BS, Exercise Physiologist;Laureen Delores, [place], RRT, CPFT    Virtual Visit No    Medication changes reported     No    Fall or balance concerns reported    No    Warm-up and Cool-down Performed on first and last piece of equipment    Resistance Training Performed Yes    VAD Patient? No    PAD/SET Patient? No      Pain Assessment   Currently in Pain? No/denies             Tobacco Use History[1]  Goals Met:  Independence with exercise equipment Exercise tolerated well No report of concerns or symptoms today Strength training completed today  Goals Unmet:  Not Applicable  Comments: Pt able to follow exercise prescription today without complaint.  Will continue to monitor for progression.    Dr. Oneil Pinal is Medical Director for [facility] Cardiac Rehabilitation.  Dr. Fuad Aleskerov is Medical Director for [facility] Pulmonary Rehabilitation.    [1]  Social History Tobacco Use  Smoking Status Every Day   Current packs/day: 1.50   Average packs/day: 1.5 packs/day for 54.6 years (81.9 ttl pk-yrs)   Types: Cigarettes   Start date: 03/27/1970  Smokeless Tobacco Never  Tobacco Comments   Smokes 1.5-2 packs per day

## 2024-10-24 ENCOUNTER — Encounter: Admitting: *Deleted

## 2024-10-24 DIAGNOSIS — Z952 Presence of prosthetic heart valve: Secondary | ICD-10-CM

## 2024-10-24 DIAGNOSIS — Z48812 Encounter for surgical aftercare following surgery on the circulatory system: Secondary | ICD-10-CM | POA: Diagnosis not present

## 2024-10-24 NOTE — Progress Notes (Signed)
 Daily Session Note  Patient Details  Name: Chase [name] MRN: [id number] Date of Birth: [birth year] Referring Provider:   Flowsheet Row Cardiac Rehab from 09/22/2024 in [facility] Cardiac and Pulmonary Rehab  Referring Provider Dr. [name]    Encounter Date: 10/24/2024  Check In:  Session Check In - 10/24/24 0931       Check-In   Supervising physician immediately available to respond to emergencies See telemetry face sheet for immediately available ER MD    Location [facility]-Cardiac & Pulmonary Rehab    Staff Present Othel Durand, RN, BSN, CCRP;Joseph Hood RCP,RRT,BSRT;Kelly Loyall BS, ACSM CEP, Exercise Physiologist;Maxon Conetta BS, Exercise Physiologist    Virtual Visit No    Medication changes reported     No    Fall or balance concerns reported    No    Warm-up and Cool-down Performed on first and last piece of equipment    Resistance Training Performed Yes    VAD Patient? No    PAD/SET Patient? No      Pain Assessment   Currently in Pain? No/denies             Tobacco Use History[1]  Goals Met:  Independence with exercise equipment Exercise tolerated well No report of concerns or symptoms today  Goals Unmet:  Not Applicable  Comments: Pt able to follow exercise prescription today without complaint.  Will continue to monitor for progression.    Dr. Oneil Pinal is Medical Director for [facility] Cardiac Rehabilitation.  Dr. Fuad Aleskerov is Medical Director for [facility] Pulmonary Rehabilitation.    [1]  Social History Tobacco Use  Smoking Status Every Day   Current packs/day: 1.50   Average packs/day: 1.5 packs/day for 54.6 years (81.9 ttl pk-yrs)   Types: Cigarettes   Start date: 03/27/1970  Smokeless Tobacco Never  Tobacco Comments   Smokes 1.5-2 packs per day

## 2024-10-26 ENCOUNTER — Encounter: Admitting: Emergency Medicine

## 2024-10-26 DIAGNOSIS — Z48812 Encounter for surgical aftercare following surgery on the circulatory system: Secondary | ICD-10-CM | POA: Diagnosis not present

## 2024-10-26 DIAGNOSIS — Z952 Presence of prosthetic heart valve: Secondary | ICD-10-CM

## 2024-10-26 NOTE — Progress Notes (Signed)
 Daily Session Note  Patient Details  Name: Chase [name] MRN: [id number] Date of Birth: January 27, 1956 Referring Provider:   Flowsheet Row Cardiac Rehab from 09/22/2024 in [facility] Cardiac and Pulmonary Rehab  Referring Provider Dr. [name]    Encounter Date: 10/26/2024  Check In:  Session Check In - 10/26/24 0918       Check-In   Supervising physician immediately available to respond to emergencies See telemetry face sheet for immediately available ER MD    Location [facility]-Cardiac & Pulmonary Rehab    Staff Present Leita Franks RN,BSN;Joseph [facility] BS, Exercise Physiologist;Margaret Best, MS, Exercise Physiologist    Virtual Visit No    Medication changes reported     No    Fall or balance concerns reported    No    Warm-up and Cool-down Performed on first and last piece of equipment    Resistance Training Performed Yes    VAD Patient? No    PAD/SET Patient? No      Pain Assessment   Currently in Pain? No/denies             Tobacco Use History[1]  Goals Met:  Independence with exercise equipment Exercise tolerated well No report of concerns or symptoms today Strength training completed today  Goals Unmet:  Not Applicable  Comments: Pt able to follow exercise prescription today without complaint.  Will continue to monitor for progression.    Dr. Oneil Pinal is Medical Director for [facility] Cardiac Rehabilitation.  Dr. Fuad Aleskerov is Medical Director for [facility] Pulmonary Rehabilitation.    [1]  Social History Tobacco Use  Smoking Status Every Day   Current packs/day: 1.50   Average packs/day: 1.5 packs/day for 54.6 years (81.9 ttl pk-yrs)   Types: Cigarettes   Start date: 03/27/1970  Smokeless Tobacco Never  Tobacco Comments   Smokes 1.5-2 packs per day

## 2024-10-28 ENCOUNTER — Encounter: Admitting: Emergency Medicine

## 2024-10-28 DIAGNOSIS — Z48812 Encounter for surgical aftercare following surgery on the circulatory system: Secondary | ICD-10-CM | POA: Diagnosis not present

## 2024-10-28 DIAGNOSIS — Z952 Presence of prosthetic heart valve: Secondary | ICD-10-CM

## 2024-10-28 NOTE — Progress Notes (Signed)
 Daily Session Note  Patient Details  Name: Chase [name] MRN: [id number] Date of Birth: 1956-10-06 Referring Provider:   Flowsheet Row Cardiac Rehab from 09/22/2024 in [facility] Cardiac and Pulmonary Rehab  Referring Provider Dr. [name]    Encounter Date: 10/28/2024  Check In:  Session Check In - 10/28/24 0908       Check-In   Supervising physician immediately available to respond to emergencies See telemetry face sheet for immediately available ER MD    Location [facility]-Cardiac & Pulmonary Rehab    Staff Present Leita Franks RN,BSN;Joseph [facility] BS, Exercise Physiologist;Noah Tickle, BS, Exercise Physiologist    Virtual Visit No    Medication changes reported     No    Fall or balance concerns reported    No    Warm-up and Cool-down Performed on first and last piece of equipment    Resistance Training Performed Yes    VAD Patient? No    PAD/SET Patient? No      Pain Assessment   Currently in Pain? No/denies             Tobacco Use History[1]  Goals Met:  Independence with exercise equipment Exercise tolerated well No report of concerns or symptoms today Strength training completed today  Goals Unmet:  Not Applicable  Comments: Pt able to follow exercise prescription today without complaint.  Will continue to monitor for progression.    Dr. Oneil Pinal is Medical Director for [facility] Cardiac Rehabilitation.  Dr. Fuad Aleskerov is Medical Director for [facility] Pulmonary Rehabilitation.    [1]  Social History Tobacco Use  Smoking Status Every Day   Current packs/day: 1.50   Average packs/day: 1.5 packs/day for 54.6 years (81.9 ttl pk-yrs)   Types: Cigarettes   Start date: 03/27/1970  Smokeless Tobacco Never  Tobacco Comments   Smokes 1.5-2 packs per day

## 2024-10-31 ENCOUNTER — Encounter

## 2024-10-31 DIAGNOSIS — Z952 Presence of prosthetic heart valve: Secondary | ICD-10-CM

## 2024-10-31 DIAGNOSIS — Z48812 Encounter for surgical aftercare following surgery on the circulatory system: Secondary | ICD-10-CM | POA: Diagnosis not present

## 2024-10-31 NOTE — Progress Notes (Signed)
 Daily Session Note  Patient Details  Name: Chase [name] MRN: [id number] Date of Birth: [birth year] Referring Provider:   Flowsheet Row Cardiac Rehab from 09/22/2024 in [facility] Cardiac and Pulmonary Rehab  Referring Provider Dr. [name]    Encounter Date: 10/31/2024  Check In:  Session Check In - 10/31/24 0934       Check-In   Supervising physician immediately available to respond to emergencies See telemetry face sheet for immediately available ER MD    Location [facility]-Cardiac & Pulmonary Rehab    Staff Present Burnard Davenport RN,BSN,MPA;Joseph Hood RCP,RRT,BSRT;Maxon Burnell BS, Exercise Physiologist;Ahriyah Vannest Dyane HECKLE, ACSM CEP, Exercise Physiologist    Virtual Visit No    Medication changes reported     No    Fall or balance concerns reported    No    Warm-up and Cool-down Performed on first and last piece of equipment    Resistance Training Performed Yes    VAD Patient? No    PAD/SET Patient? No      Pain Assessment   Currently in Pain? No/denies             Tobacco Use History[1]  Goals Met:  Independence with exercise equipment Exercise tolerated well No report of concerns or symptoms today Strength training completed today  Goals Unmet:  Not Applicable  Comments: Pt able to follow exercise prescription today without complaint.  Will continue to monitor for progression.    Dr. Oneil Pinal is Medical Director for [facility] Cardiac Rehabilitation.  Dr. Fuad Aleskerov is Medical Director for [facility] Pulmonary Rehabilitation.    [1]  Social History Tobacco Use  Smoking Status Every Day   Current packs/day: 1.50   Average packs/day: 1.5 packs/day for 54.6 years (81.9 ttl pk-yrs)   Types: Cigarettes   Start date: 03/27/1970  Smokeless Tobacco Never  Tobacco Comments   Smokes 1.5-2 packs per day

## 2024-11-01 ENCOUNTER — Inpatient Hospital Stay
Admission: RE | Admit: 2024-11-01 | Discharge: 2024-11-01 | Disposition: A | Source: Ambulatory Visit | Attending: Physician Assistant

## 2024-11-01 DIAGNOSIS — K769 Liver disease, unspecified: Secondary | ICD-10-CM

## 2024-11-01 MED ORDER — GADOPICLENOL 0.5 MMOL/ML IV SOLN
10.0000 mL | Freq: Once | INTRAVENOUS | Status: AC | PRN
  Administered 2024-11-01: 10 mL via INTRAVENOUS

## 2024-11-02 ENCOUNTER — Encounter

## 2024-11-02 ENCOUNTER — Encounter: Payer: Self-pay | Admitting: *Deleted

## 2024-11-02 ENCOUNTER — Ambulatory Visit: Payer: Self-pay | Admitting: Physician Assistant

## 2024-11-02 DIAGNOSIS — Z952 Presence of prosthetic heart valve: Secondary | ICD-10-CM

## 2024-11-02 NOTE — Progress Notes (Signed)
 Cardiac Individual Treatment Plan  Patient Details  Name: Chase [name] MRN: [id number] Date of Birth: [birth year] Referring Provider:   Flowsheet Row Cardiac Rehab from 09/22/2024 in [facility] Cardiac and Pulmonary Rehab  Referring Provider Dr. [name]    Initial Encounter Date:  Flowsheet Row Cardiac Rehab from 09/22/2024 in [facility] Cardiac and Pulmonary Rehab  Date 09/22/24    Visit Diagnosis: S/P TAVR (transcatheter aortic valve replacement)  Patient's Home Medications on Admission: Current Medications[1]  Past Medical History: Past Medical History:  Diagnosis Date   Arthritis    maybe - hands   Chest pain    a. ETT 06/14/2015: no st segment or T-waves changes during stress, mildly reduced exercise capacity, hypertensive response to exercise   Chicken pox    Chronic venous insufficiency    a. improved with support hose.   Erectile dysfunction    Essential hypertension    CONTROLLED ON MEDS   History of echocardiogram    a. 03/2016 Echo: EF 55-60%, no rwma, mild AS, triv AI, mildly dil Ao.   HLD (hyperlipidemia)    Measles    Mumps    Polysubstance abuse (HCC)    a. 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 Daniel and Dr. Verlin   Wears dentures    full upper    Tobacco Use: Tobacco Use History[2]  Labs: Review Flowsheet  More data exists      Latest Ref Rng & Units 09/27/2021 06/29/2023 06/23/2024 07/01/2024 09/06/2024  Labs for ITP Cardiac and Pulmonary Rehab  Cholestrol 100 - 199 mg/dL [phone]  [phone]  - -  LDL (calc) 0 - 99 mg/dL 78  60  56  - -  HDL-C >39 mg/dL 56  51  60  - -  Trlycerides 0 - 149 mg/dL 69  83  95  - -  PH, Arterial 7.35 - 7.45 - - - 7.380  -  PCO2 arterial 32 - 48 mmHg - - - 43.0  -  Bicarbonate 20.0 - 28.0 mmol/L - - - 27.4  25.4  -  TCO2 22 - 32 mmol/L - - - 29  27  22  23    O2 Saturation % - - - 68  96   -    Details       Multiple values from one day are sorted in reverse-chronological order          Exercise Target Goals: Exercise Program Goal: Individual exercise prescription set using results from initial 6 min walk test and THRR while considering  patients activity barriers and safety.   Exercise Prescription Goal: Initial exercise prescription builds to 30-45 minutes a day of aerobic activity, 2-3 days per week.  Home exercise guidelines will be given to patient during program as part of exercise prescription that the participant will acknowledge.   Education: Aerobic Exercise: - Group verbal and visual presentation on the components of exercise prescription. Introduces F.I.T.T principle from ACSM for exercise prescriptions.  Reviews F.I.T.T. principles of aerobic exercise including progression. Written material provided at class time.   Education: Resistance Exercise: - Group verbal and visual presentation on the components of exercise prescription. Introduces F.I.T.T principle from ACSM for exercise prescriptions  Reviews F.I.T.T. principles of resistance exercise including progression. Written material provided at class time.    Education: Exercise & Equipment Safety: -  Individual verbal instruction and demonstration of equipment use and safety with use of the equipment. Flowsheet Row Cardiac Rehab from 10/05/2024 in [facility] Cardiac and Pulmonary Rehab  Date 09/22/24  Educator [facility]  Instruction Review Code 1- Verbalizes Understanding    Education: Exercise Physiology & General Exercise Guidelines: - Group verbal and written instruction with models to review the exercise physiology of the cardiovascular system and associated critical values. Provides general exercise guidelines with specific guidelines to those with heart or lung disease. Written material provided at class time.   Education: Flexibility, Balance, Mind/Body Relaxation: - Group verbal and visual  presentation with interactive activity on the components of exercise prescription. Introduces F.I.T.T principle from ACSM for exercise prescriptions. Reviews F.I.T.T. principles of flexibility and balance exercise training including progression. Also discusses the mind body connection.  Reviews various relaxation techniques to help reduce and manage stress (i.e. Deep breathing, progressive muscle relaxation, and visualization). Balance handout provided to take home. Written material provided at class time. Flowsheet Row Cardiac Rehab from 10/05/2024 in [facility] Cardiac and Pulmonary Rehab  Date 10/05/24  Educator nt  Instruction Review Code 1- Verbalizes Understanding    Activity Barriers & Risk Stratification:  Activity Barriers & Cardiac Risk Stratification - 09/22/24 1613       Activity Barriers & Cardiac Risk Stratification   Activity Barriers None    Cardiac Risk Stratification Moderate          6 Minute Walk:  6 Minute Walk     Row Name 09/22/24 1615         6 Minute Walk   Phase Initial     Distance 1435 feet     Walk Time 6 minutes     # of Rest Breaks 0     MPH 2.7     METS 3.12     RPE 9     Perceived Dyspnea  1     VO2 Peak 10.9     Symptoms Yes (comment)     Comments tightness in chest     Resting HR 78 bpm     Resting BP 124/64     Resting Oxygen Saturation  97 %     Exercise Oxygen Saturation  during 6 min walk 97 %     Max Ex. HR 88 bpm     Max Ex. BP 138/62     2 Minute Post BP 122/64        Oxygen Initial Assessment:   Oxygen Re-Evaluation:   Oxygen Discharge (Final Oxygen Re-Evaluation):   Initial Exercise Prescription:  Initial Exercise Prescription - 09/22/24 1600       Date of Initial Exercise RX and Referring Provider   Date 09/22/24    Referring Provider Dr. [name]      Oxygen   Maintain Oxygen Saturation 88% or higher      Treadmill   MPH 2.7    Grade 0    Minutes 15    METs 3.07      NuStep   Level 3    SPM 80     Minutes 15    METs 3.12      Rower   Level 3    Watts 30    Minutes 15    METs 3.12      Prescription Details   Duration Progress to 30 minutes of continuous aerobic without signs/symptoms of physical distress      Intensity   THRR 40-80% of Max Heartrate 108-138  Ratings of Perceived Exertion 11-13    Perceived Dyspnea 0-4      Progression   Progression Continue to progress workloads to maintain intensity without signs/symptoms of physical distress.      Resistance Training   Training Prescription Yes    Weight 5lb    Reps 10-15          Perform Capillary Blood Glucose checks as needed.  Exercise Prescription Changes:   Exercise Prescription Changes     Row Name 09/22/24 1600 10/03/24 1100 10/25/24 1400 11/01/24 1300       Response to Exercise   Blood Pressure (Admit) 124/64 112/60 114/60 132/70    Blood Pressure (Exercise) 138/62 138/70 130/68 134/64    Blood Pressure (Exit) 122/64 112/58 102/54 118/60    Heart Rate (Admit) 78 bpm 71 bpm 69 bpm 70 bpm    Heart Rate (Exercise) 88 bpm 121 bpm 114 bpm 123 bpm    Heart Rate (Exit) 80 bpm 83 bpm 81 bpm 81 bpm    Oxygen Saturation (Admit) 97 % -- -- --    Oxygen Saturation (Exercise) 97 % -- -- --    Oxygen Saturation (Exit) 97 % -- -- --    Rating of Perceived Exertion (Exercise) 9 15 13 15     Perceived Dyspnea (Exercise) 1 0 -- --    Symptoms chest tightness none none none    Comments results first 2 weeks of exercise -- --    Duration -- Progress to 30 minutes of  aerobic without signs/symptoms of physical distress Continue with 30 min of aerobic exercise without signs/symptoms of physical distress. Continue with 30 min of aerobic exercise without signs/symptoms of physical distress.    Intensity -- THRR unchanged THRR unchanged THRR unchanged      Progression   Progression -- Continue to progress workloads to maintain intensity without signs/symptoms of physical distress. Continue to progress  workloads to maintain intensity without signs/symptoms of physical distress. Continue to progress workloads to maintain intensity without signs/symptoms of physical distress.    Average METs -- 3.4 3.31 3.4      Resistance Training   Weight -- 5lb 5 lb 5 lb    Reps -- 10-15 10-15 10-15      Interval Training   Interval Training -- No No No      Treadmill   MPH -- 2.8 2.8 2.7    Grade -- 1 0 2    Minutes -- 15 15 15     METs -- 3.53 3.14 3.81      Bike   Level -- -- 1 6    Watts -- -- 71 43    Minutes -- -- 15 15    METs -- -- 4.14 3.27      NuStep   Level -- 2  T6 2  T6: 1 3  T6    Minutes -- 15 15 15     METs -- 2.7 3.2  T6: 3.1 3.3      REL-XR   Level -- 1 -- 2    Minutes -- 15 -- 15    METs -- 3.5 -- 4.2      Biostep-RELP   Level -- -- -- 3    Minutes -- -- -- 15    METs -- -- -- 3      Rower   Level -- -- -- 10    Watts -- -- -- 17    Minutes -- -- -- 15  METs -- -- -- 4.12      Oxygen   Maintain Oxygen Saturation -- 88% or higher 88% or higher 88% or higher       Exercise Comments:   Exercise Comments     Row Name 09/26/24 0848           Exercise Comments First full day of exercise!  Patient was oriented to gym and equipment including functions, settings, policies, and procedures.  Patient's individual exercise prescription and treatment plan were reviewed.  All starting workloads were established based on the results of the 6 minute walk test done at initial orientation visit.  The plan for exercise progression was also introduced and progression will be customized based on patient's performance and goals.          Exercise Goals and Review:   Exercise Goals     Row Name 09/22/24 1634             Exercise Goals   Increase Physical Activity Yes       Intervention Provide advice, education, support and counseling about physical activity/exercise needs.;Develop an individualized exercise prescription for aerobic and resistive training based  on initial evaluation findings, risk stratification, comorbidities and participant's personal goals.       Expected Outcomes Short Term: Attend rehab on a regular basis to increase amount of physical activity.;Long Term: Add in home exercise to make exercise part of routine and to increase amount of physical activity.;Long Term: Exercising regularly at least 3-5 days a week.       Increase Strength and Stamina Yes       Intervention Provide advice, education, support and counseling about physical activity/exercise needs.;Develop an individualized exercise prescription for aerobic and resistive training based on initial evaluation findings, risk stratification, comorbidities and participant's personal goals.       Expected Outcomes Short Term: Increase workloads from initial exercise prescription for resistance, speed, and METs.;Long Term: Improve cardiorespiratory fitness, muscular endurance and strength as measured by increased METs and functional capacity ( );Short Term: Perform resistance training exercises routinely during rehab and add in resistance training at home       Able to understand and use rate of perceived exertion (RPE) scale Yes       Intervention Provide education and explanation on how to use RPE scale       Expected Outcomes Short Term: Able to use RPE daily in rehab to express subjective intensity level;Long Term:  Able to use RPE to guide intensity level when exercising independently       Able to understand and use Dyspnea scale Yes       Intervention Provide education and explanation on how to use Dyspnea scale       Expected Outcomes Short Term: Able to use Dyspnea scale daily in rehab to express subjective sense of shortness of breath during exertion;Long Term: Able to use Dyspnea scale to guide intensity level when exercising independently       Knowledge and understanding of Target Heart Rate Range (THRR) Yes       Intervention Provide education and explanation of THRR  including how the numbers were predicted and where they are located for reference       Expected Outcomes Short Term: Able to state/look up THRR;Short Term: Able to use daily as guideline for intensity in rehab;Long Term: Able to use THRR to govern intensity when exercising independently       Able to check pulse independently Yes  Intervention Review the importance of being able to check your own pulse for safety during independent exercise;Provide education and demonstration on how to check pulse in carotid and radial arteries.       Expected Outcomes Short Term: Able to explain why pulse checking is important during independent exercise;Long Term: Able to check pulse independently and accurately       Understanding of Exercise Prescription Yes       Intervention Provide education, explanation, and written materials on patient's individual exercise prescription       Expected Outcomes Short Term: Able to explain program exercise prescription;Long Term: Able to explain home exercise prescription to exercise independently          Exercise Goals Re-Evaluation :  Exercise Goals Re-Evaluation     Row Name 09/26/24 0848 10/03/24 1127 10/25/24 1420 11/01/24 1344       Exercise Goal Re-Evaluation   Exercise Goals Review Increase Physical Activity;Able to understand and use rate of perceived exertion (RPE) scale;Knowledge and understanding of Target Heart Rate Range (THRR);Understanding of Exercise Prescription;Increase Strength and Stamina;Able to check pulse independently;Able to understand and use Dyspnea scale Increase Physical Activity;Increase Strength and Stamina;Understanding of Exercise Prescription Increase Physical Activity;Increase Strength and Stamina;Understanding of Exercise Prescription Increase Physical Activity;Increase Strength and Stamina;Understanding of Exercise Prescription    Comments Reviewed RPE and dyspnea scale, THR and program prescription with pt today.  Pt voiced  understanding and was given a copy of goals to take home. Bricen is off to a good start in the program, and was able to attend his first 3 sessions during this review period. During those sessions he was able to use the treadmill at a speed of 2. and 1% incline, and use the T6 nustep at level 2. We will continue to monitor his progress in the program. Lennyn is doing well in the program. He continues to walk on the treadmill at a speed of 2.8 mph, but decreased his incline form 1% to 0%. He also began using the airdyne bike and has done well at level 1. We will continue to monitor his progress in the program. Jarad continues to do well in the program. He was recently able to increase his incline on the treadmill from 0 to 2%. He was also able to increase from level 1 to 6 on the recumbent bike. We will continue to monitor his progress in the program.    Expected Outcomes Short: Use RPE daily to regulate intensity. Long: Follow program prescription in THR. Short: Continue to follow exercise prescription. Long: Continue exercise to improve strength and stamina Short: Continue to progressively increase treadmill workload. Long: Continue exercise to improve strength and stamina. Short: Continue to progressively increase treadmill workload. Long: Continue exercise to improve strength and stamina.       Discharge Exercise Prescription (Final Exercise Prescription Changes):  Exercise Prescription Changes - 11/01/24 1300       Response to Exercise   Blood Pressure (Admit) 132/70    Blood Pressure (Exercise) 134/64    Blood Pressure (Exit) 118/60    Heart Rate (Admit) 70 bpm    Heart Rate (Exercise) 123 bpm    Heart Rate (Exit) 81 bpm    Rating of Perceived Exertion (Exercise) 15    Symptoms none    Duration Continue with 30 min of aerobic exercise without signs/symptoms of physical distress.    Intensity THRR unchanged      Progression   Progression Continue to progress workloads to maintain intensity  without signs/symptoms of physical distress.    Average METs 3.4      Resistance Training   Weight 5 lb    Reps 10-15      Interval Training   Interval Training No      Treadmill   MPH 2.7    Grade 2    Minutes 15    METs 3.81      Bike   Level 6    Watts 43    Minutes 15    METs 3.27      NuStep   Level 3   T6   Minutes 15    METs 3.3      REL-XR   Level 2    Minutes 15    METs 4.2      Biostep-RELP   Level 3    Minutes 15    METs 3      Rower   Level 10    Watts 17    Minutes 15    METs 4.12      Oxygen   Maintain Oxygen Saturation 88% or higher          Nutrition:  Target Goals: Understanding of nutrition guidelines, daily intake of sodium 1500mg , cholesterol 200mg , calories 30% from fat and 7% or less from saturated fats, daily to have 5 or more servings of fruits and vegetables.  Education: Nutrition 1 -Group instruction provided by verbal, written material, interactive activities, discussions, models, and posters to present general guidelines for heart healthy nutrition including macronutrients, label reading, and promoting whole foods over processed counterparts. Education serves as pensions consultant of discussion of heart healthy eating for all. Written material provided at class time.    Education: Nutrition 2 -Group instruction provided by verbal, written material, interactive activities, discussions, models, and posters to present general guidelines for heart healthy nutrition including sodium, cholesterol, and saturated fat. Providing guidance of habit forming to improve blood pressure, cholesterol, and body weight. Written material provided at class time.     Biometrics:  Pre Biometrics - 09/22/24 1635       Pre Biometrics   Height 6' 1.1 (1.857 m)    Weight 230 lb 1.6 oz (104.4 kg)    Waist Circumference 44 inches    Hip Circumference 42 inches    Waist to Hip Ratio 1.05 %    BMI (Calculated) 30.27    Single Leg Stand 30 seconds            Nutrition Therapy Plan and Nutrition Goals:   Nutrition Assessments:  MEDIFICTS Score Key: >=70 Need to make dietary changes  40-70 Heart Healthy Diet <= 40 Therapeutic Level Cholesterol Diet  Flowsheet Row Cardiac Rehab from 09/22/2024 in [facility] Cardiac and Pulmonary Rehab  Picture Your Plate Total Score on Admission 46   Picture Your Plate Scores: <59 Unhealthy dietary pattern with much room for improvement. 41-50 Dietary pattern unlikely to meet recommendations for good health and room for improvement. 51-60 More healthful dietary pattern, with some room for improvement.  >60 Healthy dietary pattern, although there may be some specific behaviors that could be improved.    Nutrition Goals Re-Evaluation:   Nutrition Goals Discharge (Final Nutrition Goals Re-Evaluation):   Psychosocial: Target Goals: Acknowledge presence or absence of significant depression and/or stress, maximize coping skills, provide positive support system. Participant is able to verbalize types and ability to use techniques and skills needed for reducing stress and depression.   Education: Stress, Anxiety, and Depression - Group verbal  and visual presentation to define topics covered.  Reviews how body is impacted by stress, anxiety, and depression.  Also discusses healthy ways to reduce stress and to treat/manage anxiety and depression. Written material provided at class time.   Education: Sleep Hygiene -Provides group verbal and written instruction about how sleep can affect your health.  Define sleep hygiene, discuss sleep cycles and impact of sleep habits. Review good sleep hygiene tips.   Initial Review & Psychosocial Screening:  Initial Psych Review & Screening - 09/22/24 1623       Initial Review   Current issues with None Identified      Family Dynamics   Good Support System? Yes   wife, children and church     Barriers   Psychosocial barriers to participate in program There  are no identifiable barriers or psychosocial needs.;The patient should benefit from training in stress management and relaxation.      Screening Interventions   Interventions Encouraged to exercise;To provide support and resources with identified psychosocial needs;Provide feedback about the scores to participant    Expected Outcomes Short Term goal: Utilizing psychosocial counselor, staff and physician to assist with identification of specific Stressors or current issues interfering with healing process. Setting desired goal for each stressor or current issue identified.;Long Term Goal: Stressors or current issues are controlled or eliminated.;Short Term goal: Identification and review with participant of any Quality of Life or Depression concerns found by scoring the questionnaire.;Long Term goal: The participant improves quality of Life and PHQ9 Scores as seen by post scores and/or verbalization of changes          Quality of Life Scores:   Quality of Life - 09/22/24 1638       Quality of Life   Select Quality of Life      Quality of Life Scores   Health/Function Pre 26.6 %    Socioeconomic Pre 21.44 %    Psych/Spiritual Pre 24.43 %    Family Pre 30 %    GLOBAL Pre 25.47 %         Scores of 19 and below usually indicate a poorer quality of life in these areas.  A difference of  2-3 points is a clinically meaningful difference.  A difference of 2-3 points in the total score of the Quality of Life Index has been associated with significant improvement in overall quality of life, self-image, physical symptoms, and general health in studies assessing change in quality of life.  PHQ-9: Review Flowsheet       09/22/2024  Depression screen PHQ 2/9  Decreased Interest 0  Down, Depressed, Hopeless 0  PHQ - 2 Score 0  Altered sleeping 0  Tired, decreased energy 0  Change in appetite 0  Feeling bad or failure about yourself  0  Trouble concentrating 0  Moving slowly or  fidgety/restless 0  Suicidal thoughts 0  PHQ-9 Score 0   Interpretation of Total Score  Total Score Depression Severity:  1-4 = Minimal depression, 5-9 = Mild depression, 10-14 = Moderate depression, 15-19 = Moderately severe depression, 20-27 = Severe depression   Psychosocial Evaluation and Intervention:  Psychosocial Evaluation - 09/22/24 1624       Psychosocial Evaluation & Interventions   Interventions Stress management education;Relaxation education;Encouraged to exercise with the program and follow exercise prescription    Comments Cadon is looking forward to starting cardiac rehab after his recent TAVR. He has a long history of smoking 1-2 packs of cigarettes/day, however has recently decreased  his smoking to about 14/day and only outside. He states he is writing down every time he smokes and it has helped him to visualize the amount he was smoking. He does report feeling better since decreasing his tobacco intake. Edwyn denies any stress or sleep concerns and states that his family and  church have been very supportive. 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    Expected Outcomes Short: Attend cardiac rehab for education and exercise. Long: Develop and maintain positive self care habits.    Continue Psychosocial Services  Follow up required by staff          Psychosocial Re-Evaluation:   Psychosocial Discharge (Final Psychosocial Re-Evaluation):   Vocational Rehabilitation: Provide vocational rehab assistance to qualifying candidates.   Vocational Rehab Evaluation & Intervention:  Vocational Rehab - 09/22/24 1623       Initial Vocational Rehab Evaluation & Intervention   Assessment shows need for Vocational Rehabilitation No          Education: Education Goals: Education classes will be provided on a variety of topics geared toward better understanding of heart health and risk factor  modification. Participant will state understanding/return demonstration of topics presented as noted by education test scores.  Learning Barriers/Preferences:  Learning Barriers/Preferences - 09/22/24 1623       Learning Barriers/Preferences   Learning Barriers None    Learning Preferences None          General Cardiac Education Topics:  AED/CPR: - Group verbal and written instruction with the use of models to demonstrate the basic use of the AED with the basic ABC's of resuscitation.   Test and Procedures: - Group verbal and visual presentation and models provide information about basic cardiac anatomy and function. Reviews the testing methods done to diagnose heart disease and the outcomes of the test results. Describes the treatment choices: Medical Management, Angioplasty, or Coronary Bypass Surgery for treating various heart conditions including Myocardial Infarction, Angina, Valve Disease, and Cardiac Arrhythmias. Written material provided at class time.   Medication Safety: - Group verbal and visual instruction to review commonly prescribed medications for heart and lung disease. Reviews the medication, class of the drug, and side effects. Includes the steps to properly store meds and maintain the prescription regimen. Written material provided at class time.   Intimacy: - Group verbal instruction through game format to discuss how heart and lung disease can affect sexual intimacy. Written material provided at class time.   Know Your Numbers and Heart Failure: - Group verbal and visual instruction to discuss disease risk factors for cardiac and pulmonary disease and treatment options.  Reviews associated critical values for Overweight/Obesity, Hypertension, Cholesterol, and Diabetes.  Discusses basics of heart failure: signs/symptoms and treatments.  Introduces Heart Failure Zone chart for action plan for heart failure. Written material provided at class time.   Infection  Prevention: - Provides verbal and written material to individual with discussion of infection control including proper hand washing and proper equipment cleaning during exercise session. Flowsheet Row Cardiac Rehab from 10/05/2024 in [facility] Cardiac and Pulmonary Rehab  Date 09/22/24  Educator [facility]  Instruction Review Code 1- Verbalizes Understanding    Falls Prevention: - Provides verbal and written material to individual with discussion of falls prevention and safety. Flowsheet Row Cardiac Rehab from 10/05/2024 in [facility] Cardiac and Pulmonary Rehab  Date 09/22/24  Educator [facility]  Instruction Review Code 1- Verbalizes Understanding  Other: -Provides group and verbal instruction on various topics (see comments)   Knowledge Questionnaire Score:  Knowledge Questionnaire Score - 09/22/24 1637       Knowledge Questionnaire Score   Pre Score 21/26          Core Components/Risk Factors/Patient Goals at Admission:  Personal Goals and Risk Factors at Admission - 09/22/24 1622       Core Components/Risk Factors/Patient Goals on Admission    Weight Management Yes    Intervention Weight Management: Develop a combined nutrition and exercise program designed to reach desired caloric intake, while maintaining appropriate intake of nutrient and fiber, sodium and fats, and appropriate energy expenditure required for the weight goal.;Weight Management: Provide education and appropriate resources to help participant work on and attain dietary goals.;Weight Management/Obesity: Establish reasonable short term and long term weight goals.    Goal Weight: Long Term 220 lb (99.8 kg)    Expected Outcomes Short Term: Continue to assess and modify interventions until short term weight is achieved;Long Term: Adherence to nutrition and physical activity/exercise program aimed toward attainment of established weight goal;Weight Maintenance: Understanding of the daily nutrition guidelines, which includes 25-35%  calories from fat, 7% or less cal from saturated fats, less than 200mg  cholesterol, less than 1.5gm of sodium, & 5 or more servings of fruits and vegetables daily;Weight Loss: Understanding of general recommendations for a balanced deficit meal plan, which promotes 1-2 lb weight loss per week and includes a negative energy balance of [id number] kcal/d;Understanding recommendations for meals to include 15-35% energy as protein, 25-35% energy from fat, 35-60% energy from carbohydrates, less than 200mg  of dietary cholesterol, 20-35 gm of total fiber daily;Understanding of distribution of calorie intake throughout the day with the consumption of 4-5 meals/snacks    Tobacco Cessation Yes    Number of packs per day 0.5    Intervention Assist the participant in steps to quit. Provide individualized education and counseling about committing to Tobacco Cessation, relapse prevention, and pharmacological support that can be provided by physician.;Education officer, environmental, assist with locating and accessing local/national Quit Smoking programs, and support quit date choice.    Expected Outcomes Short Term: Will demonstrate readiness to quit, by selecting a quit date.;Long Term: Complete abstinence from all tobacco products for at least 12 months from quit date.    Hypertension Yes    Intervention Provide education on lifestyle modifcations including regular physical activity/exercise, weight management, moderate sodium restriction and increased consumption of fresh fruit, vegetables, and low fat dairy, alcohol moderation, and smoking cessation.;Monitor prescription use compliance.    Expected Outcomes Short Term: Continued assessment and intervention until BP is < 140/34mm HG in hypertensive participants. < 130/78mm HG in hypertensive participants with diabetes, heart failure or chronic kidney disease.;Long Term: Maintenance of blood pressure at goal levels.    Lipids Yes    Intervention Provide education and  support for participant on nutrition & aerobic/resistive exercise along with prescribed medications to achieve LDL 70mg , HDL >40mg .    Expected Outcomes Short Term: Participant states understanding of desired cholesterol values and is compliant with medications prescribed. Participant is following exercise prescription and nutrition guidelines.;Long Term: Cholesterol controlled with medications as prescribed, with individualized exercise RX and with personalized nutrition plan. Value goals: LDL < 70mg , HDL > 40 mg.          Education:Diabetes - Individual verbal and written instruction to review signs/symptoms of diabetes, desired ranges of glucose level fasting, after meals and with exercise. Acknowledge that pre and  post exercise glucose checks will be done for 3 sessions at entry of program.   Core Components/Risk Factors/Patient Goals Review:    Core Components/Risk Factors/Patient Goals at Discharge (Final Review):    ITP Comments:  ITP Comments     Row Name 09/22/24 1607 09/26/24 0848 10/05/24 0811 11/02/24 1012     ITP Comments Completed program orientation and . Initial ITP created and sent for review to Medical Director.    Aahil is a current tobacco user. Intervention for tobacco cessation was provided at the initial medical review. He was asked about readiness to quit and reported that he is not ready to quit, however has significantly decreased his tobacco use in the last few weeks . Patient was advised and educated about tobacco cessation using combination therapy, tobacco cessation classes, quit line, and quit smoking apps. Patient demonstrated understanding of this material. Staff will continue to provide encouragement and follow up with the patient throughout the program. First full day of exercise!  Patient was oriented to gym and equipment including functions, settings, policies, and procedures.  Patient's individual exercise prescription and treatment plan were reviewed.   All starting workloads were established based on the results of the 6 minute walk test done at initial orientation visit.  The plan for exercise progression was also introduced and progression will be customized based on patient's performance and goals. 30 Day review completed. Medical Director ITP review done, changes made as directed, and signed approval by Medical Director. New to program. 30 Day review completed. Medical Director ITP review done, changes made as directed, and signed approval by Medical Director.       Comments: 30 Day Review     [1]  Current Outpatient Medications:    amLODipine  (NORVASC ) 10 MG tablet, Take 1 tablet (10 mg total) by mouth daily., Disp: 90 tablet, Rfl: 3   amoxicillin  (AMOXIL ) 500 MG capsule, Take 4 capsules (2,000 mg total) by mouth as directed 1 hour prior to dental work including cleanings, Disp: 12 capsule, Rfl: 12   aspirin  EC 81 MG tablet, Take 81 mg by mouth every evening., Disp: , Rfl:    atorvastatin  (LIPITOR) 40 MG tablet, TAKE 1 TABLET BY MOUTH EVERY DAY, Disp: 90 tablet, Rfl: 3   carvedilol  (COREG ) 3.125 MG tablet, TAKE 1 TABLET(3.125 MG) BY MOUTH TWICE DAILY, Disp: 180 tablet, Rfl: 3   chlorhexidine  (PERIDEX ) 0.12 % solution, Use as directed 5 mLs in the mouth or throat 2 (two) times daily., Disp: , Rfl:    CINNAMON PO, Take 1 capsule by mouth at bedtime., Disp: , Rfl:    gabapentin  (NEURONTIN ) 100 MG capsule, Take 100 mg by mouth 2 (two) times daily., Disp: , Rfl:    gabapentin  (NEURONTIN ) 300 MG capsule, Take 300 mg by mouth at bedtime., Disp: , Rfl:    lisinopril  (ZESTRIL ) 40 MG tablet, Take 1 tablet (40 mg total) by mouth daily., Disp: 90 tablet, Rfl: 3   Multiple Vitamin (MULTIVITAMIN) capsule, Take 1 capsule by mouth daily. AM, Disp: , Rfl:    Omega-3 Fatty Acids (FISH OIL) 1000 MG CAPS, Take 1 capsule by mouth at bedtime., Disp: , Rfl:    omeprazole  (PRILOSEC  OTC) 20 MG tablet, Take 20 mg by mouth every evening., Disp: , Rfl:  [2]   Social History Tobacco Use  Smoking Status Every Day   Current packs/day: 1.50   Average packs/day: 1.5 packs/day for 54.6 years (81.9 ttl pk-yrs)   Types: Cigarettes   Start date: 03/27/1970  Smokeless Tobacco Never  Tobacco Comments   Smokes 1.5-2 packs per day

## 2024-11-03 ENCOUNTER — Other Ambulatory Visit

## 2024-11-04 ENCOUNTER — Encounter

## 2024-11-04 DIAGNOSIS — Z48812 Encounter for surgical aftercare following surgery on the circulatory system: Secondary | ICD-10-CM | POA: Diagnosis not present

## 2024-11-04 DIAGNOSIS — Z952 Presence of prosthetic heart valve: Secondary | ICD-10-CM

## 2024-11-04 NOTE — Progress Notes (Signed)
 Daily Session Note  Patient Details  Name: Chase [name] MRN: [id number] Date of Birth: 16-Jul-1956 Referring Provider:   Flowsheet Row Cardiac Rehab from 09/22/2024 in [facility] Cardiac and Pulmonary Rehab  Referring Provider Dr. [name]    Encounter Date: 11/04/2024  Check In:  Session Check In - 11/04/24 0925       Check-In   Supervising physician immediately available to respond to emergencies See telemetry face sheet for immediately available ER MD    Location [facility]-Cardiac & Pulmonary Rehab    Staff Present Burnard Davenport RN,BSN,MPA;Joseph Hood RCP,RRT,BSRT;Maxon Conetta BS, Exercise Physiologist;Noah Tickle, BS, Exercise Physiologist    Virtual Visit No    Medication changes reported     No    Fall or balance concerns reported    No    Warm-up and Cool-down Performed on first and last piece of equipment    Resistance Training Performed Yes    VAD Patient? No    PAD/SET Patient? No      Pain Assessment   Currently in Pain? No/denies             Tobacco Use History[1]  Goals Met:  Independence with exercise equipment Exercise tolerated well No report of concerns or symptoms today Strength training completed today  Goals Unmet:  Not Applicable  Comments: Pt able to follow exercise prescription today without complaint.  Will continue to monitor for progression.    Dr. Oneil Pinal is Medical Director for [facility] Cardiac Rehabilitation.  Dr. Fuad Aleskerov is Medical Director for [facility] Pulmonary Rehabilitation.    [1]  Social History Tobacco Use  Smoking Status Every Day   Current packs/day: 1.50   Average packs/day: 1.5 packs/day for 54.6 years (81.9 ttl pk-yrs)   Types: Cigarettes   Start date: 03/27/1970  Smokeless Tobacco Never  Tobacco Comments   Smokes 1.5-2 packs per day

## 2024-11-07 ENCOUNTER — Encounter

## 2024-11-07 DIAGNOSIS — Z952 Presence of prosthetic heart valve: Secondary | ICD-10-CM

## 2024-11-07 DIAGNOSIS — Z48812 Encounter for surgical aftercare following surgery on the circulatory system: Secondary | ICD-10-CM | POA: Diagnosis not present

## 2024-11-07 NOTE — Progress Notes (Signed)
 Daily Session Note  Patient Details  Name: Chase [name] MRN: [id number] Date of Birth: [birth year] Referring Provider:   Flowsheet Row Cardiac Rehab from 09/22/2024 in [facility] Cardiac and Pulmonary Rehab  Referring Provider Dr. [name]    Encounter Date: 11/07/2024  Check In:  Session Check In - 11/07/24 0910       Check-In   Supervising physician immediately available to respond to emergencies See telemetry face sheet for immediately available ER MD    Location [facility]-Cardiac & Pulmonary Rehab    Staff Present Burnard Davenport RN,BSN,MPA;Joseph Hood RCP,RRT,BSRT;Maxon Burnell BS, Exercise Physiologist;Jamielyn Petrucci Dyane HECKLE, ACSM CEP, Exercise Physiologist    Virtual Visit No    Medication changes reported     No    Fall or balance concerns reported    No    Warm-up and Cool-down Performed on first and last piece of equipment    Resistance Training Performed Yes    VAD Patient? No    PAD/SET Patient? No      Pain Assessment   Currently in Pain? No/denies             Tobacco Use History[1]  Goals Met:  Independence with exercise equipment Exercise tolerated well No report of concerns or symptoms today Strength training completed today  Goals Unmet:  Not Applicable  Comments: Pt able to follow exercise prescription today without complaint.  Will continue to monitor for progression.    Dr. Oneil Pinal is Medical Director for [facility] Cardiac Rehabilitation.  Dr. Fuad Aleskerov is Medical Director for [facility] Pulmonary Rehabilitation.    [1]  Social History Tobacco Use  Smoking Status Every Day   Current packs/day: 1.50   Average packs/day: 1.5 packs/day for 54.6 years (81.9 ttl pk-yrs)   Types: Cigarettes   Start date: 03/27/1970  Smokeless Tobacco Never  Tobacco Comments   Smokes 1.5-2 packs per day

## 2024-11-09 ENCOUNTER — Encounter

## 2024-11-09 ENCOUNTER — Ambulatory Visit: Payer: Self-pay

## 2024-11-09 NOTE — Telephone Encounter (Signed)
" °  FYI Only or Action Required?: Action required by provider: request for appointment and request to reestablish with Dr. Gasper. Requests call back today  Patient was last seen in primary care on 2019, last encounter results-echo follow up 08/2024.  Called Nurse Triage reporting Dysuria.  Symptoms began yesterday.  Interventions attempted: Rest, hydration, or home remedies.  Symptoms are: gradually worsening.  Triage Disposition: See Physician Within 24 Hours  Patient/caregiver understands and will follow disposition?: No, refuses disposition  Reason for Disposition  All other males with painful urination  Answer Assessment - Initial Assessment Questions Additional info:  Patient spouse calling to schedule an acute appointment for symptoms of UTI-dysuria, frequency, urgency, fever. Patient has not had a pcp appointment since 2019-spouse states this is because he is followed by cardiology and had no pcp needs, last pcp contact 09/08/2024 results-echo follow up. Patient needs new patient appointment, he would like to stay with Dr. Gasper who in not accepting new patients, this writer called to [facility] and explained situation, this writer was advised to send high priority message to [facility] clinical pool for Dr. Gasper to review and advise if ok for new patient/reestablish appointment.  Advised appointment needed within 24 hours for evaluation of uti symptoms, spouse will await call back to determine if he can reestablish with Dr. Gasper and if acute appointment approved.   1. SEVERITY: How bad is the pain?  (e.g., Scale 1-10; mild, moderate, or severe)     Burning, urgency  2. FREQUENCY: How many times have you had painful urination today?      each 3. PATTERN: Is pain present every time you urinate or just sometimes?      Each  4. ONSET: When did the painful urination start?      11/08/24 5. FEVER: Do you have a fever? If Yes, ask: What is your temperature, how was it measured, and  when did it start?     Tactile, chills, sweating  6. PAST UTI: Have you had a urine infection before? If Yes, ask: When was the last time? and What happened that time?      no 7. CAUSE: What do you think is causing the painful urination?      uti 8. OTHER SYMPTOMS: Do you have any other symptoms? (e.g., flank pain, penis discharge, scrotal pain, blood in urine)     Denies all other symptoms  Protocols used: Urination Pain - Male-A-AH Reason for Triage: Patient has a fever, chills and sweats, and pain while urinating and feels the needs to go but is barely able to go, symptoms started yesterday. "

## 2024-11-09 NOTE — Telephone Encounter (Signed)
 Spoke with Chase [name] and advised Dr Gasper is not accepting new Chase [name], but we could est care with a provider is accepting Chase [name]. Chase [name] stated he would talk with his wife and call us  back if he would like to establish care with a provider that is accepting Chase [name]. E2C2 pls schedule Chase [name] if he proceeds with scheduling with Dr Franchot, Dr Lang or Janna.

## 2024-11-10 ENCOUNTER — Ambulatory Visit
Admission: EM | Admit: 2024-11-10 | Discharge: 2024-11-10 | Disposition: A | Discharge status: Home / Self Care | Attending: Emergency Medicine | Admitting: Emergency Medicine

## 2024-11-10 ENCOUNTER — Ambulatory Visit: Payer: Self-pay

## 2024-11-10 DIAGNOSIS — R34 Anuria and oliguria: Secondary | ICD-10-CM | POA: Insufficient documentation

## 2024-11-10 DIAGNOSIS — N3000 Acute cystitis without hematuria: Secondary | ICD-10-CM | POA: Diagnosis not present

## 2024-11-10 LAB — POCT URINE DIPSTICK
Blood, UA: NEGATIVE
Glucose, UA: 100 mg/dL — AB
Nitrite, UA: POSITIVE — AB
POC PROTEIN,UA: 100 — AB
Spec Grav, UA: 1.02
Urobilinogen, UA: 2 U/dL — AB
pH, UA: 5

## 2024-11-10 MED ORDER — NITROFURANTOIN MONOHYD MACRO 100 MG PO CAPS: 100.0000 mg | ORAL_CAPSULE | Freq: Two times a day (BID) | ORAL | 0 refills | Status: AC

## 2024-11-10 NOTE — ED Provider Notes (Signed)
 " Chase [name]    CSN: [id number] Arrival date & time: 11/10/24  0807      History   Chief Complaint Chief Complaint  Patient presents with   Urinary Retention    HPI Chase [name] is a 69 y.o. male.   Patient presents for evaluation of decreased urine output, urinary frequency, dysuria and a low-grade fever peaking at 100 beginning 2 days ago.  Last occurrence of fever yesterday evening.  Took home UTI test which was positive, attempted use of Pyridium last dosage yesterday evening.  Denies abdominal or flank pain.  Unsure presence of hematuria due to discoloration from Pyridium.  Past Medical History:  Diagnosis Date   Arthritis    maybe - hands   Chest pain    a. ETT 06/14/2015: no st segment or T-waves changes during stress, mildly reduced exercise capacity, hypertensive response to exercise   Chicken pox    Chronic venous insufficiency    a. improved with support hose.   Erectile dysfunction    Essential hypertension    CONTROLLED ON MEDS   History of echocardiogram    a. 03/2016 Echo: EF 55-60%, no rwma, mild AS, triv AI, mildly dil Ao.   HLD (hyperlipidemia)    Measles    Mumps    Polysubstance abuse (HCC)    a. 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 Daniel and Dr. 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   History of aortic stenosis 07/01/2024   Thyroid  nodule 05/27/2019   Fatty liver 05/27/2019   Obesity 04/09/2016   Alcohol abuse 04/08/2016   Rectal polyp    Essential hypertension 05/08/2015   Hyperlipidemia 05/08/2015   Erectile dysfunction 03/28/2015   Tobacco abuse 03/28/2015    Past Surgical History:  Procedure Laterality Date   APPENDECTOMY  10/20/1970   COLONOSCOPY WITH PROPOFOL   N/A 08/27/2015   Procedure: COLONOSCOPY WITH PROPOFOL ;  Surgeon: Rogelia Copping, MD;  Location: [facility];  Service: Endoscopy;  Laterality: N/A;   INTRAOPERATIVE TRANSTHORACIC ECHOCARDIOGRAM N/A 09/06/2024   Procedure: ECHOCARDIOGRAM, TRANSTHORACIC;  Surgeon: Verlin Lonni JONETTA, MD;  Location: [facility] INVASIVE CV [facility];  Service: Cardiovascular;  Laterality: N/A;   POLYPECTOMY  08/27/2015   Procedure: POLYPECTOMY;  Surgeon: Rogelia Copping, MD;  Location: [facility];  Service: Endoscopy;;   RIGHT HEART CATH AND CORONARY ANGIOGRAPHY Bilateral 07/01/2024   Procedure: RIGHT HEART CATH AND CORONARY ANGIOGRAPHY;  Surgeon: Darron Deatrice LABOR, MD;  Location: [facility] INVASIVE CV LAB;  Service: Cardiovascular;  Laterality: Bilateral;   TRANSCATHETER AORTIC VALVE REPLACEMENT, TRANSFEMORAL  09/06/2024   Con Daniel, MD and Lonni Verlin, MD       Home Medications    Prior to Admission medications  Medication Sig Start Date End Date Taking? Authorizing Provider  amLODipine  (NORVASC ) 10 MG tablet Take 1 tablet (10 mg total) by mouth daily. 08/26/24  Yes Verlin Lonni JONETTA, MD  aspirin  EC 81 MG tablet Take 81 mg by mouth every evening.   Yes [provider]  atorvastatin  (LIPITOR) 40 MG tablet TAKE 1 TABLET BY MOUTH EVERY DAY 07/11/24  Yes Dunn, Bernardino HERO, PA-C  carvedilol  (COREG ) 3.125 MG tablet TAKE 1 TABLET(3.125 MG) BY MOUTH TWICE DAILY  11/10/23  Yes Dunn, Bernardino HERO, PA-C  CINNAMON PO Take 1 capsule by mouth at bedtime.   Yes [provider]  gabapentin  (NEURONTIN ) 100 MG capsule Take 100 mg by mouth 2 (two) times daily. 04/21/22  Yes [provider]  gabapentin  (NEURONTIN ) 300 MG capsule Take 300 mg by mouth at bedtime. 04/21/22  Yes [provider]  lisinopril  (ZESTRIL ) 40 MG tablet Take 1 tablet (40 mg total) by mouth daily. 01/26/24  Yes Darron Deatrice LABOR, MD  Multiple Vitamin (MULTIVITAMIN) capsule Take 1 capsule by mouth daily. AM   Yes [provider]  Omega-3 Fatty Acids (FISH OIL) 1000 MG CAPS Take 1 capsule by mouth at bedtime.   Yes [provider]  omeprazole  (PRILOSEC  OTC) 20 MG tablet Take 20 mg by mouth every evening.   Yes [provider]  amoxicillin  (AMOXIL ) 500 MG capsule Take 4 capsules (2,000 mg total) by mouth as directed 1 hour prior to dental work including cleanings 09/12/24   Sebastian Lamarr SAUNDERS, PA-C  chlorhexidine  (PERIDEX ) 0.12 % solution Use as directed 5 mLs in the mouth or throat 2 (two) times daily. 08/09/24   [provider]    Family History Family History  Problem Relation Age of Onset   Heart disease Mother        Aortic stenosis   Pancreatic cancer Father     Social History Social History[1]   Allergies   Patient has no known allergies.   Review of Systems Review of Systems  Genitourinary:  Positive for dysuria and frequency. Negative for decreased urine volume, difficulty urinating, enuresis, flank pain, genital sores, hematuria, penile discharge, penile pain, penile swelling, scrotal swelling, testicular pain and urgency.     Physical Exam Triage Vital Signs ED Triage Vitals  Encounter Vitals Group     BP 11/10/24 0842 95/65     Girls Systolic BP Percentile --      Girls Diastolic BP Percentile --      Boys Systolic BP Percentile --      Boys Diastolic BP Percentile --      Pulse Rate 11/10/24 0842 83     Resp 11/10/24 0842 16     Temp 11/10/24 0842 98 F (36.7 C)     Temp src --      SpO2 11/10/24 0842 96 %     Weight --      Height --      Head Circumference --      Peak Flow --      Pain Score 11/10/24 0840 2     Pain Loc --      Pain Education --      Exclude from Growth Chart --    No data found.  Updated Vital Signs BP 95/65   Pulse 83   Temp 98 F (36.7 C)   Resp 16   SpO2 96%   Visual Acuity Right Eye Distance:   Left Eye Distance:   Bilateral Distance:    Right Eye Near:   Left Eye Near:    Bilateral Near:      Physical Exam Constitutional:      Appearance: Normal appearance.  Eyes:     Extraocular Movements: Extraocular movements intact.  Pulmonary:     Effort: Pulmonary effort is normal.  Abdominal:     Tenderness: There is no abdominal tenderness. There is no right CVA tenderness, left CVA tenderness or guarding.  Neurological:     Mental Status: He  is alert and oriented to person, place, and time.      UC Treatments / Results  Labs (all labs ordered are listed, but only abnormal results are displayed) Labs Reviewed  URINE CULTURE  POCT URINE DIPSTICK    EKG   Radiology No results found.  Procedures Procedures (including critical care time)  Medications Ordered in UC Medications - No data to display  Initial Impression / Assessment and Plan / UC Course  I have reviewed the triage vital signs and the nursing notes.  Pertinent labs & imaging results that were available during my care of the patient were reviewed by me and considered in my medical decision making (see chart for details).  Acute cystitis without hematuria, decreased urine output  Urinalysis showing leukocytes and nitrates, sent for culture, discussed findings with patient and spouse, prescribed Pyridium for management recommended continued use of Pyridium and over-the-counter analgesics as needed advised to monitor closely and recommended increase fluid intake with follow-up if symptoms persist or worsen Final Clinical Impressions(s) / UC Diagnoses   Final diagnoses:  Decreased urine output   Discharge Instructions   None    ED Prescriptions   None    PDMP not reviewed this encounter.     [1]  Social History Tobacco Use   Smoking status: Every Day    Current packs/day: 1.50    Average packs/day: 1.5 packs/day for 54.6 years (81.9 ttl pk-yrs)    Types: Cigarettes    Start date: 03/27/1970   Smokeless tobacco: Never   Tobacco comments:    Smokes 1.5-2 packs per day   Substance Use Topics    Alcohol use: Yes    Alcohol/week: 41.0 standard drinks of alcohol    Types: 40 Cans of beer, 1 Shots of liquor per week    Comment: drinks 6-7 beers daily   Drug use: No     Teresa Shelba SAUNDERS, NP 11/10/24 [phone]  "

## 2024-11-10 NOTE — ED Triage Notes (Signed)
 Pt present with c/o urinary retention and states when he does urinate he does not urinate much. Pt states he did an at home  urine test. States he has discomfort when trying to urinate. Pt states he was running a fever over 100 last night.

## 2024-11-10 NOTE — Discharge Instructions (Signed)
 Your urinalysis shows Chase [name] blood cells and nitrates which are indicative of infection, your urine will be sent to the lab to determine exactly which bacteria is present, if any changes need to be made to your medications you will be notified  Begin use of Macrobid  twice daily for 7 days  You may use over-the-counter Pyridium to help minimize your symptoms until antibiotic removes bacteria, this medication will turn your urine orange  Increase your fluid intake through use of water   May take Tylenol  as needed for pain  If symptoms continue to persist after use of medication or recur please follow-up with urgent care or your primary doctor as needed

## 2024-11-11 ENCOUNTER — Encounter

## 2024-11-12 LAB — URINE CULTURE: Culture: >=100000 — AB

## 2024-11-14 ENCOUNTER — Encounter

## 2024-11-15 ENCOUNTER — Ambulatory Visit (HOSPITAL_COMMUNITY): Payer: Self-pay

## 2024-11-16 ENCOUNTER — Encounter

## 2024-11-16 DIAGNOSIS — Z48812 Encounter for surgical aftercare following surgery on the circulatory system: Secondary | ICD-10-CM | POA: Diagnosis not present

## 2024-11-16 DIAGNOSIS — Z952 Presence of prosthetic heart valve: Secondary | ICD-10-CM

## 2024-11-16 NOTE — Progress Notes (Signed)
 Daily Session Note  Patient Details  Name: Chase [name] MRN: [id number] Date of Birth: April 12, 1956 Referring Provider:   Flowsheet Row Cardiac Rehab from 09/22/2024 in [facility] Cardiac and Pulmonary Rehab  Referring Provider Dr. [name]    Encounter Date: 11/16/2024  Check In:  Session Check In - 11/16/24 0943       Check-In   Supervising physician immediately available to respond to emergencies See telemetry face sheet for immediately available ER MD    Location [facility]-Cardiac & Pulmonary Rehab    Staff Present Burnard Davenport RN,BSN,MPA;Maxon Conetta BS, Exercise Physiologist;Margaret Best, MS, Exercise Physiologist;Noah Tickle, BS, Exercise Physiologist    Virtual Visit No    Medication changes reported     No    Fall or balance concerns reported    No    Warm-up and Cool-down Performed on first and last piece of equipment    Resistance Training Performed Yes    VAD Patient? No    PAD/SET Patient? No      Pain Assessment   Currently in Pain? No/denies             Tobacco Use History[1]  Goals Met:  Independence with exercise equipment Exercise tolerated well No report of concerns or symptoms today Strength training completed today  Goals Unmet:  Not Applicable  Comments: Pt able to follow exercise prescription today without complaint.  Will continue to monitor for progression.    Dr. Oneil Pinal is Medical Director for [facility] Cardiac Rehabilitation.  Dr. Fuad Aleskerov is Medical Director for [facility] Pulmonary Rehabilitation.    [1]  Social History Tobacco Use  Smoking Status Every Day   Current packs/day: 1.50   Average packs/day: 1.5 packs/day for 54.6 years (82.0 ttl pk-yrs)   Types: Cigarettes   Start date: 03/27/1970  Smokeless Tobacco Never  Tobacco Comments   Smokes 1.5-2 packs per day

## 2024-11-17 ENCOUNTER — Other Ambulatory Visit: Payer: Self-pay | Admitting: Physician Assistant

## 2024-11-18 ENCOUNTER — Encounter

## 2024-11-18 DIAGNOSIS — Z48812 Encounter for surgical aftercare following surgery on the circulatory system: Secondary | ICD-10-CM | POA: Diagnosis not present

## 2024-11-18 DIAGNOSIS — Z952 Presence of prosthetic heart valve: Secondary | ICD-10-CM

## 2024-11-21 ENCOUNTER — Encounter

## 2024-11-22 ENCOUNTER — Telehealth: Payer: Self-pay

## 2024-11-22 NOTE — Telephone Encounter (Signed)
 Pt called back and I relayed info. He stated is OK with new time.

## 2024-11-23 ENCOUNTER — Encounter

## 2024-11-23 ENCOUNTER — Ambulatory Visit

## 2024-11-23 DIAGNOSIS — Z952 Presence of prosthetic heart valve: Secondary | ICD-10-CM

## 2024-11-23 NOTE — Progress Notes (Signed)
 Daily Session Note  Patient Details  Name: Chase [name] MRN: [id number] Date of Birth: [birth year] Referring Provider:   Flowsheet Row Cardiac Rehab from 09/22/2024 in [facility] Cardiac and Pulmonary Rehab  Referring Provider Dr. [name]    Encounter Date: 11/23/2024  Check In:  Session Check In - 11/23/24 1012       Check-In   Supervising physician immediately available to respond to emergencies See telemetry face sheet for immediately available ER MD    Location [facility]-Cardiac & Pulmonary Rehab    Staff Present Leita Franks RN,BSN;[facility] BS, Exercise Physiologist;Margaret Best, MS, Exercise Physiologist    Virtual Visit No    Medication changes reported     No    Fall or balance concerns reported    No    Warm-up and Cool-down Performed on first and last piece of equipment    Resistance Training Performed Yes    VAD Patient? No    PAD/SET Patient? No      Pain Assessment   Currently in Pain? No/denies             Tobacco Use History[1]  Goals Met:  Independence with exercise equipment Exercise tolerated well No report of concerns or symptoms today Strength training completed today  Goals Unmet:  Not Applicable  Comments: Pt able to follow exercise prescription today without complaint.  Will continue to monitor for progression.    Dr. Oneil Pinal is Medical Director for [facility] Cardiac Rehabilitation.  Dr. Fuad Aleskerov is Medical Director for [facility] Pulmonary Rehabilitation.    [1]  Social History Tobacco Use  Smoking Status Every Day   Current packs/day: 1.50   Average packs/day: 1.5 packs/day for 54.7 years (82.0 ttl pk-yrs)   Types: Cigarettes   Start date: 03/27/1970  Smokeless Tobacco Never  Tobacco Comments   Smokes 1.5-2 packs per day

## 2024-11-25 ENCOUNTER — Encounter

## 2024-11-25 DIAGNOSIS — Z952 Presence of prosthetic heart valve: Secondary | ICD-10-CM

## 2024-11-25 NOTE — Progress Notes (Signed)
 Daily Session Note  Patient Details  Name: Chase [name] MRN: [id number] Date of Birth: [birth year] Referring Provider:   Flowsheet Row Cardiac Rehab from 09/22/2024 in [facility] Cardiac and Pulmonary Rehab  Referring Provider Dr. [name]    Encounter Date: 11/25/2024  Check In:  Session Check In - 11/25/24 0915       Check-In   Supervising physician immediately available to respond to emergencies See telemetry face sheet for immediately available ER MD    Location [facility]-Cardiac & Pulmonary Rehab    Staff Present Burnard Davenport RN,BSN,MPA;Joseph Hood RCP,RRT,BSRT;Noah Tickle, [place], Exercise Physiologist;Maxon Conetta BS, Exercise Physiologist    Virtual Visit No    Medication changes reported     No    Fall or balance concerns reported    No    Warm-up and Cool-down Performed on first and last piece of equipment    Resistance Training Performed Yes    VAD Patient? No    PAD/SET Patient? No      Pain Assessment   Currently in Pain? No/denies             Tobacco Use History[1]  Goals Met:  Proper associated with RPD/PD & O2 Sat Independence with exercise equipment Exercise tolerated well No report of concerns or symptoms today Strength training completed today  Goals Unmet:  Not Applicable  Comments: Pt able to follow exercise prescription today without complaint.  Will continue to monitor for progression.    Dr. Oneil Pinal is Medical Director for [facility] Cardiac Rehabilitation.  Dr. Fuad Aleskerov is Medical Director for [facility] Pulmonary Rehabilitation.    [1]  Social History Tobacco Use  Smoking Status Every Day   Current packs/day: 1.50   Average packs/day: 1.5 packs/day for 54.7 years (82.0 ttl pk-yrs)   Types: Cigarettes   Start date: 03/27/1970  Smokeless Tobacco Never  Tobacco Comments   Smokes 1.5-2 packs per day

## 2024-11-28 ENCOUNTER — Encounter

## 2024-11-30 ENCOUNTER — Encounter

## 2024-12-02 ENCOUNTER — Encounter

## 2024-12-05 ENCOUNTER — Encounter

## 2024-12-07 ENCOUNTER — Encounter

## 2024-12-09 ENCOUNTER — Encounter

## 2024-12-12 ENCOUNTER — Encounter

## 2024-12-14 ENCOUNTER — Encounter

## 2024-12-16 ENCOUNTER — Encounter

## 2024-12-19 ENCOUNTER — Encounter

## 2024-12-21 ENCOUNTER — Encounter

## 2025-04-11 ENCOUNTER — Ambulatory Visit: Admitting: Physician Assistant
